# Patient Record
Sex: Female | Born: 1945
Health system: Southern US, Community
[De-identification: ages and names within clinical notes are randomized; demographics above are authoritative.]

## PROBLEM LIST (undated history)

## (undated) DIAGNOSIS — F0280 Dementia in other diseases classified elsewhere without behavioral disturbance: Secondary | ICD-10-CM

## (undated) DIAGNOSIS — H3410 Central retinal artery occlusion, unspecified eye: Secondary | ICD-10-CM

## (undated) DIAGNOSIS — S43429A Sprain of unspecified rotator cuff capsule, initial encounter: Secondary | ICD-10-CM

## (undated) DIAGNOSIS — D649 Anemia, unspecified: Secondary | ICD-10-CM

## (undated) DIAGNOSIS — Z789 Other specified health status: Secondary | ICD-10-CM

## (undated) DIAGNOSIS — H269 Unspecified cataract: Secondary | ICD-10-CM

## (undated) HISTORY — PX: OTHER SURGICAL HISTORY: SHX169

## (undated) HISTORY — PX: ANKLE SURGERY: SHX546

## (undated) HISTORY — DX: Central retinal artery occlusion, unspecified eye: H34.10

## (undated) HISTORY — DX: Unspecified cataract: H26.9

---

## 2004-02-14 ENCOUNTER — Ambulatory Visit (HOSPITAL_COMMUNITY): Admission: RE | Admit: 2004-02-14 | Discharge: 2004-02-14 | Payer: Self-pay | Admitting: General Surgery

## 2011-01-18 ENCOUNTER — Other Ambulatory Visit (HOSPITAL_COMMUNITY): Payer: Self-pay | Admitting: Family Medicine

## 2011-01-18 DIAGNOSIS — Z139 Encounter for screening, unspecified: Secondary | ICD-10-CM

## 2011-01-22 ENCOUNTER — Ambulatory Visit (HOSPITAL_COMMUNITY): Payer: Self-pay

## 2011-01-29 ENCOUNTER — Other Ambulatory Visit (HOSPITAL_COMMUNITY): Payer: Self-pay | Admitting: Family Medicine

## 2011-01-29 DIAGNOSIS — L02619 Cutaneous abscess of unspecified foot: Secondary | ICD-10-CM

## 2011-01-29 DIAGNOSIS — L03119 Cellulitis of unspecified part of limb: Secondary | ICD-10-CM

## 2011-01-31 ENCOUNTER — Inpatient Hospital Stay (HOSPITAL_COMMUNITY): Admission: RE | Admit: 2011-01-31 | Payer: Self-pay | Source: Ambulatory Visit

## 2013-12-17 ENCOUNTER — Emergency Department (HOSPITAL_COMMUNITY)
Admission: EM | Admit: 2013-12-17 | Discharge: 2013-12-17 | Disposition: A | Discharge status: Home / Self Care | Payer: 59 | Attending: Emergency Medicine | Admitting: Emergency Medicine

## 2013-12-17 ENCOUNTER — Encounter (HOSPITAL_COMMUNITY): Payer: Self-pay | Admitting: Emergency Medicine

## 2013-12-17 DIAGNOSIS — Z87828 Personal history of other (healed) physical injury and trauma: Secondary | ICD-10-CM | POA: Insufficient documentation

## 2013-12-17 DIAGNOSIS — M25571 Pain in right ankle and joints of right foot: Secondary | ICD-10-CM

## 2013-12-17 DIAGNOSIS — M25579 Pain in unspecified ankle and joints of unspecified foot: Secondary | ICD-10-CM | POA: Insufficient documentation

## 2013-12-17 MED ORDER — OXYCODONE-ACETAMINOPHEN 5-325 MG PO TABS: 1.0000 | ORAL_TABLET | ORAL | Status: DC | PRN

## 2013-12-17 MED ORDER — IBUPROFEN 800 MG PO TABS
800.0000 mg | ORAL_TABLET | Freq: Once | ORAL | Status: AC
  Administered 2013-12-17: 800 mg via ORAL
  Filled 2013-12-17: qty 1

## 2013-12-17 MED ORDER — OXYCODONE-ACETAMINOPHEN 5-325 MG PO TABS
2.0000 | ORAL_TABLET | Freq: Once | ORAL | Status: AC
  Administered 2013-12-17: 2 via ORAL
  Filled 2013-12-17: qty 2

## 2013-12-17 NOTE — ED Notes (Signed)
Pt reports she broke her ankle a couple of years ago, has trouble off and on with it when she is on her feet a lot, states she was on her feet all day Monday and thinks that is why she is having pain and increased swelling

## 2013-12-27 NOTE — ED Provider Notes (Signed)
CSN: [id number]     Arrival date & time 12/17/13  0425 History   First MD Initiated Contact with Patient 12/17/13 0429     No chief complaint on file.    (Consider location/radiation/quality/duration/timing/severity/associated sxs/prior Treatment) HPI  C69-year-old female with right ankle pain. Previously broken a few years ago. She occasionally has increased pain in the same ankle which is on her feet for extended periods of time. She was on her feet for extended period of time on Monday and she thinks this may have exacerbated things. No rash. No fevers or chills. No acute numbness or tingling.  History reviewed. No pertinent past medical history. History reviewed. No pertinent past surgical history. No family history on file. History  Substance Use Topics  . Smoking status: Never Smoker   . Smokeless tobacco: Not on file  . Alcohol Use: No   OB History   Grav Para Term Preterm Abortions TAB SAB Ect Mult Living                 Review of Systems  All systems reviewed and negative, other than as noted in HPI.   Allergies  Review of patient's allergies indicates no known allergies.  Home Medications   Prior to Admission medications   Medication Sig Start Date End Date Taking? Authorizing Provider  oxyCODONE-acetaminophen (PERCOCET/ROXICET) 5-325 MG per tablet Take 1-2 tablets by mouth every 4 (four) hours as needed for severe pain. 12/17/13   Raeford Razor, MD   BP 121/55  Pulse 69  Resp 18  SpO2 100% Physical Exam  Nursing note and vitals reviewed. Constitutional: She appears well-developed and well-nourished. No distress.  HENT:  Head: Normocephalic and atraumatic.  Eyes: Conjunctivae are normal. Right eye exhibits no discharge. Left eye exhibits no discharge.  Neck: Neck supple.  Cardiovascular: Normal rate, regular rhythm and normal heart sounds.  Exam reveals no gallop and no friction rub.   No murmur heard. Pulmonary/Chest: Effort normal and breath sounds normal.  No respiratory distress.  Abdominal: Soft. She exhibits no distension. There is no tenderness.  Musculoskeletal: She exhibits no edema and no tenderness.  R foot/ankle with no discrete areas of tenderness. Able to actively and passively ranged ankle without apparent significant pain. Sensation is intact to light touch. Easily palpable DP pulse. Skin is warm and appears well perfused. Mild diffuse swelling of R foot/ankle but otherwse no skin changes to suggest infection.  Neurological: She is alert.  Skin: Skin is warm and dry.  Psychiatric: She has a normal mood and affect. Her behavior is normal. Thought content normal.    ED Course  Procedures (including critical care time) Labs Review Labs Reviewed - No data to display  Imaging Review No results found.   EKG Interpretation None      MDM   Final diagnoses:  Right ankle pain    68 year old female with right ankle pain. Suspect from overuse. Previous ankle injury, but no acute trauma. NVI. Plan symptomatic treatment.  Raeford Razor, MD 12/27/13 [phone]

## 2014-02-01 ENCOUNTER — Emergency Department (HOSPITAL_COMMUNITY)
Admission: EM | Admit: 2014-02-01 | Discharge: 2014-02-01 | Disposition: A | Discharge status: Home / Self Care | Payer: 59 | Attending: Emergency Medicine | Admitting: Emergency Medicine

## 2014-02-01 ENCOUNTER — Encounter (HOSPITAL_COMMUNITY): Payer: Self-pay | Admitting: Emergency Medicine

## 2014-02-01 DIAGNOSIS — Z8781 Personal history of (healed) traumatic fracture: Secondary | ICD-10-CM | POA: Insufficient documentation

## 2014-02-01 DIAGNOSIS — Z9889 Other specified postprocedural states: Secondary | ICD-10-CM | POA: Insufficient documentation

## 2014-02-01 DIAGNOSIS — M25471 Effusion, right ankle: Secondary | ICD-10-CM

## 2014-02-01 DIAGNOSIS — M25476 Effusion, unspecified foot: Principal | ICD-10-CM | POA: Insufficient documentation

## 2014-02-01 DIAGNOSIS — M25473 Effusion, unspecified ankle: Secondary | ICD-10-CM | POA: Insufficient documentation

## 2014-02-01 MED ORDER — TRAMADOL HCL 50 MG PO TABS: 50.0000 mg | ORAL_TABLET | Freq: Four times a day (QID) | ORAL | Status: DC | PRN

## 2014-02-01 NOTE — ED Provider Notes (Addendum)
CSN: [id number]     Arrival date & time 02/01/14  4388 History   First MD Initiated Contact with Patient 02/01/14 [phone]     Chief Complaint  Patient presents with  . Right Ankle Swelling      (Consider location/radiation/quality/duration/timing/severity/associated sxs/prior Treatment) HPI Comments: The pt is a 68 y/o female who has had swelling intermittently in her R ankle since fracturing the ankle and resultant surgery in the past - she was seen a month ago for atraumatic swelling - she is back today because 3 days ago the sweling came back - she has a job that requires that she be on her feet all day - she has no fevers, no redness and denies injury - she states this always occurs - she has been using a compression sock without relief and ibuprofen without relief.  The history is provided by the patient.    History reviewed. No pertinent past medical history. History reviewed. No pertinent past surgical history. History reviewed. No pertinent family history. History  Substance Use Topics  . Smoking status: Never Smoker   . Smokeless tobacco: Not on file  . Alcohol Use: No   OB History   Grav Para Term Preterm Abortions TAB SAB Ect Mult Living                 Review of Systems  Gastrointestinal: Negative for nausea and vomiting.  Musculoskeletal: Positive for joint swelling ( ankle). Negative for back pain and neck pain.  Neurological: Negative for weakness and numbness.      Allergies  Review of patient's allergies indicates no known allergies.  Home Medications   Prior to Admission medications   Medication Sig Start Date End Date Taking? Authorizing Provider  oxyCODONE-acetaminophen (PERCOCET/ROXICET) 5-325 MG per tablet Take 1-2 tablets by mouth every 4 (four) hours as needed for severe pain. 12/17/13   Raeford Razor, MD  traMADol (ULTRAM) 50 MG tablet Take 1 tablet (50 mg total) by mouth every 6 (six) hours as needed. 02/01/14   Vida Roller, MD   BP 114/55  Pulse  71  Temp(Src) 97.1 F (36.2 C) (Oral)  Resp 16  Ht 5\' 1"  (1.549 m)  Wt 147 lb (66.679 kg)  BMI 27.79 kg/m2  SpO2 96% Physical Exam  Nursing note and vitals reviewed. Constitutional: She appears well-developed and well-nourished. No distress.  HENT:  Head: Normocephalic and atraumatic.  Eyes: Conjunctivae are normal. No scleral icterus.  Cardiovascular: Normal rate, regular rhythm and intact distal pulses.   Pulmonary/Chest: Effort normal and breath sounds normal.  Musculoskeletal: She exhibits tenderness ( ttp around the R ankle and across the top of the foot. with assocaited mild swelling compared to L foot and ankle). She exhibits no edema.  Neurological: She is alert.  Skin: Skin is warm and dry. No rash noted. She is not diaphoretic.    ED Course  Procedures (including critical care time) Labs Review Labs Reviewed - No data to display  Imaging Review No results found.    MDM   Final diagnoses:  Ankle swelling, right    Ankle unlikely to be fractured - ongoing swelling - ASO applied - RICE therapy recommended for home and to stay off of foot X 24 hours - ultram to augment the nsaids.  Pt in agreement.  Meds given in ED:  Medications - No data to display  New Prescriptions   TRAMADOL (ULTRAM) 50 MG TABLET    Take 1 tablet (50 mg total) by mouth  every 6 (six) hours as needed.      Vida RollerBrian D Jahden Schara, MD 02/01/14 [phone]  Vida RollerBrian D Jacy Howat, MD 02/01/14 [phone]

## 2014-02-01 NOTE — ED Notes (Signed)
Pt has c/o of right ankle swelling since Friday.

## 2014-02-01 NOTE — Discharge Instructions (Signed)
Please stay off your feet for 24 hours as much as possible - see RICE instructions.  Tramadol for pain not relieved with ibuprofen.  Please call your doctor for a followup appointment within 24-48 hours. When you talk to your doctor please let them know that you were seen in the emergency department and have them acquire all of your records so that they can discuss the findings with you and formulate a treatment plan to fully care for your new and ongoing problems.

## 2016-08-10 ENCOUNTER — Encounter (HOSPITAL_COMMUNITY): Payer: Self-pay | Admitting: *Deleted

## 2016-08-10 ENCOUNTER — Emergency Department (HOSPITAL_COMMUNITY)
Admission: EM | Admit: 2016-08-10 | Discharge: 2016-08-10 | Disposition: A | Discharge status: Home / Self Care | Payer: 59 | Attending: Emergency Medicine | Admitting: Emergency Medicine

## 2016-08-10 ENCOUNTER — Emergency Department (HOSPITAL_COMMUNITY): Payer: 59

## 2016-08-10 DIAGNOSIS — J208 Acute bronchitis due to other specified organisms: Secondary | ICD-10-CM | POA: Diagnosis not present

## 2016-08-10 DIAGNOSIS — R079 Chest pain, unspecified: Secondary | ICD-10-CM | POA: Diagnosis not present

## 2016-08-10 DIAGNOSIS — R05 Cough: Secondary | ICD-10-CM | POA: Diagnosis not present

## 2016-08-10 MED ORDER — ALBUTEROL SULFATE HFA 108 (90 BASE) MCG/ACT IN AERS
2.0000 | INHALATION_SPRAY | Freq: Once | RESPIRATORY_TRACT | Status: AC
  Administered 2016-08-10: 2 via RESPIRATORY_TRACT
  Filled 2016-08-10: qty 6.7

## 2016-08-10 MED ORDER — DOXYCYCLINE HYCLATE 100 MG PO TABS
100.0000 mg | ORAL_TABLET | Freq: Once | ORAL | Status: AC
  Administered 2016-08-10: 100 mg via ORAL
  Filled 2016-08-10: qty 1

## 2016-08-10 MED ORDER — HYDROCOD POLST-CPM POLST ER 10-8 MG/5ML PO SUER
5.0000 mL | Freq: Once | ORAL | Status: AC
  Administered 2016-08-10: 5 mL via ORAL
  Filled 2016-08-10: qty 5

## 2016-08-10 MED ORDER — DOXYCYCLINE HYCLATE 100 MG PO CAPS: 100.0000 mg | ORAL_CAPSULE | Freq: Two times a day (BID) | ORAL | 0 refills | Status: DC

## 2016-08-10 MED ORDER — HYDROCODONE-HOMATROPINE 5-1.5 MG/5ML PO SYRP: 5.0000 mL | ORAL_SOLUTION | Freq: Four times a day (QID) | ORAL | 0 refills | Status: DC | PRN

## 2016-08-10 NOTE — Discharge Instructions (Signed)
Your chest xray suggest bronchitis, but no pneumonia. Your oxygen level is 100%. Please increase fluids. Rest as much as possible. Use 2 puffs of albuterol every 4 hours for cough, wheezing, and congestion. Use doxycycline 2 times daily with a meal. Use hycodan for cough. This medication may cause drowsiness. Use with caution. Use tylenol, and/or ibuprofen for shoulder pain. Heating pad to the shoulder may also be helpful.

## 2016-08-10 NOTE — ED Triage Notes (Signed)
Pt comes in with cough and congestion starting 3 days ago. Pt states she has a productive cough with green mucus. Pt is having right shoulder pain that started the day after the cough. Pain worsens with movement. Denies any chest pain.

## 2016-08-10 NOTE — ED Provider Notes (Signed)
[facility]EMER[facility]ENCY DEPT Provider Note   CSN: [id number] Arrival date & time: 08/10/16  6629     History   Chief Complaint Chief Complaint  Patient presents with  . Cough    HPI Wendy Mccann is a 71 y.o. female.  Patient is a 71 year old female who presents to the emergency department with a complaint of cough.  The patient reports a three-day history of cough and congestion. Her cough is productive, usually with greenish mucus. Patient states she has coughed so much that she now has pain involving her right shoulder. She presents today because the pain is getting worse, and the cough does not seem to be getting better she has tried some over-the-counter medications, but these have not been effective. She's not measured a temperature elevation, she has had a few body aches on. She has not been coughing up any blood. There's been no recent operations or procedures involving her lungs. She is not a smoker, and does not have a lung related illness that she is aware of.      History reviewed. No pertinent past medical history.  There are no active problems to display for this patient.   History reviewed. No pertinent surgical history.  OB History    No data available       Home Medications    Prior to Admission medications   Medication Sig Start Date End Date Taking? Authorizing Provider  oxyCODONE-acetaminophen (PERCOCET/ROXICET) 5-325 MG per tablet Take 1-2 tablets by mouth every 4 (four) hours as needed for severe pain. 12/17/13   Raeford Razor, MD  traMADol (ULTRAM) 50 MG tablet Take 1 tablet (50 mg total) by mouth every 6 (six) hours as needed. 02/01/14   Eber Hong, MD    Family History No family history on file.  Social History Social History  Substance Use Topics  . Smoking status: Never Smoker  . Smokeless tobacco: Never Used  . Alcohol use No     Allergies   Patient has no known allergies.   Review of Systems Review of Systems  Constitutional:  Negative for activity change.       All ROS Neg except as noted in HPI  HENT: Positive for congestion. Negative for nosebleeds.   Eyes: Negative for photophobia and discharge.  Respiratory: Positive for cough. Negative for shortness of breath and wheezing.   Cardiovascular: Negative for chest pain and palpitations.  Gastrointestinal: Negative for abdominal pain and blood in stool.  Genitourinary: Negative for dysuria, frequency and hematuria.  Musculoskeletal: Positive for myalgias. Negative for arthralgias, back pain and neck pain.  Skin: Negative.   Neurological: Negative for dizziness, seizures and speech difficulty.  Psychiatric/Behavioral: Negative for confusion and hallucinations.     Physical Exam Updated Vital Signs BP 122/55 (BP Location: Left Arm)   Pulse 69   Temp 97.9 F (36.6 C) (Oral)   Resp 20   Ht 5\' 1"  (1.549 m)   Wt 55.3 kg   SpO2 98%   BMI 23.05 kg/m   Physical Exam  Constitutional: She is oriented to person, place, and time. She appears well-developed and well-nourished.  Non-toxic appearance.  HENT:  Head: Normocephalic.  Right Ear: Tympanic membrane and external ear normal.  Left Ear: Tympanic membrane and external ear normal.  Eyes: EOM and lids are normal. Pupils are equal, round, and reactive to light.  Neck: Normal range of motion. Neck supple. Carotid bruit is not present.  Cardiovascular: Normal rate, regular rhythm, normal heart sounds, intact distal pulses and  normal pulses.   Pulmonary/Chest: Breath sounds normal. No respiratory distress.  Course breath sounds with occasional rhonchi present. Symmetrical rise and fall of the chest. Patient speaks in complete sentences without problem.  Abdominal: Soft. Bowel sounds are normal. There is no tenderness. There is no guarding.  Musculoskeletal: Normal range of motion.  Lymphadenopathy:       Head (right side): No submandibular adenopathy present.       Head (left side): No submandibular adenopathy  present.    She has no cervical adenopathy.  Neurological: She is alert and oriented to person, place, and time. She has normal strength. No cranial nerve deficit or sensory deficit.  Skin: Skin is warm and dry.  Psychiatric: She has a normal mood and affect. Her speech is normal.  Nursing note and vitals reviewed.    ED Treatments / Results  Labs (all labs ordered are listed, but only abnormal results are displayed) Labs Reviewed - No data to display  EKG  EKG Interpretation None       Radiology Dg Chest 2 View  Result Date: 08/10/2016 CLINICAL DATA:  Cough, right shoulder and posterior chest pain for 4 days EXAM: CHEST  2 VIEW COMPARISON:  None. FINDINGS: Heart and mediastinal contours are within normal limits. No focal opacities or effusions. No acute bony abnormality. Mild peribronchial thickening IMPRESSION: Mild bronchitic changes. Electronically Signed   By: Charlett Nose M.D.   On: 08/10/2016 09:35    Procedures Procedures (including critical care time)  Medications Ordered in ED Medications - No data to display   Initial Impression / Assessment and Plan / ED Course  I have reviewed the triage vital signs and the nursing notes.  Pertinent labs & imaging results that were available during my care of the patient were reviewed by me and considered in my medical decision making (see chart for details).     **I have reviewed nursing notes, vital signs, and all appropriate lab and imaging results for this patient.*  Final Clinical Impressions(s) / ED Diagnoses MDM Vital signs within normal limits. Pulse oximetry is 100% on room air. No high fevers reported. No history of pulmonary illness. The chest x-ray shows some bronchitis changes, but no opacity or pneumonia. Suspect the patient has acute bronchitis and possible upper respiratory infection. The patient will be treated with doxycycline, Hycodan, and albuterol inhaler. I've advised patient to wash hands frequently, as  well as to increase by mouth fluids. The patient is in agreement with this plan. She will return if not improving.    Final diagnoses:  Acute bronchitis due to other specified organisms    New Prescriptions New Prescriptions   No medications on file     Ivery Quale, PA-C 08/10/16 1119    Donnetta Hutching, MD 08/13/16 [phone]

## 2016-08-12 ENCOUNTER — Encounter (HOSPITAL_COMMUNITY): Payer: Self-pay | Admitting: Emergency Medicine

## 2016-08-12 ENCOUNTER — Observation Stay (HOSPITAL_COMMUNITY)
Admission: EM | Admit: 2016-08-12 | Discharge: 2016-08-13 | Disposition: A | Discharge status: Home / Self Care | Payer: 59 | Attending: Internal Medicine | Admitting: Internal Medicine

## 2016-08-12 ENCOUNTER — Emergency Department (HOSPITAL_COMMUNITY): Payer: 59

## 2016-08-12 DIAGNOSIS — Y999 Unspecified external cause status: Secondary | ICD-10-CM | POA: Insufficient documentation

## 2016-08-12 DIAGNOSIS — R109 Unspecified abdominal pain: Secondary | ICD-10-CM | POA: Diagnosis not present

## 2016-08-12 DIAGNOSIS — R059 Cough, unspecified: Secondary | ICD-10-CM | POA: Diagnosis present

## 2016-08-12 DIAGNOSIS — S46911A Strain of unspecified muscle, fascia and tendon at shoulder and upper arm level, right arm, initial encounter: Secondary | ICD-10-CM | POA: Diagnosis not present

## 2016-08-12 DIAGNOSIS — R531 Weakness: Secondary | ICD-10-CM

## 2016-08-12 DIAGNOSIS — R1030 Lower abdominal pain, unspecified: Secondary | ICD-10-CM | POA: Diagnosis not present

## 2016-08-12 DIAGNOSIS — E860 Dehydration: Secondary | ICD-10-CM | POA: Diagnosis present

## 2016-08-12 DIAGNOSIS — Z79899 Other long term (current) drug therapy: Secondary | ICD-10-CM | POA: Diagnosis not present

## 2016-08-12 DIAGNOSIS — Y929 Unspecified place or not applicable: Secondary | ICD-10-CM | POA: Insufficient documentation

## 2016-08-12 DIAGNOSIS — S4991XA Unspecified injury of right shoulder and upper arm, initial encounter: Secondary | ICD-10-CM | POA: Diagnosis present

## 2016-08-12 DIAGNOSIS — X58XXXA Exposure to other specified factors, initial encounter: Secondary | ICD-10-CM | POA: Insufficient documentation

## 2016-08-12 DIAGNOSIS — I951 Orthostatic hypotension: Secondary | ICD-10-CM | POA: Diagnosis present

## 2016-08-12 DIAGNOSIS — Y9389 Activity, other specified: Secondary | ICD-10-CM | POA: Insufficient documentation

## 2016-08-12 DIAGNOSIS — B349 Viral infection, unspecified: Secondary | ICD-10-CM | POA: Diagnosis not present

## 2016-08-12 DIAGNOSIS — R05 Cough: Secondary | ICD-10-CM | POA: Diagnosis not present

## 2016-08-12 DIAGNOSIS — R52 Pain, unspecified: Secondary | ICD-10-CM

## 2016-08-12 DIAGNOSIS — M25511 Pain in right shoulder: Secondary | ICD-10-CM | POA: Diagnosis not present

## 2016-08-12 HISTORY — DX: Other specified health status: Z78.9

## 2016-08-12 LAB — URINALYSIS, ROUTINE W REFLEX MICROSCOPIC
Bilirubin Urine: NEGATIVE
Glucose, UA: NEGATIVE mg/dL
Ketones, ur: 20 mg/dL — AB
Leukocytes, UA: NEGATIVE
Nitrite: NEGATIVE
Protein, ur: NEGATIVE mg/dL
Specific Gravity, Urine: 1.012 (ref 1.005–1.030)
pH: 6 (ref 5.0–8.0)

## 2016-08-12 LAB — CBC WITH DIFFERENTIAL/PLATELET
Basophils Absolute: 0 10*3/uL (ref 0.0–0.1)
Basophils Relative: 0 %
Eosinophils Absolute: 0 10*3/uL (ref 0.0–0.7)
Eosinophils Relative: 0 %
HCT: 37.2 % (ref 36.0–46.0)
Hemoglobin: 12.5 g/dL (ref 12.0–15.0)
Lymphocytes Relative: 17 %
Lymphs Abs: 1.8 10*3/uL (ref 0.7–4.0)
MCH: 32.4 pg (ref 26.0–34.0)
MCHC: 33.6 g/dL (ref 30.0–36.0)
MCV: 96.4 fL (ref 78.0–100.0)
Monocytes Absolute: 1.6 10*3/uL — ABNORMAL HIGH (ref 0.1–1.0)
Monocytes Relative: 15 %
Neutro Abs: 7.3 10*3/uL (ref 1.7–7.7)
Neutrophils Relative %: 68 %
Platelets: 296 10*3/uL (ref 150–400)
RBC: 3.86 MIL/uL — ABNORMAL LOW (ref 3.87–5.11)
RDW: 12.5 % (ref 11.5–15.5)
WBC: 10.8 10*3/uL — ABNORMAL HIGH (ref 4.0–10.5)

## 2016-08-12 LAB — COMPREHENSIVE METABOLIC PANEL
ALT: 13 U/L — ABNORMAL LOW (ref 14–54)
AST: 19 U/L (ref 15–41)
Albumin: 3.8 g/dL (ref 3.5–5.0)
Alkaline Phosphatase: 68 U/L (ref 38–126)
Anion gap: 10 (ref 5–15)
BUN: 10 mg/dL (ref 6–20)
CO2: 26 mmol/L (ref 22–32)
Calcium: 9.8 mg/dL (ref 8.9–10.3)
Chloride: 97 mmol/L — ABNORMAL LOW (ref 101–111)
Creatinine, Ser: 0.74 mg/dL (ref 0.44–1.00)
GFR calc Af Amer: >60 mL/min (ref >60)
GFR calc non Af Amer: >60 mL/min (ref >60)
Glucose, Bld: 103 mg/dL — ABNORMAL HIGH (ref 65–99)
Potassium: 3.6 mmol/L (ref 3.5–5.1)
Sodium: 133 mmol/L — ABNORMAL LOW (ref 135–145)
Total Bilirubin: 1.8 mg/dL — ABNORMAL HIGH (ref 0.3–1.2)
Total Protein: 8 g/dL (ref 6.5–8.1)

## 2016-08-12 LAB — LIPASE, BLOOD: Lipase: 20 U/L (ref 11–51)

## 2016-08-12 LAB — LACTIC ACID, PLASMA
Lactic Acid, Venous: 0.8 mmol/L (ref 0.5–1.9)
Lactic Acid, Venous: 1 mmol/L (ref 0.5–1.9)

## 2016-08-12 LAB — INFLUENZA PANEL BY PCR (TYPE A & B)
Influenza A By PCR: NEGATIVE
Influenza B By PCR: NEGATIVE

## 2016-08-12 LAB — TROPONIN I: Troponin I: <0.03 ng/mL (ref <0.03)

## 2016-08-12 MED ORDER — IOPAMIDOL (ISOVUE-300) INJECTION 61%
100.0000 mL | Freq: Once | INTRAVENOUS | Status: AC | PRN
  Administered 2016-08-12: 100 mL via INTRAVENOUS

## 2016-08-12 MED ORDER — SODIUM CHLORIDE 0.9 % IV BOLUS (SEPSIS)
500.0000 mL | Freq: Once | INTRAVENOUS | Status: AC
  Administered 2016-08-12: 500 mL via INTRAVENOUS

## 2016-08-12 MED ORDER — ONDANSETRON HCL 4 MG/2ML IJ SOLN
4.0000 mg | INTRAMUSCULAR | Status: DC | PRN
  Administered 2016-08-12: 4 mg via INTRAVENOUS
  Filled 2016-08-12 (×2): qty 2

## 2016-08-12 MED ORDER — SODIUM CHLORIDE 0.9 % IV BOLUS (SEPSIS)
1000.0000 mL | Freq: Once | INTRAVENOUS | Status: AC
  Administered 2016-08-12: 1000 mL via INTRAVENOUS

## 2016-08-12 MED ORDER — ALBUTEROL SULFATE HFA 108 (90 BASE) MCG/ACT IN AERS
4.0000 | INHALATION_SPRAY | RESPIRATORY_TRACT | Status: AC
  Administered 2016-08-12: 4 via RESPIRATORY_TRACT
  Filled 2016-08-12: qty 6.7

## 2016-08-12 MED ORDER — MORPHINE SULFATE (PF) 4 MG/ML IV SOLN
4.0000 mg | INTRAVENOUS | Status: DC | PRN
  Administered 2016-08-12: 4 mg via INTRAVENOUS
  Filled 2016-08-12 (×2): qty 1

## 2016-08-12 NOTE — ED Provider Notes (Signed)
[facility]PT Provider Note   CSN: [id number] Arrival date & time: 08/12/16  1538     History   Chief Complaint Chief Complaint  Patient presents with  . Shoulder Pain  . Abdominal Pain    HPI Wendy Mccann is a 71 y.o. female.   Shoulder Pain    Abdominal Pain      Pt was seen at 1550.  Per pt, c/o gradual onset and persistence of constant lower abd "pain" for the past 2 to 3 days. Has been associated with generalized weakness. Pt also c/o ongoing cough, congestion and right shoulder "pain" for the past 1 week. Pt states her shoulder pain began after "coughing so much." Pain worsens with palpation of the area and movement of her shoulder. Pt is right handed. Pt was evaluated in the ED 2 days ago for these symptoms, dx URI, rx MDI with improvement of cough. Pt states her shoulder pain continues despite taking the pain meds prescribed. Denies N/V, no diarrhea, no fevers, no rash, no neck pain, no back pain, no dysuria, no rash, no CP/SOB, no black or blood in stools or emesis.      History reviewed. No pertinent past medical history.  There are no active problems to display for this patient.   Past Surgical History:  Procedure Laterality Date  . ankles      OB History    Gravida Para Term Preterm AB Living   0 0 0 0 0 0   SAB TAB Ectopic Multiple Live Births   0 0 0 0 0       Home Medications    Prior to Admission medications   Medication Sig Start Date End Date Taking? Authorizing Provider  doxycycline (VIBRAMYCIN) 100 MG capsule Take 1 capsule (100 mg total) by mouth 2 (two) times daily. 08/10/16   Ivery QualeHobson Bryant, PA-C  HYDROcodone-homatropine (HYCODAN) 5-1.5 MG/5ML syrup Take 5 mLs by mouth every 6 (six) hours as needed. 08/10/16   Ivery QualeHobson Bryant, PA-C  oxyCODONE-acetaminophen (PERCOCET/ROXICET) 5-325 MG per tablet Take 1-2 tablets by mouth every 4 (four) hours as needed for severe pain. 12/17/13   Raeford RazorStephen Kohut, MD  traMADol (ULTRAM) 50 MG tablet Take 1  tablet (50 mg total) by mouth every 6 (six) hours as needed. 02/01/14   Eber HongBrian Miller, MD    Family History History reviewed. No pertinent family history.  Social History Social History  Substance Use Topics  . Smoking status: Never Smoker  . Smokeless tobacco: Never Used  . Alcohol use No     Allergies   Patient has no known allergies.   Review of Systems Review of Systems  Gastrointestinal: Positive for abdominal pain.  ROS: Statement: All systems negative except as marked or noted in the HPI; Constitutional: Negative for fever and chills. +generalized weakness. ; ; Eyes: Negative for eye pain, redness and discharge. ; ; ENMT: Negative for ear pain, hoarseness, nasal congestion, sinus pressure and sore throat. ; ; Cardiovascular: Negative for chest pain, palpitations, diaphoresis, dyspnea and peripheral edema. ; ; Respiratory: +cough. Negative for wheezing and stridor. ; ; Gastrointestinal: +abd pain. Negative for nausea, vomiting, diarrhea, blood in stool, hematemesis, jaundice and rectal bleeding. . ; ; Genitourinary: Negative for dysuria, flank pain and hematuria. ; ; Musculoskeletal: +right shoulder pain. Negative for back pain and neck pain. Negative for swelling and direct trauma.; ; Skin: Negative for pruritus, rash, abrasions, blisters, bruising and skin lesion.; ; Neuro: Negative for headache, lightheadedness and neck stiffness. Negative for  weakness, altered level of consciousness, altered mental status, extremity weakness, paresthesias, involuntary movement, seizure and syncope.      Physical Exam Updated Vital Signs BP (!) 117/52 (BP Location: Left Arm)   Pulse 84   Temp 98.1 F (36.7 C) (Oral)   Resp 16   Ht 5\' 1"  (1.549 m)   Wt 122 lb (55.3 kg)   SpO2 100%   BMI 23.05 kg/m    Patient Vitals for the past 24 hrs:  BP Temp Temp src Pulse Resp SpO2 Height Weight  08/12/16 2122 - - - - - 97 % - -  08/12/16 2045 - - - 81 16 93 % - -  08/12/16 2030 113/69 - - 83 15  93 % - -  08/12/16 2015 - - - 78 13 98 % - -  08/12/16 2000 118/66 - - - 20 - - -  08/12/16 1930 (!) 95/53 - - 87 20 94 % - -  08/12/16 1913 118/71 - - 85 18 93 % - -  08/12/16 1845 - - - 85 19 92 % - -  08/12/16 1830 - - - 87 24 94 % - -  08/12/16 1815 - - - 82 - 100 % - -  08/12/16 1745 - - - - 20 - - -  08/12/16 1730 - - - 82 19 100 % - -  08/12/16 1715 - - - 84 18 97 % - -  08/12/16 1645 - - - 81 23 97 % - -  08/12/16 1630 - - - 80 22 96 % - -  08/12/16 1541 (!) 117/52 98.1 F (36.7 C) Oral 84 16 100 % 5\' 1"  (1.549 m) 122 lb (55.3 kg)   17:14 Orthostatic Vital Signs Intiial DF  Orthostatic Lying   BP- Lying: 111/61  Pulse- Lying: 75      Orthostatic Sitting  BP- Sitting: 117/68  Pulse- Sitting: 82      Orthostatic Standing at 0 minutes  BP- Standing at 0 minutes: 95/66  Pulse- Standing at 0 minutes: 88   19:50 Orthostatic Vital Signs after IVF bolus AH  Orthostatic Lying   BP- Lying: 102/48  Pulse- Lying: 81      Orthostatic Sitting  BP- Sitting:  85/74  Pulse- Sitting: 86      Orthostatic Standing at 0 minutes  BP- Standing at 0 minutes: 99/77  Pulse- Standing at 0 minutes: 85      Physical Exam 1555: Physical examination:  Nursing notes reviewed; Vital signs and O2 SAT reviewed;  Constitutional: Well developed, Well nourished, Uncomfortable appearing.; Head:  Normocephalic, atraumatic; Eyes: EOMI, PERRL, No scleral icterus; ENMT: Mouth and pharynx normal, Mucous membranes dry; Neck: Supple, Full range of motion, No lymphadenopathy; Cardiovascular: Regular rate and rhythm, No gallop; Respiratory: Breath sounds clear & equal bilaterally, No wheezes.  Speaking full sentences with ease, Normal respiratory effort/excursion; Chest: Nontender, Movement normal; Abdomen: Soft, +RLQ, LLQ tender to palp. Nondistended, Normal bowel sounds; Genitourinary: No CVA tenderness; Spine:  No midline CS, TS, LS tenderness. +TTP right hypertonic trapezius muscle. No rash.;;  Extremities: Pulses normal, Right shoulder w/decreased ROM due to pain.  +TTP entire shoulder, esp AC joint. Right clavicle NT, scapula NT, proximal humerus NT, biceps tendon NT over bicipital groove. No deformity, no rash, no edema.  Motor strength at shoulder normal.  Sensation intact over deltoid region, distal NMS intact with right hand having intact and equal sensation and strength in the distribution of the median, radial, and ulnar  nerve function compared to opposite side.  Strong radial pulse.  +FROM right elbow with intact motor strength biceps and triceps muscles to resistance. NT right elbow/wrist/hand. No edema, No calf edema or asymmetry.; Neuro: AA&Ox3, Major CN grossly intact.  Speech clear. No gross focal motor or sensory deficits in extremities.; Skin: Color normal, Warm, Dry.   ED Treatments / Results  Labs (all labs ordered are listed, but only abnormal results are displayed)   EKG  EKG Interpretation None       Radiology   Procedures Procedures (including critical care time)  Medications Ordered in ED Medications - No data to display   Initial Impression / Assessment and Plan / ED Course  I have reviewed the triage vital signs and the nursing notes.  Pertinent labs & imaging results that were available during my care of the patient were reviewed by me and considered in my medical decision making (see chart for details).  MDM Reviewed: previous chart, nursing note and vitals Reviewed previous: labs, ECG and x-ray Interpretation: labs, ECG, x-ray and CT scan   Results for orders placed or performed during the hospital encounter of 08/12/16  Comprehensive metabolic panel  Result Value Ref Range   Sodium 133 (L) 135 - 145 mmol/L   Potassium 3.6 3.5 - 5.1 mmol/L   Chloride 97 (L) 101 - 111 mmol/L   CO2 26 22 - 32 mmol/L   Glucose, Bld 103 (H) 65 - 99 mg/dL   BUN 10 6 - 20 mg/dL   Creatinine, Ser 1.61 0.44 - 1.00 mg/dL   Calcium 9.8 8.9 - 09.6 mg/dL    Total Protein 8.0 6.5 - 8.1 g/dL   Albumin 3.8 3.5 - 5.0 g/dL   AST 19 15 - 41 U/L   ALT 13 (L) 14 - 54 U/L   Alkaline Phosphatase 68 38 - 126 U/L   Total Bilirubin 1.8 (H) 0.3 - 1.2 mg/dL   GFR calc non Af Amer >60 >60 mL/min   GFR calc Af Amer >60 >60 mL/min   Anion gap 10 5 - 15  Lipase, blood  Result Value Ref Range   Lipase 20 11 - 51 U/L  Troponin I  Result Value Ref Range   Troponin I <0.03 <0.03 ng/mL  Lactic acid, plasma  Result Value Ref Range   Lactic Acid, Venous 0.8 0.5 - 1.9 mmol/L  Lactic acid, plasma  Result Value Ref Range   Lactic Acid, Venous 1.0 0.5 - 1.9 mmol/L  CBC with Differential  Result Value Ref Range   WBC 10.8 (H) 4.0 - 10.5 K/uL   RBC 3.86 (L) 3.87 - 5.11 MIL/uL   Hemoglobin 12.5 12.0 - 15.0 g/dL   HCT 04.5 40.9 - 81.1 %   MCV 96.4 78.0 - 100.0 fL   MCH 32.4 26.0 - 34.0 pg   MCHC 33.6 30.0 - 36.0 g/dL   RDW 91.4 78.2 - 95.6 %   Platelets 296 150 - 400 K/uL   Neutrophils Relative % 68 %   Neutro Abs 7.3 1.7 - 7.7 K/uL   Lymphocytes Relative 17 %   Lymphs Abs 1.8 0.7 - 4.0 K/uL   Monocytes Relative 15 %   Monocytes Absolute 1.6 (H) 0.1 - 1.0 K/uL   Eosinophils Relative 0 %   Eosinophils Absolute 0.0 0.0 - 0.7 K/uL   Basophils Relative 0 %   Basophils Absolute 0.0 0.0 - 0.1 K/uL  Urinalysis, Routine w reflex microscopic  Result Value Ref Range   Color,  Urine YELLOW YELLOW   APPearance CLEAR CLEAR   Specific Gravity, Urine 1.012 1.005 - 1.030   pH 6.0 5.0 - 8.0   Glucose, UA NEGATIVE NEGATIVE mg/dL   Hgb urine dipstick SMALL (A) NEGATIVE   Bilirubin Urine NEGATIVE NEGATIVE   Ketones, ur 20 (A) NEGATIVE mg/dL   Protein, ur NEGATIVE NEGATIVE mg/dL   Nitrite NEGATIVE NEGATIVE   Leukocytes, UA NEGATIVE NEGATIVE   RBC / HPF 0-5 0 - 5 RBC/hpf   WBC, UA 0-5 0 - 5 WBC/hpf   Bacteria, UA RARE (A) NONE SEEN   Mucous PRESENT    Dg Chest 2 View Result Date: 08/12/2016 CLINICAL DATA:  Abdominal pain and cough.  Shoulder pain. EXAM: CHEST  2  VIEW COMPARISON:  None. FINDINGS: The heart size and mediastinal contours are within normal limits. Both lungs are clear. Mild to moderate thoracic spondylosis. No pleural effusion. IMPRESSION: 1.  No active cardiopulmonary disease is radiographically apparent. 2. Thoracic spondylosis. Electronically Signed   By: Gaylyn Rong M.D.   On: 08/12/2016 17:19   Dg Shoulder Right Result Date: 08/12/2016 CLINICAL DATA:  Patient with right shoulder pain, worse with movement. EXAM: RIGHT SHOULDER - 2+ VIEW COMPARISON:  Chest radiograph 08/10/2016. FINDINGS: There is no evidence of fracture or dislocation. There is no evidence of arthropathy or other focal bone abnormality. Soft tissues are unremarkable. IMPRESSION: No acute osseous abnormality. Electronically Signed   By: Annia Belt M.D.   On: 08/12/2016 17:18   Ct Abdomen Pelvis W Contrast Result Date: 08/12/2016 CLINICAL DATA:  Lower abdominal pain, cough and generalized body aches. EXAM: CT ABDOMEN AND PELVIS WITH CONTRAST TECHNIQUE: Multidetector CT imaging of the abdomen and pelvis was performed using the standard protocol following bolus administration of intravenous contrast. CONTRAST:  ISOVUE-300 IOPAMIDOL (ISOVUE-300) INJECTION 61% COMPARISON:  None. FINDINGS: Lower chest: Lung bases are within normal. Mild calcification over the left main and left anterior descending coronary artery is. Hepatobiliary: The liver, gallbladder and biliary tree are within normal. Pancreas: Within normal. Spleen: Within normal. Adrenals/Urinary Tract: Adrenal glands are normal. Kidneys are normal in size without hydronephrosis or nephrolithiasis. Ureters and bladder are within normal. Stomach/Bowel: Stomach and small bowel are within normal. Appendix is normal. Colon is unremarkable. Vascular/Lymphatic: Mild calcified plaque over the abdominal aorta. Remaining vascular structures are unremarkable. No significant adenopathy. Reproductive: Uterus and adnexa are within  normal. Other: No significant free fluid or focal inflammatory change. Musculoskeletal: Mild degenerate change of the spine and hips. Grade 1 anterolisthesis of L5 on S1. Disc disease at the L4-5 and L5-S1 levels. Bilateral L5 spondylolysis. IMPRESSION: No acute findings in the abdomen/ pelvis. Minimal atherosclerotic coronary artery disease. Aortic atherosclerosis. Grade 1 anterolisthesis of L5 on S1 due to bilateral L5 spondylolysis. Disc disease at the L4-5 and L5-S1 levels. Electronically Signed   By: Elberta Fortis M.D.   On: 08/12/2016 18:02    2130:  Pt orthostatic on VS; IVF bolus given. Orthostatics repeated: still c/o lightheadedness. Unable to ambulate without significant 2 person assist. Dx and testing d/w pt.  Questions answered.  Verb understanding, agreeable to admit. T/C to [facility]. Onalee Hua, case discussed, including:  HPI, pertinent PM/SHx, VS/PE, dx testing, ED course and treatment:  Agreeable to admit, requests to write temporary orders, obtain medical bed to team [facility].   Final Clinical Impressions(s) / ED Diagnoses   Final diagnoses:  None    New Prescriptions New Prescriptions   No medications on file  Samuel Jester, DO 08/15/16 1330

## 2016-08-12 NOTE — ED Notes (Signed)
Attempted to ambulate patient. Patient states that she was having a lot of pain in lower abdominal area. Took two person assist to get patient out of bed. Patient's gait unsteady, patient not able to fully  stand erect. Made EDP aware, patient assisted back to bed.

## 2016-08-12 NOTE — ED Notes (Signed)
Pt given a Sprite for fluid challenge.

## 2016-08-12 NOTE — ED Triage Notes (Signed)
Pt here Friday with r shoulder pain and congestion. Was given meds. Pt staes still having shoiulder pain, worse with movement and lower abd pain. lnbm today. Denies n/v/d. Stats phlegm better not as thick. Pt also states too weak to walk. A/o.pt slightly pale

## 2016-08-13 ENCOUNTER — Observation Stay (HOSPITAL_COMMUNITY): Payer: 59

## 2016-08-13 ENCOUNTER — Encounter (HOSPITAL_COMMUNITY): Payer: Self-pay

## 2016-08-13 DIAGNOSIS — S46911A Strain of unspecified muscle, fascia and tendon at shoulder and upper arm level, right arm, initial encounter: Secondary | ICD-10-CM | POA: Diagnosis not present

## 2016-08-13 DIAGNOSIS — I951 Orthostatic hypotension: Secondary | ICD-10-CM | POA: Diagnosis not present

## 2016-08-13 DIAGNOSIS — R109 Unspecified abdominal pain: Secondary | ICD-10-CM

## 2016-08-13 DIAGNOSIS — R531 Weakness: Secondary | ICD-10-CM

## 2016-08-13 DIAGNOSIS — R1030 Lower abdominal pain, unspecified: Secondary | ICD-10-CM

## 2016-08-13 DIAGNOSIS — E860 Dehydration: Secondary | ICD-10-CM | POA: Diagnosis not present

## 2016-08-13 DIAGNOSIS — R05 Cough: Secondary | ICD-10-CM

## 2016-08-13 DIAGNOSIS — B349 Viral infection, unspecified: Secondary | ICD-10-CM | POA: Diagnosis not present

## 2016-08-13 DIAGNOSIS — Z79899 Other long term (current) drug therapy: Secondary | ICD-10-CM | POA: Diagnosis not present

## 2016-08-13 LAB — BASIC METABOLIC PANEL
Anion gap: 7 (ref 5–15)
BUN: 8 mg/dL (ref 6–20)
CO2: 27 mmol/L (ref 22–32)
Calcium: 8.6 mg/dL — ABNORMAL LOW (ref 8.9–10.3)
Chloride: 103 mmol/L (ref 101–111)
Creatinine, Ser: 0.7 mg/dL (ref 0.44–1.00)
GFR calc Af Amer: >60 mL/min (ref >60)
GFR calc non Af Amer: >60 mL/min (ref >60)
Glucose, Bld: 104 mg/dL — ABNORMAL HIGH (ref 65–99)
Potassium: 4 mmol/L (ref 3.5–5.1)
Sodium: 137 mmol/L (ref 135–145)

## 2016-08-13 LAB — HEPATIC FUNCTION PANEL
ALT: 11 U/L — ABNORMAL LOW (ref 14–54)
AST: 15 U/L (ref 15–41)
Albumin: 2.8 g/dL — ABNORMAL LOW (ref 3.5–5.0)
Alkaline Phosphatase: 52 U/L (ref 38–126)
Bilirubin, Direct: 0.3 mg/dL (ref 0.1–0.5)
Indirect Bilirubin: 0.7 mg/dL (ref 0.3–0.9)
Total Bilirubin: 1 mg/dL (ref 0.3–1.2)
Total Protein: 6.1 g/dL — ABNORMAL LOW (ref 6.5–8.1)

## 2016-08-13 MED ORDER — CYCLOBENZAPRINE HCL 5 MG PO TABS
7.5000 mg | ORAL_TABLET | Freq: Three times a day (TID) | ORAL | Status: DC | PRN
  Filled 2016-08-13: qty 1.5

## 2016-08-13 MED ORDER — IBUPROFEN 600 MG PO TABS: 600.0000 mg | ORAL_TABLET | Freq: Four times a day (QID) | ORAL | 0 refills | Status: DC | PRN

## 2016-08-13 MED ORDER — ENSURE ENLIVE PO LIQD
237.0000 mL | Freq: Two times a day (BID) | ORAL | Status: DC
  Administered 2016-08-13: 237 mL via ORAL

## 2016-08-13 MED ORDER — POTASSIUM CHLORIDE IN NACL 20-0.9 MEQ/L-% IV SOLN
INTRAVENOUS | Status: DC
  Administered 2016-08-13 (×2): via INTRAVENOUS

## 2016-08-13 MED ORDER — ONDANSETRON HCL 4 MG PO TABS: 4.0000 mg | ORAL_TABLET | Freq: Four times a day (QID) | ORAL | Status: DC | PRN

## 2016-08-13 MED ORDER — ONDANSETRON HCL 4 MG/2ML IJ SOLN: 4.0000 mg | Freq: Four times a day (QID) | INTRAMUSCULAR | Status: DC | PRN

## 2016-08-13 MED ORDER — DOXYCYCLINE HYCLATE 100 MG PO TABS
100.0000 mg | ORAL_TABLET | Freq: Two times a day (BID) | ORAL | Status: DC
  Administered 2016-08-13 (×2): 100 mg via ORAL
  Filled 2016-08-13 (×2): qty 1

## 2016-08-13 MED ORDER — KETOROLAC TROMETHAMINE 30 MG/ML IJ SOLN
30.0000 mg | Freq: Once | INTRAMUSCULAR | Status: AC
  Administered 2016-08-13: 30 mg via INTRAVENOUS
  Filled 2016-08-13: qty 1

## 2016-08-13 MED ORDER — OXYCODONE-ACETAMINOPHEN 5-325 MG PO TABS
1.0000 | ORAL_TABLET | ORAL | Status: DC | PRN
  Administered 2016-08-13 (×2): 2 via ORAL
  Filled 2016-08-13 (×2): qty 2

## 2016-08-13 MED ORDER — BOOST / RESOURCE BREEZE PO LIQD
1.0000 | Freq: Three times a day (TID) | ORAL | Status: DC
Start: 2016-08-13 — End: 2016-08-13

## 2016-08-13 MED ORDER — SODIUM CHLORIDE 0.9 % IV SOLN: INTRAVENOUS | Status: DC

## 2016-08-13 NOTE — Discharge Summary (Signed)
Physician Discharge Summary  Wendy Mccann BMW:[id number] DOB: 1946-04-05 DOA: 08/12/2016  PCP: [facility]  Admit date: 08/12/2016 Discharge date: 08/13/2016  Admitted From: home Disposition:  home  Recommendations for Outpatient Follow-up:  1. Follow up with PCP in 1-2 weeks 2. Please obtain BMP/CBC in one week 3. Follow up with Dr. Romeo Apple tomorrow on 1/30 to evaluate right shoulder pain  Home Health Equipment/Devices:  Discharge Condition:stable CODE STATUS: full code Diet recommendation: Heart Healthy   Brief/Interim Summary: This is a 71 year old female who was recently seen in the emergency room 2 days ago for upper respiratory tract infection, given doxycycline and albuterol. She continued to have coughing, developed lower abdominal pain as well as right shoulder pain. She was not eating and drinking well. She felt very weak upon standing. She was noted to be orthostatic in the emergency room. She was admitted for further treatments.  Patient was monitored in the hospital and received aggressive IV hydration. Orthostasis has since resolved. The patient is feeling better today. Chest x-ray did not show evidence of pneumonia. Lungs are clear on exam. She is advised to complete her course of doxycycline and use albuterol as needed. Regarding her abdominal pain, CT scan of the abdomen and pelvis was unremarkable. Bilirubin was noted to be mildly elevated, but this corrected with hydration. Ultrasound of the abdomen showed gallbladder sludge, but no evidence of cholecystitis. She does not have any right upper quadrant tenderness. I suspect that her pain is related to soreness from prolonged coughing. She does have right shoulder pain, and feels that this may be related to her job. She reports lifting heavy materials several days ago and has had pain in her right shoulder. X-rays do not show any signs of fracture or dislocation. She does have fair range of motion at  this time. I was in place in a sling for comfort. She's been advised to take nonsteroidal anti-inflammatories. She is concerned C Dr. Romeo Apple tomorrow for further evaluation. Patient is otherwise stable for discharge home.  Discharge Diagnoses:  Principal Problem:   Orthostatic hypotension Active Problems:   Generalized weakness   Abdominal pain   Cough   Dehydration, mild    Discharge Instructions  Discharge Instructions    Diet - low sodium heart healthy    Complete by:  As directed    Increase activity slowly    Complete by:  As directed      Allergies as of 08/13/2016   No Known Allergies     Medication List    TAKE these medications   doxycycline 100 MG capsule Commonly known as:  VIBRAMYCIN Take 1 capsule (100 mg total) by mouth 2 (two) times daily.   HYDROcodone-homatropine 5-1.5 MG/5ML syrup Commonly known as:  HYCODAN Take 5 mLs by mouth every 6 (six) hours as needed.   ibuprofen 600 MG tablet Commonly known as:  ADVIL,MOTRIN Take 1 tablet (600 mg total) by mouth every 6 (six) hours as needed.   oxyCODONE-acetaminophen 5-325 MG tablet Commonly known as:  PERCOCET/ROXICET Take 1-2 tablets by mouth every 4 (four) hours as needed for severe pain.   traMADol 50 MG tablet Commonly known as:  ULTRAM Take 1 tablet (50 mg total) by mouth every 6 (six) hours as needed.      Follow-up Information    Fuller Canada, MD. Go to.   Specialties:  Orthopedic Surgery, Radiology Why:  Appointment on Friday February 2,2018 at 11:15am Contact information: [address] [place] [address]  [phone]        [facility] 1 week([facility]).   Specialty:  Family Medicine Contact information: [address] [place] [id number] [phone]          No Known Allergies  Consultations:     Procedures/Studies: Dg Chest 2 View  Result Date: 08/12/2016 CLINICAL DATA:  Abdominal pain and cough.  Shoulder  pain. EXAM: CHEST  2 VIEW COMPARISON:  None. FINDINGS: The heart size and mediastinal contours are within normal limits. Both lungs are clear. Mild to moderate thoracic spondylosis. No pleural effusion. IMPRESSION: 1.  No active cardiopulmonary disease is radiographically apparent. 2. Thoracic spondylosis. Electronically Signed   By: Gaylyn RongWalter  Liebkemann M.D.   On: 08/12/2016 17:19   Dg Chest 2 View  Result Date: 08/10/2016 CLINICAL DATA:  Cough, right shoulder and posterior chest pain for 4 days EXAM: CHEST  2 VIEW COMPARISON:  None. FINDINGS: Heart and mediastinal contours are within normal limits. No focal opacities or effusions. No acute bony abnormality. Mild peribronchial thickening IMPRESSION: Mild bronchitic changes. Electronically Signed   By: Charlett NoseKevin  Dover M.D.   On: 08/10/2016 09:35   Dg Shoulder Right  Result Date: 08/12/2016 CLINICAL DATA:  Patient with right shoulder pain, worse with movement. EXAM: RIGHT SHOULDER - 2+ VIEW COMPARISON:  Chest radiograph 08/10/2016. FINDINGS: There is no evidence of fracture or dislocation. There is no evidence of arthropathy or other focal bone abnormality. Soft tissues are unremarkable. IMPRESSION: No acute osseous abnormality. Electronically Signed   By: Annia Beltrew  Davis M.D.   On: 08/12/2016 17:18   [place] Abdomen Complete  Result Date: 08/13/2016 CLINICAL DATA:  Three weeks of mid abdominal pain. Two weeks of right shoulder pain. EXAM: ABDOMEN ULTRASOUND COMPLETE COMPARISON:  Abdominal and pelvic CT scan of August 12, 2016 FINDINGS: Gallbladder: No gallstones or wall thickening visualized. There is a small amount of sludge present. No sonographic Murphy sign noted by sonographer. Common bile duct: Diameter: 4.5 mm Liver: No focal lesion identified. Within normal limits in parenchymal echogenicity. IVC: No abnormality visualized. Pancreas: Visualized portion unremarkable. Spleen: Size and appearance within normal limits. Right Kidney: Length: 9.1 cm. Echogenicity  within normal limits. No mass or hydronephrosis visualized. Left Kidney: Length: 9.1 cm. Echogenicity within normal limits. No mass or hydronephrosis visualized. Abdominal aorta: There is no aneurysm.  Mural plaque is present. Other findings: There is no ascites. IMPRESSION: Gallbladder sludge without evidence of stones or acute cholecystitis. If chronic cholecystitis is suspected clinically, a nuclear medicine hepatobiliary scan with gallbladder ejection fraction determination may be useful. Electronically Signed   By: David  SwazilandJordan M.D.   On: 08/13/2016 08:41   Ct Abdomen Pelvis W Contrast  Result Date: 08/12/2016 CLINICAL DATA:  Lower abdominal pain, cough and generalized body aches. EXAM: CT ABDOMEN AND PELVIS WITH CONTRAST TECHNIQUE: Multidetector CT imaging of the abdomen and pelvis was performed using the standard protocol following bolus administration of intravenous contrast. CONTRAST:  100mL ISOVUE-300 IOPAMIDOL (ISOVUE-300) INJECTION 61% COMPARISON:  None. FINDINGS: Lower chest: Lung bases are within normal. Mild calcification over the left main and left anterior descending coronary artery is. Hepatobiliary: The liver, gallbladder and biliary tree are within normal. Pancreas: Within normal. Spleen: Within normal. Adrenals/Urinary Tract: Adrenal glands are normal. Kidneys are normal in size without hydronephrosis or nephrolithiasis. Ureters and bladder are within normal. Stomach/Bowel: Stomach and small bowel are within normal. Appendix is normal. Colon is unremarkable. Vascular/Lymphatic: Mild calcified plaque over the abdominal aorta. Remaining vascular  structures are unremarkable. No significant adenopathy. Reproductive: Uterus and adnexa are within normal. Other: No significant free fluid or focal inflammatory change. Musculoskeletal: Mild degenerate change of the spine and hips. Grade 1 anterolisthesis of L5 on S1. Disc disease at the L4-5 and L5-S1 levels. Bilateral L5 spondylolysis. IMPRESSION:  No acute findings in the abdomen/ pelvis. Minimal atherosclerotic coronary artery disease. Aortic atherosclerosis. Grade 1 anterolisthesis of L5 on S1 due to bilateral L5 spondylolysis. Disc disease at the L4-5 and L5-S1 levels. Electronically Signed   By: Elberta Fortis M.D.   On: 08/12/2016 18:02       Subjective: Continues to have pain in right shoulder with movement. Has some pain in lower abdomen that she feels is muscle soreness related to coughing  Discharge Exam: Vitals:   08/13/16 0710 08/13/16 1347  BP:  (!) 86/40  Pulse: 72 64  Resp: 18 20  Temp: 98.1 F (36.7 C) 97.7 F (36.5 C)   Vitals:   08/12/16 2330 08/13/16 0012 08/13/16 0710 08/13/16 1347  BP: 115/63 (!) 115/49  (!) 86/40  Pulse: 93 89 72 64  Resp:  18 18 20   Temp:  98.9 F (37.2 C) 98.1 F (36.7 C) 97.7 F (36.5 C)  TempSrc:  Oral Oral Oral  SpO2: 99% 99% 97% 96%  Weight:  64.1 kg (141 lb 4.8 oz)    Height:  5\' 1"  (1.549 m)      General: Pt is alert, awake, not in acute distress Cardiovascular: RRR, S1/S2 +, no rubs, no gallops Respiratory: CTA bilaterally, no wheezing, no rhonchi Abdominal: Soft, NT, ND, bowel sounds + Extremities: no edema, no cyanosis    The results of significant diagnostics from this hospitalization (including imaging, microbiology, ancillary and laboratory) are listed below for reference.     Microbiology: No results found for this or any previous visit (from the past 240 hour(s)).   Labs: BNP (last 3 results) No results for input(s): BNP in the last 8760 hours. Basic Metabolic Panel:  Recent Labs Lab 08/12/16 1601 08/13/16 0616  NA 133* 137  K 3.6 4.0  CL 97* 103  CO2 26 27  GLUCOSE 103* 104*  BUN 10 8  CREATININE 0.74 0.70  CALCIUM 9.8 8.6*   Liver Function Tests:  Recent Labs Lab 08/12/16 1601 08/13/16 1100  AST 19 15  ALT 13* 11*  ALKPHOS 68 52  BILITOT 1.8* 1.0  PROT 8.0 6.1*  ALBUMIN 3.8 2.8*    Recent Labs Lab 08/12/16 1601  LIPASE 20    No results for input(s): AMMONIA in the last 168 hours. CBC:  Recent Labs Lab 08/12/16 1601  WBC 10.8*  NEUTROABS 7.3  HGB 12.5  HCT 37.2  MCV 96.4  PLT 296   Cardiac Enzymes:  Recent Labs Lab 08/12/16 1601  TROPONINI <0.03   BNP: Invalid input(s): POCBNP CBG: No results for input(s): GLUCAP in the last 168 hours. D-Dimer No results for input(s): DDIMER in the last 72 hours. Hgb A1c No results for input(s): HGBA1C in the last 72 hours. Lipid Profile No results for input(s): CHOL, HDL, LDLCALC, TRIG, CHOLHDL, LDLDIRECT in the last 72 hours. Thyroid function studies No results for input(s): TSH, T4TOTAL, T3FREE, THYROIDAB in the last 72 hours.  Invalid input(s): FREET3 Anemia work up No results for input(s): VITAMINB12, FOLATE, FERRITIN, TIBC, IRON, RETICCTPCT in the last 72 hours. Urinalysis    Component Value Date/Time   COLORURINE YELLOW 08/12/2016 1728   APPEARANCEUR CLEAR 08/12/2016 1728   LABSPEC 1.012  08/12/2016 1728   PHURINE 6.0 08/12/2016 1728   GLUCOSEU NEGATIVE 08/12/2016 1728   HGBUR SMALL (A) 08/12/2016 1728   BILIRUBINUR NEGATIVE 08/12/2016 1728   KETONESUR 20 (A) 08/12/2016 1728   PROTEINUR NEGATIVE 08/12/2016 1728   NITRITE NEGATIVE 08/12/2016 1728   LEUKOCYTESUR NEGATIVE 08/12/2016 1728   Sepsis Labs Invalid input(s): PROCALCITONIN,  WBC,  LACTICIDVEN Microbiology No results found for this or any previous visit (from the past 240 hour(s)).   Time coordinating discharge: Over 30 minutes  SIGNED:   Erick Blinks, MD  [facility] 08/13/2016, 3:36 PM Pager   If 7PM-7AM, please contact night-coverage [email or web address] Password [email or web address]

## 2016-08-13 NOTE — H&P (Signed)
History and Physical    Wendy Mccann SXJ:[id number] DOB: [birth year] DOA: 08/12/2016  PCP: [facility]  Patient coming from: home  Chief Complaint:   weakness  HPI: Wendy Mccann is a 71 y.o. female with medical history significant of recent ER visit 2 days ago for uri, given doxycycline for which she has been taking.  She says she has been coughing so much and having pain in her right shoulder and abdomen for a day.  She has not been eating or drinking well.  Denies any n/v/d.  No dysuria or hemturia.  Pt reports she is having suprapubic discomfort for a day.  She has been taking the doxycycline.  No rashes.  Weaker when she stands up.  Pt found to be orthostatic in ED, referred for admission for dehydration.  Ct abd is neg.  Review of Systems: As per HPI otherwise 10 point review of systems negative.   History reviewed. No pertinent past medical history.  Past Surgical History:  Procedure Laterality Date  . ankles       reports that she has never smoked. She has never used smokeless tobacco. She reports that she does not drink alcohol or use drugs.  No Known Allergies  History reviewed. No pertinent family history. no premature CAD  Prior to Admission medications   Medication Sig Start Date End Date Taking? Authorizing Provider  doxycycline (VIBRAMYCIN) 100 MG capsule Take 1 capsule (100 mg total) by mouth 2 (two) times daily. 08/10/16  Yes Ivery Quale, PA-C  HYDROcodone-homatropine (HYCODAN) 5-1.5 MG/5ML syrup Take 5 mLs by mouth every 6 (six) hours as needed. 08/10/16  Yes Ivery Quale, PA-C  oxyCODONE-acetaminophen (PERCOCET/ROXICET) 5-325 MG per tablet Take 1-2 tablets by mouth every 4 (four) hours as needed for severe pain. 12/17/13  Yes Raeford Razor, MD  traMADol (ULTRAM) 50 MG tablet Take 1 tablet (50 mg total) by mouth every 6 (six) hours as needed. 02/01/14  Yes Eber Hong, MD    Physical Exam: Vitals:   08/12/16 2122 08/12/16 2130 08/12/16  2305 08/12/16 2330  BP:  113/69 104/58 115/63  Pulse:  94 97 93  Resp:      Temp:      TempSrc:      SpO2: 97% 98% 96% 99%  Weight:      Height:        Constitutional: NAD, calm, comfortable Vitals:   08/12/16 2122 08/12/16 2130 08/12/16 2305 08/12/16 2330  BP:  113/69 104/58 115/63  Pulse:  94 97 93  Resp:      Temp:      TempSrc:      SpO2: 97% 98% 96% 99%  Weight:      Height:       Eyes: PERRL, lids and conjunctivae normal ENMT: Mucous membranes are moist. Posterior pharynx clear of any exudate or lesions.Normal dentition.  Neck: normal, supple, no masses, no thyromegaly Respiratory: clear to auscultation bilaterally, no wheezing, no crackles. Normal respiratory effort. No accessory muscle use.  Cardiovascular: Regular rate and rhythm, no murmurs / rubs / gallops. No extremity edema. 2+ pedal pulses. No carotid bruits.  Abdomen: suprapubic tenderness, no masses palpated. No hepatosplenomegaly. Bowel sounds positive.  Musculoskeletal: no clubbing / cyanosis. No joint deformity upper and lower extremities. Good ROM, no contractures. Normal muscle tone.  Skin: no rashes, lesions, ulcers. No induration Neurologic: CN 2-12 grossly intact. Sensation intact, DTR normal. Strength 5/5 in all 4.  Psychiatric: Normal judgment and insight. Alert and oriented  x 3. Normal mood.    Labs on Admission: I have personally reviewed following labs and imaging studies  CBC:  Recent Labs Lab 08/12/16 1601  WBC 10.8*  NEUTROABS 7.3  HGB 12.5  HCT 37.2  MCV 96.4  PLT 296   Basic Metabolic Panel:  Recent Labs Lab 08/12/16 1601  NA 133*  K 3.6  CL 97*  CO2 26  GLUCOSE 103*  BUN 10  CREATININE 0.74  CALCIUM 9.8   GFR: Estimated Creatinine Clearance: 50.1 mL/min (by C-G formula based on SCr of 0.74 mg/dL). Liver Function Tests:  Recent Labs Lab 08/12/16 1601  AST 19  ALT 13*  ALKPHOS 68  BILITOT 1.8*  PROT 8.0  ALBUMIN 3.8    Recent Labs Lab 08/12/16 1601    LIPASE 20    Cardiac Enzymes:  Recent Labs Lab 08/12/16 1601  TROPONINI <0.03    Urine analysis:    Component Value Date/Time   COLORURINE YELLOW 08/12/2016 1728   APPEARANCEUR CLEAR 08/12/2016 1728   LABSPEC 1.012 08/12/2016 1728   PHURINE 6.0 08/12/2016 1728   GLUCOSEU NEGATIVE 08/12/2016 1728   HGBUR SMALL (A) 08/12/2016 1728   BILIRUBINUR NEGATIVE 08/12/2016 1728   KETONESUR 20 (A) 08/12/2016 1728   PROTEINUR NEGATIVE 08/12/2016 1728   NITRITE NEGATIVE 08/12/2016 1728   LEUKOCYTESUR NEGATIVE 08/12/2016 1728   Radiological Exams on Admission: Dg Chest 2 View  Result Date: 08/12/2016 CLINICAL DATA:  Abdominal pain and cough.  Shoulder pain. EXAM: CHEST  2 VIEW COMPARISON:  None. FINDINGS: The heart size and mediastinal contours are within normal limits. Both lungs are clear. Mild to moderate thoracic spondylosis. No pleural effusion. IMPRESSION: 1.  No active cardiopulmonary disease is radiographically apparent. 2. Thoracic spondylosis. Electronically Signed   By: Gaylyn RongWalter  Liebkemann M.D.   On: 08/12/2016 17:19   Dg Shoulder Right  Result Date: 08/12/2016 CLINICAL DATA:  Patient with right shoulder pain, worse with movement. EXAM: RIGHT SHOULDER - 2+ VIEW COMPARISON:  Chest radiograph 08/10/2016. FINDINGS: There is no evidence of fracture or dislocation. There is no evidence of arthropathy or other focal bone abnormality. Soft tissues are unremarkable. IMPRESSION: No acute osseous abnormality. Electronically Signed   By: Annia Beltrew  Davis M.D.   On: 08/12/2016 17:18   Ct Abdomen Pelvis W Contrast  Result Date: 08/12/2016 CLINICAL DATA:  Lower abdominal pain, cough and generalized body aches. EXAM: CT ABDOMEN AND PELVIS WITH CONTRAST TECHNIQUE: Multidetector CT imaging of the abdomen and pelvis was performed using the standard protocol following bolus administration of intravenous contrast. CONTRAST:  100mL ISOVUE-300 IOPAMIDOL (ISOVUE-300) INJECTION 61% COMPARISON:  None. FINDINGS:  Lower chest: Lung bases are within normal. Mild calcification over the left main and left anterior descending coronary artery is. Hepatobiliary: The liver, gallbladder and biliary tree are within normal. Pancreas: Within normal. Spleen: Within normal. Adrenals/Urinary Tract: Adrenal glands are normal. Kidneys are normal in size without hydronephrosis or nephrolithiasis. Ureters and bladder are within normal. Stomach/Bowel: Stomach and small bowel are within normal. Appendix is normal. Colon is unremarkable. Vascular/Lymphatic: Mild calcified plaque over the abdominal aorta. Remaining vascular structures are unremarkable. No significant adenopathy. Reproductive: Uterus and adnexa are within normal. Other: No significant free fluid or focal inflammatory change. Musculoskeletal: Mild degenerate change of the spine and hips. Grade 1 anterolisthesis of L5 on S1. Disc disease at the L4-5 and L5-S1 levels. Bilateral L5 spondylolysis. IMPRESSION: No acute findings in the abdomen/ pelvis. Minimal atherosclerotic coronary artery disease. Aortic atherosclerosis. Grade 1 anterolisthesis of L5 on  S1 due to bilateral L5 spondylolysis. Disc disease at the L4-5 and L5-S1 levels. Electronically Signed   By: Elberta Fortis M.D.   On: 08/12/2016 18:02    Assessment/Plan 71 yo female with recent URI on doxycycline comes in with dehydration, orthostatic hypotension and suprapubic discomfort  Principal Problem:   Orthostatic hypotension- give ivf bolus.  ivf overnight.  Repeat orthostatic vitals in am.     Active Problems:   Generalized weakness- due to dehydration   Abdominal pain- lfts neg, lip wnl, ct neg.  Check ultrasound.  ua is also normal   Cough- cont doxycyline   Dehydration, mild-  Noted, as above    DVT prophylaxis:  scds  Code Status:  full Family Communication: none  Disposition Plan:  Per day team Consults called:  none Admission status:  observation   Carlyon Nolasco A MD [facility]  If  7PM-7AM, please contact night-coverage [email or web address] Password [email or web address]  08/13/2016, 12:03 AM

## 2016-08-13 NOTE — Progress Notes (Addendum)
Nutrition Brief Note  Patient identified on the Malnutrition Screening Tool (MST) Report.   Patient has recent acute illness (URI) and she has not been eating as well as usual the past several days. She presents with increased weakness and dehydration (mild).   Wt Readings from Last 15 Encounters:  08/13/16 141 lb 4.8 oz (64.1 kg)  08/10/16 122 lb (55.3 kg)  02/01/14 147 lb (66.7 kg)   Body mass index is 26.7 kg/m. Patient meets criteria for overweight based on current BMI. Usual body weight in 64-67 kg range.  Current diet order is Heart Healthy, patient is consuming approximately 25% of meals at this time according to chart. Her lunch meal observed-100% of [place] and 75% of roasted potatoes consumed. She ate her eggs and grits for breakfast. Ms Clemenson says she is feeling more like eating today.   Nutrition-Focused physical exam: WDL   Labs and medications reviewed.   Recent Labs Lab 08/12/16 1601 08/13/16 0616  NA 133* 137  K 3.6 4.0  CL 97* 103  CO2 26 27  BUN 10 8  CREATININE 0.74 0.70  CALCIUM 9.8 8.6*  GLUCOSE 103* 104*     Royann Shivers MS,RD,CSG,LDN Office: [phone] Pager: [phone]

## 2016-08-14 ENCOUNTER — Other Ambulatory Visit: Payer: Self-pay | Admitting: *Deleted

## 2016-08-14 ENCOUNTER — Encounter: Payer: Self-pay | Admitting: Orthopedic Surgery

## 2016-08-14 ENCOUNTER — Ambulatory Visit (INDEPENDENT_AMBULATORY_CARE_PROVIDER_SITE_OTHER): Payer: 59 | Admitting: Orthopedic Surgery

## 2016-08-14 VITALS — BP 106/59 | HR 79 | Wt 144.0 lb

## 2016-08-14 DIAGNOSIS — M7551 Bursitis of right shoulder: Secondary | ICD-10-CM

## 2016-08-14 LAB — URINE CULTURE: Culture: NO GROWTH

## 2016-08-14 MED ORDER — PREDNISONE 10 MG PO TABS: 10.0000 mg | ORAL_TABLET | Freq: Three times a day (TID) | ORAL | 0 refills | Status: DC

## 2016-08-14 MED ORDER — PREDNISONE 10 MG (21) PO TBPK: 10.0000 mg | ORAL_TABLET | Freq: Three times a day (TID) | ORAL | 0 refills | Status: DC

## 2016-08-14 NOTE — Patient Instructions (Addendum)
  No work for 3 weeks   Therapy for 3 weeks   Start this medicine: Meds ordered this encounter  Medications  .               . predniSONE (DELTASONE) 10 MG tablet    Sig: Take 1 tablet (10 mg total) by mouth 3 (three) times daily.    Dispense:  60 tablet    Refill:  0                   You have received an injection of steroids into the joint. 15% of patients will have increased pain within the 24 hours postinjection.   This is transient and will go away.   We recommend that you use ice packs on the injection site for 20 minutes every 2 hours and extra strength Tylenol 2 tablets every 8 as needed until the pain resolves.  If you continue to have pain after taking the Tylenol and using the ice please call the office for further instructions.

## 2016-08-14 NOTE — Addendum Note (Signed)
Addended by: Adella Hare B on: 08/14/2016 02:55 PM   Modules accepted: Orders

## 2016-08-14 NOTE — Progress Notes (Signed)
Patient ID: Wendy Mccann, female   DOB: 03-05-1946, 71 y.o.   MRN: [id number]  Chief Complaint  Patient presents with  . Shoulder Pain    right shoulder pain    HPI Wendy Mccann is a 71 y.o. female.   Three-week history of right shoulder pain which began after the patient had a coughing episode from upper respiratory illness  Complains of severe pain over the right shoulder painful range of motion night pain pain with any use of the arm pain does not radiate pain is described as dull throbbing and aching   Review of Systems Review of Systems  Respiratory: Positive for cough.   Cardiovascular: Negative for chest pain.  Neurological: Negative for weakness and numbness.     Past Medical History:  Diagnosis Date  . Medical history non-contributory     Past Surgical History:  Procedure Laterality Date  . ankles      Physical Exam 1 Blood pressure (!) 106/59, pulse 79, weight 144 lb (65.3 kg). Physical Exam 2 The patient is well developed well nourished and well groomed. 3 Orientation to person place and time is normal  4 Mood is pleasant.  5 Ambulatory status NORMAL  6 Inspection of the  tenderness over the right shoulder joint and deltoid. Painful range of motion external rotation and flexion with external rotation 50 with flexion is limited to 90 normal sensation right hand normal radial pulses no lymphadenopathy in the right axilla or supraclavicular region  She has a positive impingement sign. No weakness in internal/external rotation of the rotator cuff muscles  Opposite extremity left there is no alignment abnormality, no contracture, no subluxation, no atrophy and neurovascular exam is intact  Data Reviewed X-rays shoulder normal  Assessment    Encounter Diagnosis  Name Primary?  . Acute shoulder bursitis, right Yes    Plan    Right shoulder subacromial injection  Therapy 3 weeks  No work 3 weeks  Prednisone 10 mg 3 times a day  Follow-up  3 weeks   Procedure note the subacromial injection shoulder RIGHT  Verbal consent was obtained to inject the  RIGHT   Shoulder  Timeout was completed to confirm the injection site is a subacromial space of the  RIGHT  shoulder   Medication used Depo-Medrol 40 mg and lidocaine 1% 3 cc  Anesthesia was provided by ethyl chloride  The injection was performed in the RIGHT  posterior subacromial space. After pinning the skin with alcohol and anesthetized the skin with ethyl chloride the subacromial space was injected using a 20-gauge needle. There were no complications  Sterile dressing was applied.

## 2016-08-15 NOTE — Progress Notes (Signed)
Discharge instructions, prescriptions and work note given, verbalized understanding, out in stable condition via w/c with staff.

## 2016-08-22 ENCOUNTER — Ambulatory Visit (HOSPITAL_COMMUNITY): Payer: 59

## 2016-08-22 ENCOUNTER — Other Ambulatory Visit: Payer: Self-pay | Admitting: *Deleted

## 2016-08-22 DIAGNOSIS — M25511 Pain in right shoulder: Secondary | ICD-10-CM | POA: Diagnosis not present

## 2016-08-22 DIAGNOSIS — M62411 Contracture of muscle, right shoulder: Secondary | ICD-10-CM | POA: Diagnosis not present

## 2016-08-22 DIAGNOSIS — M25611 Stiffness of right shoulder, not elsewhere classified: Secondary | ICD-10-CM | POA: Diagnosis not present

## 2016-08-22 DIAGNOSIS — M7551 Bursitis of right shoulder: Secondary | ICD-10-CM

## 2016-08-22 DIAGNOSIS — M62511 Muscle wasting and atrophy, not elsewhere classified, right shoulder: Secondary | ICD-10-CM | POA: Diagnosis not present

## 2016-08-23 ENCOUNTER — Ambulatory Visit (HOSPITAL_COMMUNITY): Payer: 59 | Attending: Orthopedic Surgery

## 2016-08-23 DIAGNOSIS — M62511 Muscle wasting and atrophy, not elsewhere classified, right shoulder: Secondary | ICD-10-CM | POA: Diagnosis not present

## 2016-08-23 DIAGNOSIS — M62411 Contracture of muscle, right shoulder: Secondary | ICD-10-CM | POA: Diagnosis not present

## 2016-08-23 DIAGNOSIS — M25611 Stiffness of right shoulder, not elsewhere classified: Secondary | ICD-10-CM | POA: Diagnosis not present

## 2016-08-23 DIAGNOSIS — M25511 Pain in right shoulder: Secondary | ICD-10-CM | POA: Diagnosis not present

## 2016-08-28 DIAGNOSIS — M62411 Contracture of muscle, right shoulder: Secondary | ICD-10-CM | POA: Diagnosis not present

## 2016-08-28 DIAGNOSIS — M25611 Stiffness of right shoulder, not elsewhere classified: Secondary | ICD-10-CM | POA: Diagnosis not present

## 2016-08-28 DIAGNOSIS — M25511 Pain in right shoulder: Secondary | ICD-10-CM | POA: Diagnosis not present

## 2016-08-28 DIAGNOSIS — M62511 Muscle wasting and atrophy, not elsewhere classified, right shoulder: Secondary | ICD-10-CM | POA: Diagnosis not present

## 2016-08-30 DIAGNOSIS — M25511 Pain in right shoulder: Secondary | ICD-10-CM | POA: Diagnosis not present

## 2016-08-30 DIAGNOSIS — M25611 Stiffness of right shoulder, not elsewhere classified: Secondary | ICD-10-CM | POA: Diagnosis not present

## 2016-08-30 DIAGNOSIS — M62411 Contracture of muscle, right shoulder: Secondary | ICD-10-CM | POA: Diagnosis not present

## 2016-08-30 DIAGNOSIS — M62511 Muscle wasting and atrophy, not elsewhere classified, right shoulder: Secondary | ICD-10-CM | POA: Diagnosis not present

## 2016-09-04 ENCOUNTER — Ambulatory Visit: Payer: 59 | Admitting: Orthopedic Surgery

## 2016-09-04 ENCOUNTER — Ambulatory Visit (INDEPENDENT_AMBULATORY_CARE_PROVIDER_SITE_OTHER): Payer: 59 | Admitting: Orthopedic Surgery

## 2016-09-04 ENCOUNTER — Encounter: Payer: Self-pay | Admitting: Orthopedic Surgery

## 2016-09-04 DIAGNOSIS — M62411 Contracture of muscle, right shoulder: Secondary | ICD-10-CM | POA: Diagnosis not present

## 2016-09-04 DIAGNOSIS — M7551 Bursitis of right shoulder: Secondary | ICD-10-CM | POA: Diagnosis not present

## 2016-09-04 DIAGNOSIS — M25511 Pain in right shoulder: Secondary | ICD-10-CM | POA: Diagnosis not present

## 2016-09-04 DIAGNOSIS — M25611 Stiffness of right shoulder, not elsewhere classified: Secondary | ICD-10-CM | POA: Diagnosis not present

## 2016-09-04 DIAGNOSIS — M62511 Muscle wasting and atrophy, not elsewhere classified, right shoulder: Secondary | ICD-10-CM | POA: Diagnosis not present

## 2016-09-04 NOTE — Progress Notes (Signed)
FOLLOW UP VISIT   Patient ID: Wendy Mccann, female   DOB: [birth year], 71 y.o.   MRN: [id number]  Chief Complaint  Patient presents with  . Follow-up    Rt shoudler s/p therapy and injection    HPI Wendy Mccann is a 71 y.o. female.   HPI  The patient was treated for bursitis with injection and physical therapy she is much improved. She has 3 more visits left  Review of Systems Review of Systems   There is no numbness or tingling in the right upper extremity  Physical Exam  Constitutional: She is oriented to person, place, and time. She appears well-developed and well-nourished.  Neurological: She is alert and oriented to person, place, and time.    She has regained elevation of the right and left shoulder equal. No tenderness or pain to palpation shoulder remained stable strength has improved skin is normal pulses good sensation is normal   MEDICAL DECISION MAKING  DATA   Therapy notes are included.  DIAGNOSIS  Bursitis right shoulder improved  PLAN(RISK)    Continue therapy return to work March 5 full duty

## 2016-09-06 DIAGNOSIS — M25511 Pain in right shoulder: Secondary | ICD-10-CM | POA: Diagnosis not present

## 2016-09-06 DIAGNOSIS — M62411 Contracture of muscle, right shoulder: Secondary | ICD-10-CM | POA: Diagnosis not present

## 2016-09-06 DIAGNOSIS — M62511 Muscle wasting and atrophy, not elsewhere classified, right shoulder: Secondary | ICD-10-CM | POA: Diagnosis not present

## 2016-09-06 DIAGNOSIS — M25611 Stiffness of right shoulder, not elsewhere classified: Secondary | ICD-10-CM | POA: Diagnosis not present

## 2016-09-11 ENCOUNTER — Ambulatory Visit: Payer: 59 | Admitting: Orthopedic Surgery

## 2016-12-25 ENCOUNTER — Emergency Department (HOSPITAL_COMMUNITY)
Admission: EM | Admit: 2016-12-25 | Discharge: 2016-12-25 | Disposition: A | Discharge status: Home / Self Care | Payer: 59 | Attending: Emergency Medicine | Admitting: Emergency Medicine

## 2016-12-25 ENCOUNTER — Emergency Department (HOSPITAL_COMMUNITY): Payer: 59

## 2016-12-25 ENCOUNTER — Encounter (HOSPITAL_COMMUNITY): Payer: Self-pay | Admitting: Emergency Medicine

## 2016-12-25 DIAGNOSIS — R1030 Lower abdominal pain, unspecified: Secondary | ICD-10-CM | POA: Diagnosis present

## 2016-12-25 DIAGNOSIS — K5900 Constipation, unspecified: Secondary | ICD-10-CM | POA: Insufficient documentation

## 2016-12-25 DIAGNOSIS — Z79899 Other long term (current) drug therapy: Secondary | ICD-10-CM | POA: Insufficient documentation

## 2016-12-25 DIAGNOSIS — R350 Frequency of micturition: Secondary | ICD-10-CM | POA: Diagnosis not present

## 2016-12-25 DIAGNOSIS — R109 Unspecified abdominal pain: Secondary | ICD-10-CM | POA: Diagnosis not present

## 2016-12-25 HISTORY — DX: Sprain of unspecified rotator cuff capsule, initial encounter: S43.429A

## 2016-12-25 LAB — URINALYSIS, ROUTINE W REFLEX MICROSCOPIC
Bacteria, UA: NONE SEEN
Bilirubin Urine: NEGATIVE
Glucose, UA: NEGATIVE mg/dL
Ketones, ur: NEGATIVE mg/dL
Leukocytes, UA: NEGATIVE
Nitrite: NEGATIVE
Protein, ur: NEGATIVE mg/dL
Specific Gravity, Urine: 1.024 (ref 1.005–1.030)
pH: 5 (ref 5.0–8.0)

## 2016-12-25 LAB — BASIC METABOLIC PANEL
Anion gap: 9 (ref 5–15)
BUN: 12 mg/dL (ref 6–20)
CO2: 29 mmol/L (ref 22–32)
Calcium: 9.3 mg/dL (ref 8.9–10.3)
Chloride: 100 mmol/L — ABNORMAL LOW (ref 101–111)
Creatinine, Ser: 0.93 mg/dL (ref 0.44–1.00)
GFR calc Af Amer: >60 mL/min (ref >60)
GFR calc non Af Amer: >60 mL/min (ref >60)
Glucose, Bld: 80 mg/dL (ref 65–99)
Potassium: 3.6 mmol/L (ref 3.5–5.1)
Sodium: 138 mmol/L (ref 135–145)

## 2016-12-25 LAB — CBC WITH DIFFERENTIAL/PLATELET
Basophils Absolute: 0 10*3/uL (ref 0.0–0.1)
Basophils Relative: 0 %
Eosinophils Absolute: 0.3 10*3/uL (ref 0.0–0.7)
Eosinophils Relative: 4 %
HCT: 36.6 % (ref 36.0–46.0)
Hemoglobin: 12.1 g/dL (ref 12.0–15.0)
Lymphocytes Relative: 31 %
Lymphs Abs: 2.5 10*3/uL (ref 0.7–4.0)
MCH: 32.1 pg (ref 26.0–34.0)
MCHC: 33.1 g/dL (ref 30.0–36.0)
MCV: 97.1 fL (ref 78.0–100.0)
Monocytes Absolute: 1.2 10*3/uL — ABNORMAL HIGH (ref 0.1–1.0)
Monocytes Relative: 15 %
Neutro Abs: 4 10*3/uL (ref 1.7–7.7)
Neutrophils Relative %: 50 %
Platelets: 270 10*3/uL (ref 150–400)
RBC: 3.77 MIL/uL — ABNORMAL LOW (ref 3.87–5.11)
RDW: 13 % (ref 11.5–15.5)
WBC: 8 10*3/uL (ref 4.0–10.5)

## 2016-12-25 MED ORDER — MAGNESIUM CITRATE PO SOLN: ORAL | 0 refills | Status: DC

## 2016-12-25 MED ORDER — IOPAMIDOL (ISOVUE-300) INJECTION 61%
100.0000 mL | Freq: Once | INTRAVENOUS | Status: AC | PRN
  Administered 2016-12-25: 100 mL via INTRAVENOUS

## 2016-12-25 NOTE — Discharge Instructions (Signed)
Follow-up with your doctor in 2-3 days if not improving

## 2016-12-25 NOTE — ED Provider Notes (Signed)
[facility]-EMERGENCY DEPT Provider Note   CSN: [id number] Arrival date & time: 12/25/16  1438     History   Chief Complaint Chief Complaint  Patient presents with  . Abdominal Pain  . Urinary Frequency    HPI Wendy Mccann is a 71 y.o. female.  Patient complains of lower abdominal discomfort and some burning in her abdomen   The history is provided by the patient.  Abdominal Pain   This is a new problem. The current episode started 12 to 24 hours ago. The problem occurs hourly. The problem has not changed since onset.The pain is associated with an unknown factor. The pain is located in the suprapubic region. The quality of the pain is aching. The pain is at a severity of 4/10. Associated symptoms include frequency. Pertinent negatives include diarrhea, hematuria and headaches.  Urinary Frequency  Associated symptoms include abdominal pain. Pertinent negatives include no chest pain and no headaches.    Past Medical History:  Diagnosis Date  . Medical history non-contributory   . Rotator cuff (capsule) sprain     Patient Active Problem List   Diagnosis Date Noted  . Generalized weakness 08/12/2016  . Orthostatic hypotension 08/12/2016  . Abdominal pain 08/12/2016  . Cough 08/12/2016  . Dehydration, mild 08/12/2016    Past Surgical History:  Procedure Laterality Date  . ankles      OB History    Gravida Para Term Preterm AB Living   0 0 0 0 0 0   SAB TAB Ectopic Multiple Live Births   0 0 0 0 0       Home Medications    Prior to Admission medications   Medication Sig Start Date End Date Taking? Authorizing Provider  Phenyleph-CPM-DM-APAP (ALKA-SELTZER PLUS COLD & COUGH) 11-14-08-325 MG CAPS Take 1 capsule by mouth once as needed.   Yes [provider]  magnesium citrate SOLN Drink one half of a bottle.  If you do not have a bowel movement in 4-6 hours then drink the other half of the bottle. 12/25/16   Bethann BerkshireZammit, Sarit Sparano, MD    Family History No family  history on file.  Social History Social History  Substance Use Topics  . Smoking status: Never Smoker  . Smokeless tobacco: Never Used  . Alcohol use No     Allergies   Patient has no known allergies.   Review of Systems Review of Systems  Constitutional: Negative for appetite change and fatigue.  HENT: Negative for congestion, ear discharge and sinus pressure.   Eyes: Negative for discharge.  Respiratory: Negative for cough.   Cardiovascular: Negative for chest pain.  Gastrointestinal: Positive for abdominal pain. Negative for diarrhea.  Genitourinary: Positive for frequency. Negative for hematuria.  Musculoskeletal: Negative for back pain.  Skin: Negative for rash.  Neurological: Negative for seizures and headaches.  Psychiatric/Behavioral: Negative for hallucinations.     Physical Exam Updated Vital Signs BP 100/77   Pulse 69   Temp 97.4 F (36.3 C) (Oral)   Resp 16   Ht 5\' 1"  (1.549 m)   Wt 63.5 kg (140 lb)   SpO2 100%   BMI 26.45 kg/m   Physical Exam  Constitutional: She is oriented to person, place, and time. She appears well-developed.  HENT:  Head: Normocephalic.  Eyes: Conjunctivae and EOM are normal. No scleral icterus.  Neck: Neck supple. No thyromegaly present.  Cardiovascular: Normal rate and regular rhythm.  Exam reveals no gallop and no friction rub.   No murmur heard.  Pulmonary/Chest: No stridor. She has no wheezes. She has no rales. She exhibits no tenderness.  Abdominal: She exhibits no distension. There is tenderness. There is no rebound.  Mild suprapubic tender  Musculoskeletal: Normal range of motion. She exhibits no edema.  Lymphadenopathy:    She has no cervical adenopathy.  Neurological: She is oriented to person, place, and time. She exhibits normal muscle tone. Coordination normal.  Skin: No rash noted. No erythema.  Psychiatric: She has a normal mood and affect. Her behavior is normal.     ED Treatments / Results  Labs (all  labs ordered are listed, but only abnormal results are displayed) Labs Reviewed  URINALYSIS, ROUTINE W REFLEX MICROSCOPIC - Abnormal; Notable for the following:       Result Value   APPearance HAZY (*)    Hgb urine dipstick SMALL (*)    Squamous Epithelial / LPF 0-5 (*)    All other components within normal limits  CBC WITH DIFFERENTIAL/PLATELET - Abnormal; Notable for the following:    RBC 3.77 (*)    Monocytes Absolute 1.2 (*)    All other components within normal limits  BASIC METABOLIC PANEL - Abnormal; Notable for the following:    Chloride 100 (*)    All other components within normal limits  URINE CULTURE    EKG  EKG Interpretation None       Radiology Ct Abdomen Pelvis W Contrast  Result Date: 12/25/2016 CLINICAL DATA:  Lower abdominal pain for several days but worsening today. Increased urinary frequency. EXAM: CT ABDOMEN AND PELVIS WITH CONTRAST TECHNIQUE: Multidetector CT imaging of the abdomen and pelvis was performed using the standard protocol following bolus administration of intravenous contrast. CONTRAST:  ISOVUE-300 IOPAMIDOL (ISOVUE-300) INJECTION 61% COMPARISON:  Multiple exams, including 08/12/2016 and ultrasound of 08/13/2016. FINDINGS: Despite efforts by the technologist and patient, motion artifact is present on today's exam and could not be eliminated. This reduces exam sensitivity and specificity. Lower chest: Unremarkable Hepatobiliary: Unremarkable Pancreas: Unremarkable Spleen: Unremarkable Adrenals/Urinary Tract: Unremarkable Stomach/Bowel: Sigmoid colon diverticulosis. Prominent stool throughout the colon favors constipation. Appendix normal. Vascular/Lymphatic: Porta hepatis/peripancreatic lymph node 1 cm in short axis on image 17/2. Portacaval lymph node 0.9 cm in short axis on image 19/2. Small gastrohepatic ligament lymph nodes are present. Aortoiliac atherosclerotic vascular disease. Reproductive: Mildly retroverted uterus.  Adnexa unremarkable.  Other: No supplemental non-categorized findings. Musculoskeletal: 10 mm anterolisthesis of L5 on S1 associated with chronic bilateral pars defects at L5, the resulting an considerable bilateral foraminal stenosis at the L5-S1 level. Degenerative disc disease at L3-4 and L4-5. Bilateral osteitis pubis. Mild to moderate degenerative arthropathy of both hips. IMPRESSION: 1.  Prominent stool throughout the colon favors constipation. 2. Sigmoid diverticulosis without active diverticulitis. Normal appendix. 3. 10 mm anterolisthesis of L5-S1 associated with considerable bilateral foraminal stenosis and chronic bilateral pars defects at L5. 4. Osteitis pubis. 5.  Aortic Atherosclerosis (ICD10-I70.0). 6. Several borderline prominent lymph nodes along the porta hepatis. Significance uncertain but these may be reactive. Electronically Signed   By: Gaylyn Rong M.D.   On: 12/25/2016 18:08    Procedures Procedures (including critical care time)  Medications Ordered in ED Medications  iopamidol (ISOVUE-300) 61 % injection 100 mL (100 mLs Intravenous Contrast Given 12/25/16 1735)     Initial Impression / Assessment and Plan / ED Course  I have reviewed the triage vital signs and the nursing notes.  Pertinent labs & imaging results that were available during my care of the patient were  reviewed by me and considered in my medical decision making (see chart for details).     Labs unremarkable. CT scan shows constipation. Patient will be given magnesium citrate and she'll follow-up with her PCP  Final Clinical Impressions(s) / ED Diagnoses   Final diagnoses:  Constipation, unspecified constipation type    New Prescriptions New Prescriptions   MAGNESIUM CITRATE SOLN    Drink one half of a bottle.  If you do not have a bowel movement in 4-6 hours then drink the other half of the bottle.     Bethann Berkshire, MD 12/25/16 1950

## 2016-12-25 NOTE — ED Triage Notes (Signed)
Pt c/o lower abd pain with urianry freq today. Denies n/v/d. nad

## 2016-12-27 LAB — URINE CULTURE: Culture: NO GROWTH

## 2018-01-23 IMAGING — US US ABDOMEN COMPLETE
1 series · 14 of 25 positions shown · non-contrast
Comparison: Abdominal and pelvic CT scan August 12, 2016

CLINICAL DATA: Three weeks of mid abdominal pain. Two weeks of
right shoulder pain.

EXAM:
ABDOMEN ULTRASOUND COMPLETE

[Series 1: us abdomen complete · 0.16mm/px · 14 of 102 slices shown]
[im 1/102]
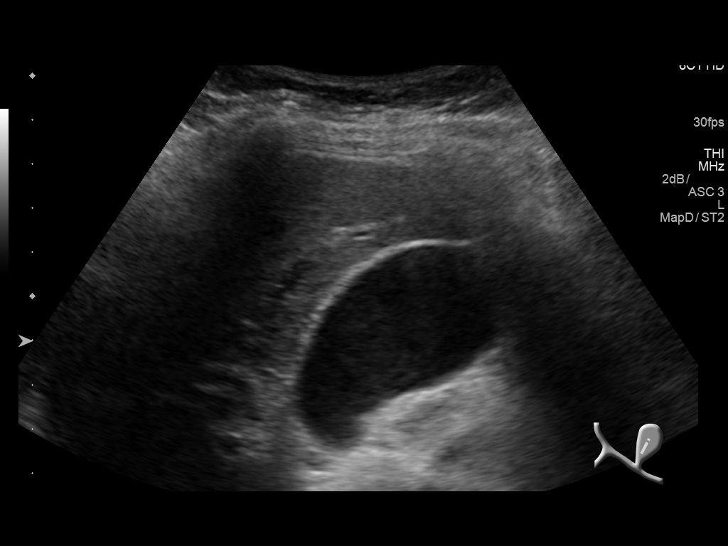
[im 9/102]
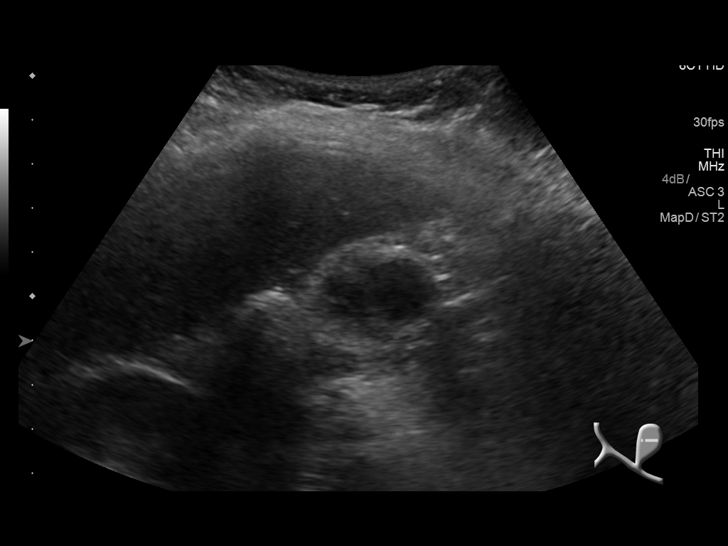
[im 17/102]
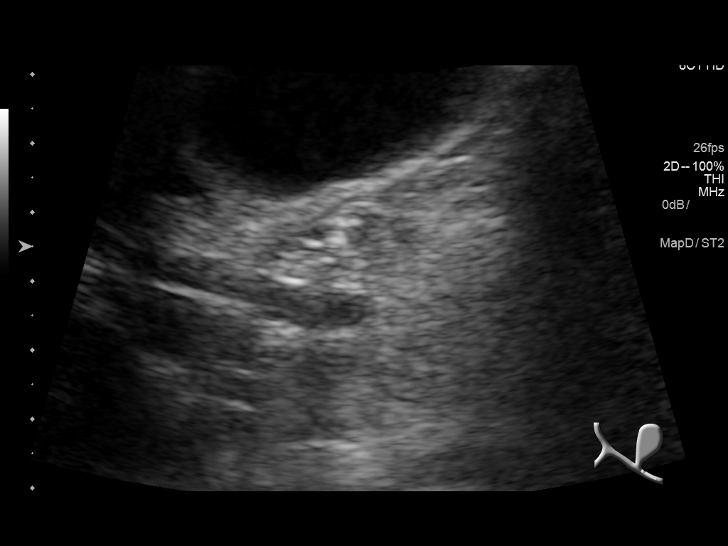
[im 26/102]
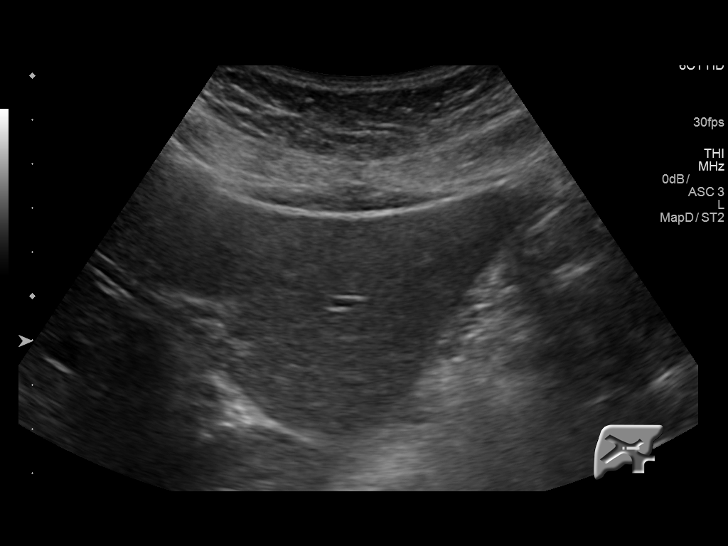
[im 34/102]
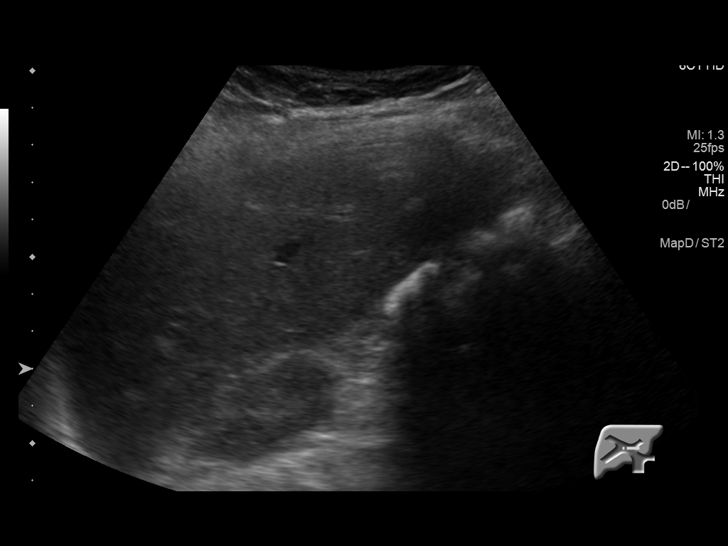
[im 38/102]
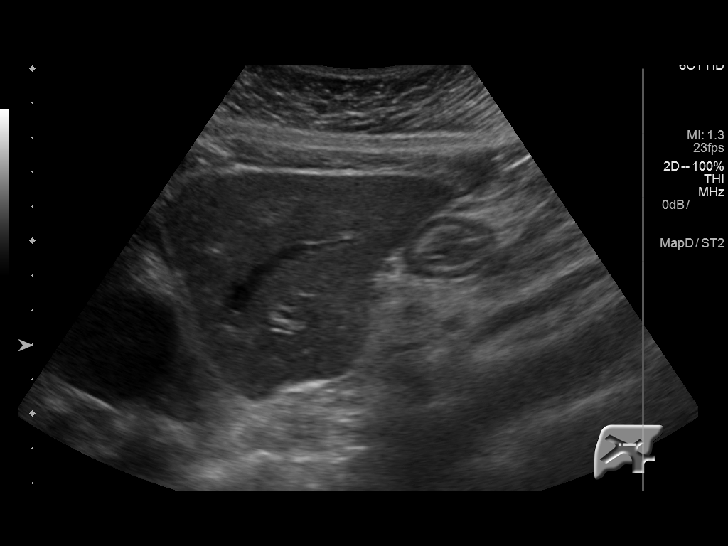
[im 47/102]
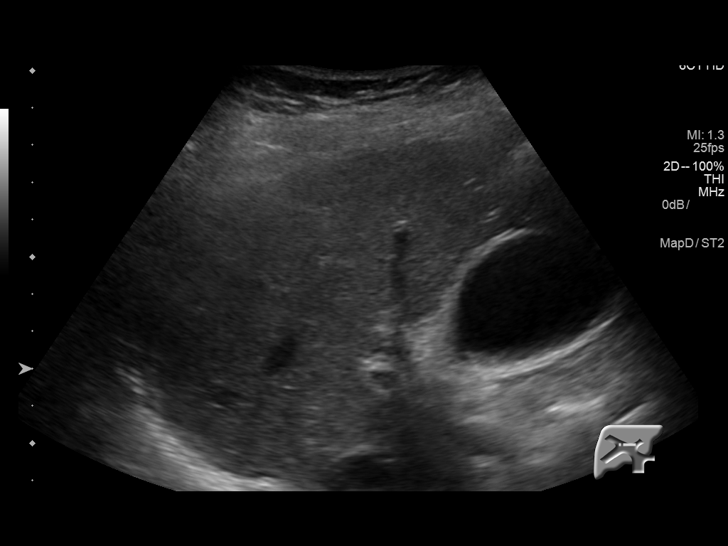
[im 55/102]
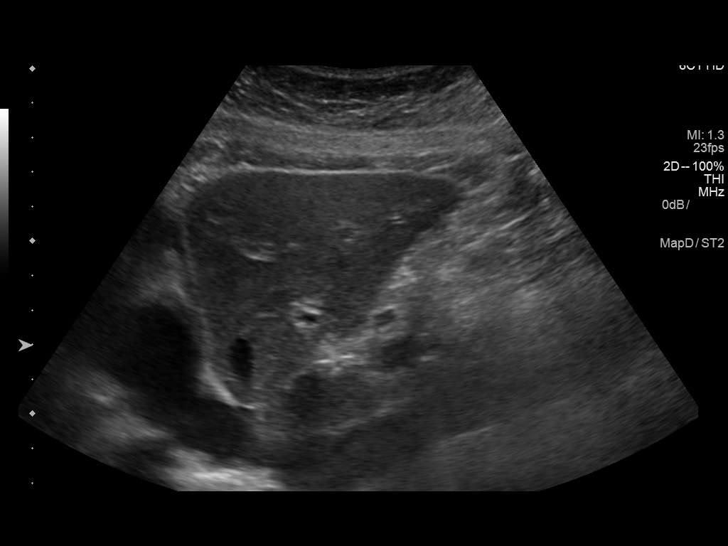
[im 64/102]
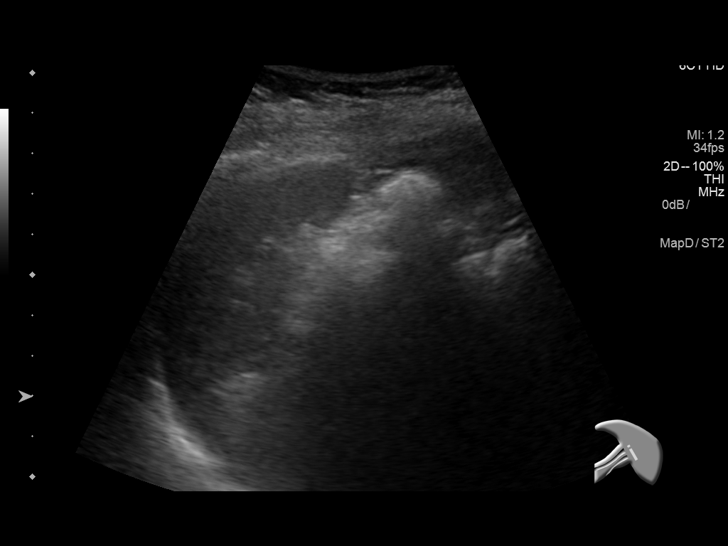
[im 68/102]
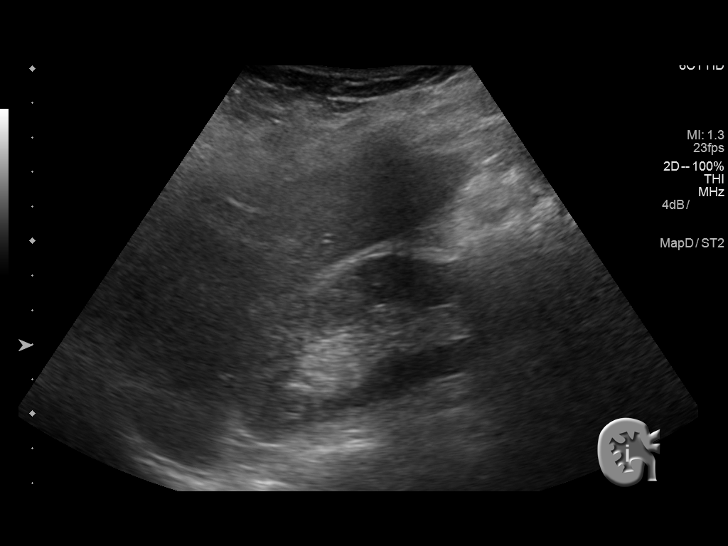
[im 76/102]
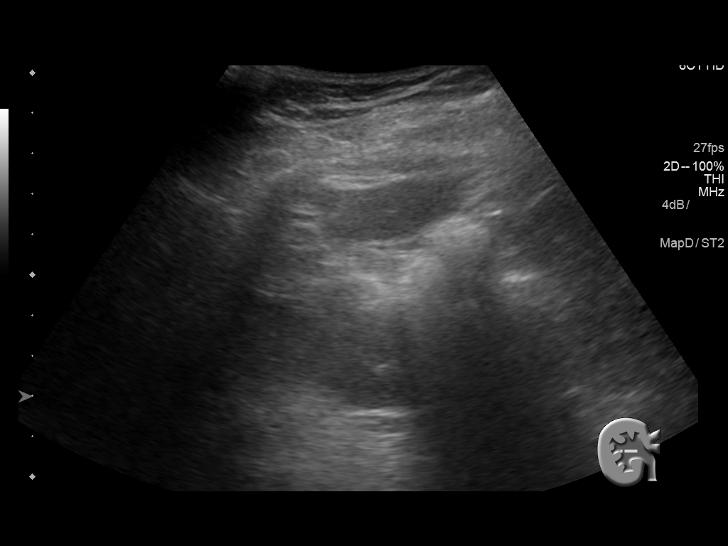
[im 85/102]
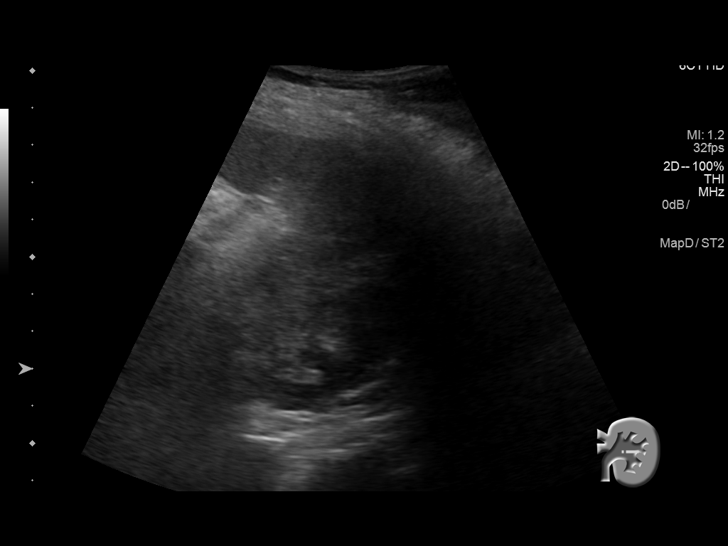
[im 93/102]
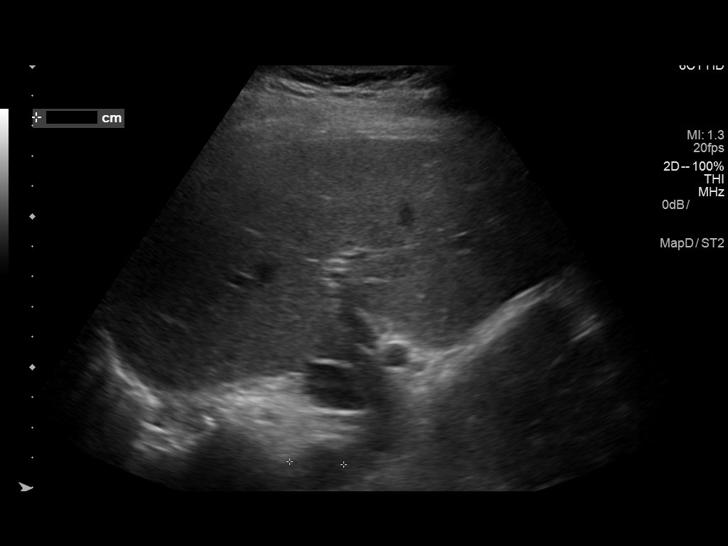
[im 102/102]
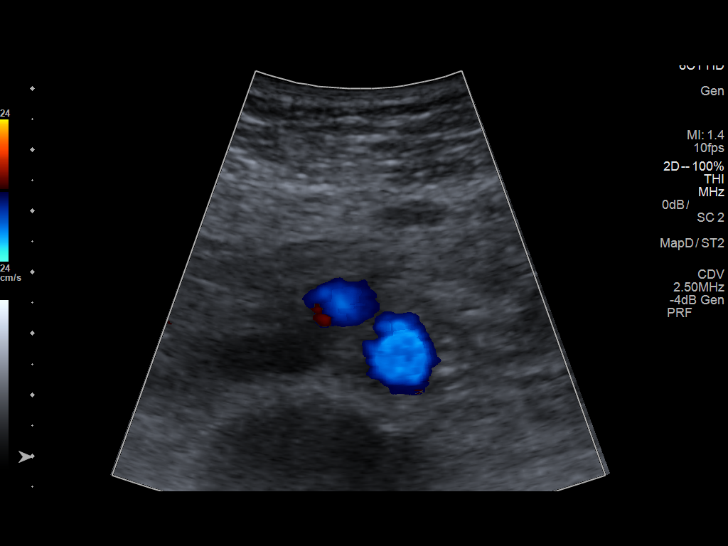

[14 of 25 positions shown; findings below may reference images not displayed]

FINDINGS: Gallbladder: No gallstones or wall thickening visualized. There is a
small amount of sludge present. No sonographic Murphy sign noted by
sonographer.

Common bile duct: Diameter: 4.5 mm

Liver: No focal lesion identified. Within normal limits in
parenchymal echogenicity.

IVC: No abnormality visualized.

Pancreas: Visualized portion unremarkable.

Spleen: Size and appearance within normal limits.

Right Kidney: Length: 9.1 cm. Echogenicity within normal limits. No
mass or hydronephrosis visualized.

Left Kidney: Length: 9.1 cm. Echogenicity within normal limits. No
mass or hydronephrosis visualized.

Abdominal aorta: There is no aneurysm.  Mural plaque is present.

Other findings: There is no ascites.
IMPRESSION: Gallbladder sludge without evidence of stones or acute
cholecystitis. If chronic cholecystitis is suspected clinically, a
nuclear medicine hepatobiliary scan with gallbladder ejection
fraction determination may be useful.

## 2018-05-19 ENCOUNTER — Telehealth: Payer: Self-pay | Admitting: Orthopedic Surgery

## 2018-05-19 ENCOUNTER — Encounter (HOSPITAL_COMMUNITY): Payer: Self-pay | Admitting: Emergency Medicine

## 2018-05-19 ENCOUNTER — Emergency Department (HOSPITAL_COMMUNITY)
Admission: EM | Admit: 2018-05-19 | Discharge: 2018-05-19 | Disposition: A | Discharge status: Home / Self Care | Payer: PRIVATE HEALTH INSURANCE | Attending: Emergency Medicine | Admitting: Emergency Medicine

## 2018-05-19 ENCOUNTER — Emergency Department (HOSPITAL_COMMUNITY): Payer: PRIVATE HEALTH INSURANCE

## 2018-05-19 ENCOUNTER — Other Ambulatory Visit: Payer: Self-pay

## 2018-05-19 DIAGNOSIS — Z79899 Other long term (current) drug therapy: Secondary | ICD-10-CM | POA: Diagnosis not present

## 2018-05-19 DIAGNOSIS — M25511 Pain in right shoulder: Secondary | ICD-10-CM | POA: Diagnosis not present

## 2018-05-19 MED ORDER — PREDNISONE 20 MG PO TABS: 40.0000 mg | ORAL_TABLET | Freq: Every day | ORAL | 0 refills | Status: AC

## 2018-05-19 MED ORDER — ACETAMINOPHEN 500 MG PO TABS: 500.0000 mg | ORAL_TABLET | Freq: Four times a day (QID) | ORAL | 0 refills | Status: DC | PRN

## 2018-05-19 NOTE — ED Provider Notes (Signed)
[facility] EMERGENCY DEPARTMENT Provider Note   CSN: [id number] Arrival date & time: 05/19/18  8099     History   Chief Complaint Chief Complaint  Patient presents with  . Shoulder Pain    HPI Wendy Mccann is a 72 y.o. female who presents with right shoulder pain that began 3 days ago.  She reports her pain is worse with movement.  It is worse with elevating her arm above her head.  She denies any numbness or tingling, chest pain, shortness of breath.  She has no pain in her left shoulder.  She denies any fevers.  She has not tried anything at home for her symptoms.  Per chart review, patient saw Dr. Romeo Apple in January 2018 and received a steroid injection in her right shoulder.  Patient does not remember this.  Per follow-up note, patient had good relief with this.  HPI  Past Medical History:  Diagnosis Date  . Medical history non-contributory   . Rotator cuff (capsule) sprain     Patient Active Problem List   Diagnosis Date Noted  . Generalized weakness 08/12/2016  . Orthostatic hypotension 08/12/2016  . Abdominal pain 08/12/2016  . Cough 08/12/2016  . Dehydration, mild 08/12/2016    Past Surgical History:  Procedure Laterality Date  . ankles       OB History    Gravida  0   Para  0   Term  0   Preterm  0   AB  0   Living  0     SAB  0   TAB  0   Ectopic  0   Multiple  0   Live Births  0            Home Medications    Prior to Admission medications   Medication Sig Start Date End Date Taking? Authorizing Provider  acetaminophen (TYLENOL) 500 MG tablet Take 1 tablet (500 mg total) by mouth every 6 (six) hours as needed. 05/19/18   Byanka Landrus, Waylan Boga, PA-C  magnesium citrate SOLN Drink one half of a bottle.  If you do not have a bowel movement in 4-6 hours then drink the other half of the bottle. 12/25/16   Bethann Berkshire, MD  Phenyleph-CPM-DM-APAP (ALKA-SELTZER PLUS COLD & COUGH) 11-14-08-325 MG CAPS Take 1 capsule by mouth once as needed.     [provider]  predniSONE (DELTASONE) 20 MG tablet Take 2 tablets (40 mg total) by mouth daily for 5 days. 05/19/18 05/24/18  Emi Holes, PA-C    Family History History reviewed. No pertinent family history.  Social History Social History   Tobacco Use  . Smoking status: Never Smoker  . Smokeless tobacco: Never Used  Substance Use Topics  . Alcohol use: No  . Drug use: No     Allergies   Patient has no known allergies.   Review of Systems Review of Systems  Constitutional: Negative for fever.  Respiratory: Negative for shortness of breath.   Cardiovascular: Negative for chest pain.  Musculoskeletal: Positive for arthralgias.  Neurological: Negative for numbness.     Physical Exam Updated Vital Signs BP 115/61 (BP Location: Right Arm)   Pulse 64   Temp 97.8 F (36.6 C) (Oral)   Resp 18   Ht 5\' 1"  (1.549 m)   Wt 54.4 kg   SpO2 99%   BMI 22.67 kg/m   Physical Exam  Constitutional: She appears well-developed and well-nourished. No distress.  HENT:  Head: Normocephalic and  atraumatic.  Mouth/Throat: Oropharynx is clear and moist. No oropharyngeal exudate.  Eyes: Pupils are equal, round, and reactive to light. Conjunctivae are normal. Right eye exhibits no discharge. Left eye exhibits no discharge. No scleral icterus.  Neck: Normal range of motion. Neck supple. No thyromegaly present.  Cardiovascular: Normal rate, regular rhythm, normal heart sounds and intact distal pulses. Exam reveals no gallop and no friction rub.  No murmur heard. Pulmonary/Chest: Effort normal and breath sounds normal. No stridor. No respiratory distress. She has no wheezes. She has no rales.  Abdominal: Soft. Bowel sounds are normal. She exhibits no distension. There is no tenderness. There is no rebound and no guarding.  Musculoskeletal: She exhibits no edema.       Right shoulder: She exhibits decreased range of motion, tenderness and bony tenderness. She exhibits normal  pulse.       Arms: Anterior tenderness with pain with resisted flexion and internal rotation, positive empty can test; 5/5 strength, equal bilateral grip strength, radial pulses intact, sensation intact  Lymphadenopathy:    She has no cervical adenopathy.  Neurological: She is alert. Coordination normal.  Skin: Skin is warm and dry. No rash noted. She is not diaphoretic. No pallor.  Psychiatric: She has a normal mood and affect.  Nursing note and vitals reviewed.    ED Treatments / Results  Labs (all labs ordered are listed, but only abnormal results are displayed) Labs Reviewed - No data to display  EKG None  Radiology Dg Shoulder Right  Result Date: 05/19/2018 CLINICAL DATA:  Right shoulder pain 3 days after moderate activity and lifting. Not over anterior right shoulder. EXAM: RIGHT SHOULDER - 2+ VIEW COMPARISON:  08/12/2016 FINDINGS: Mild degenerative changes of the [facility] joint and glenohumeral joints. No evidence of fracture or dislocation. IMPRESSION: No acute findings. Mild degenerative changes as described. Electronically Signed   By: Elberta Fortis M.D.   On: 05/19/2018 10:49    Procedures Procedures (including critical care time)  Medications Ordered in ED Medications - No data to display   Initial Impression / Assessment and Plan / ED Course  I have reviewed the triage vital signs and the nursing notes.  Pertinent labs & imaging results that were available during my care of the patient were reviewed by me and considered in my medical decision making (see chart for details).     Patient with recurrence of right shoulder pain.  Per chart review, patient has history of right shoulder bursitis.  She does not remember having pain in her right shoulder and believes it was her left shoulder, however there are several notes pertaining to the right shoulder.  Either way, x-ray today is negative for acute changes but does show mild degenerative changes.  There are no signs of  septic joint.  Suspect bursitis versus other soft tissue injury.  Patient had good relief with steroid injection and prednisone at orthopedics last visit, so will start prednisone at this time.  Patient advised to take Tylenol for pain control.  Ice and heat recommended.  Range of motion exercises recommended.  Return precautions discussed.  Follow-up to orthopedics for further management.  Patient understands and agrees with plan.  Patient vitals stable throughout ED course and discharged in satisfactory condition.  Final Clinical Impressions(s) / ED Diagnoses   Final diagnoses:  Acute pain of right shoulder    ED Discharge Orders         Ordered    predniSONE (DELTASONE) 20 MG tablet  Daily  05/19/18 1116    acetaminophen (TYLENOL) 500 MG tablet  Every 6 hours PRN     05/19/18 [id number]           Ghali Morissette, AsburyAlexandra M, [place] JerseyPA-C 05/19/18 1445    Linwood DibblesKnapp, Jon, MD 05/19/18 1535

## 2018-05-19 NOTE — Telephone Encounter (Signed)
Patient came by our office inquiring about scheduling appointment for right shoulder problem - relays injured at work today, 05/19/18; employer is [employer]; patient went to [facility] Emergency room for treatment. States reported to her supervisors, and was given contact information for [employer]. Appointment pending approval from Workers comp; patient voiced understanding.

## 2018-05-19 NOTE — Discharge Instructions (Signed)
Take prednisone until completed.  Take Tylenol every 6 hours as needed for pain.  Use ice and heat alternating 20 minutes on, 20 minutes off.  Please follow-up with Dr. Romeo Apple for further evaluation and treatment of your shoulder pain.  Please return the emergency department if you develop any new or worsening symptoms.

## 2018-05-19 NOTE — ED Triage Notes (Signed)
Denies injury. Pain since Friday.

## 2018-06-04 DIAGNOSIS — H5203 Hypermetropia, bilateral: Secondary | ICD-10-CM | POA: Diagnosis not present

## 2018-06-04 DIAGNOSIS — H40033 Anatomical narrow angle, bilateral: Secondary | ICD-10-CM | POA: Diagnosis not present

## 2018-06-04 DIAGNOSIS — H40013 Open angle with borderline findings, low risk, bilateral: Secondary | ICD-10-CM | POA: Diagnosis not present

## 2018-08-07 IMAGING — DX DG CHEST 2V
2 series · 2 of 2 positions shown · non-contrast
Comparison: None.

CLINICAL DATA: Abdominal pain and cough.  Shoulder pain.

EXAM:
CHEST  2 VIEW

[chest lat]
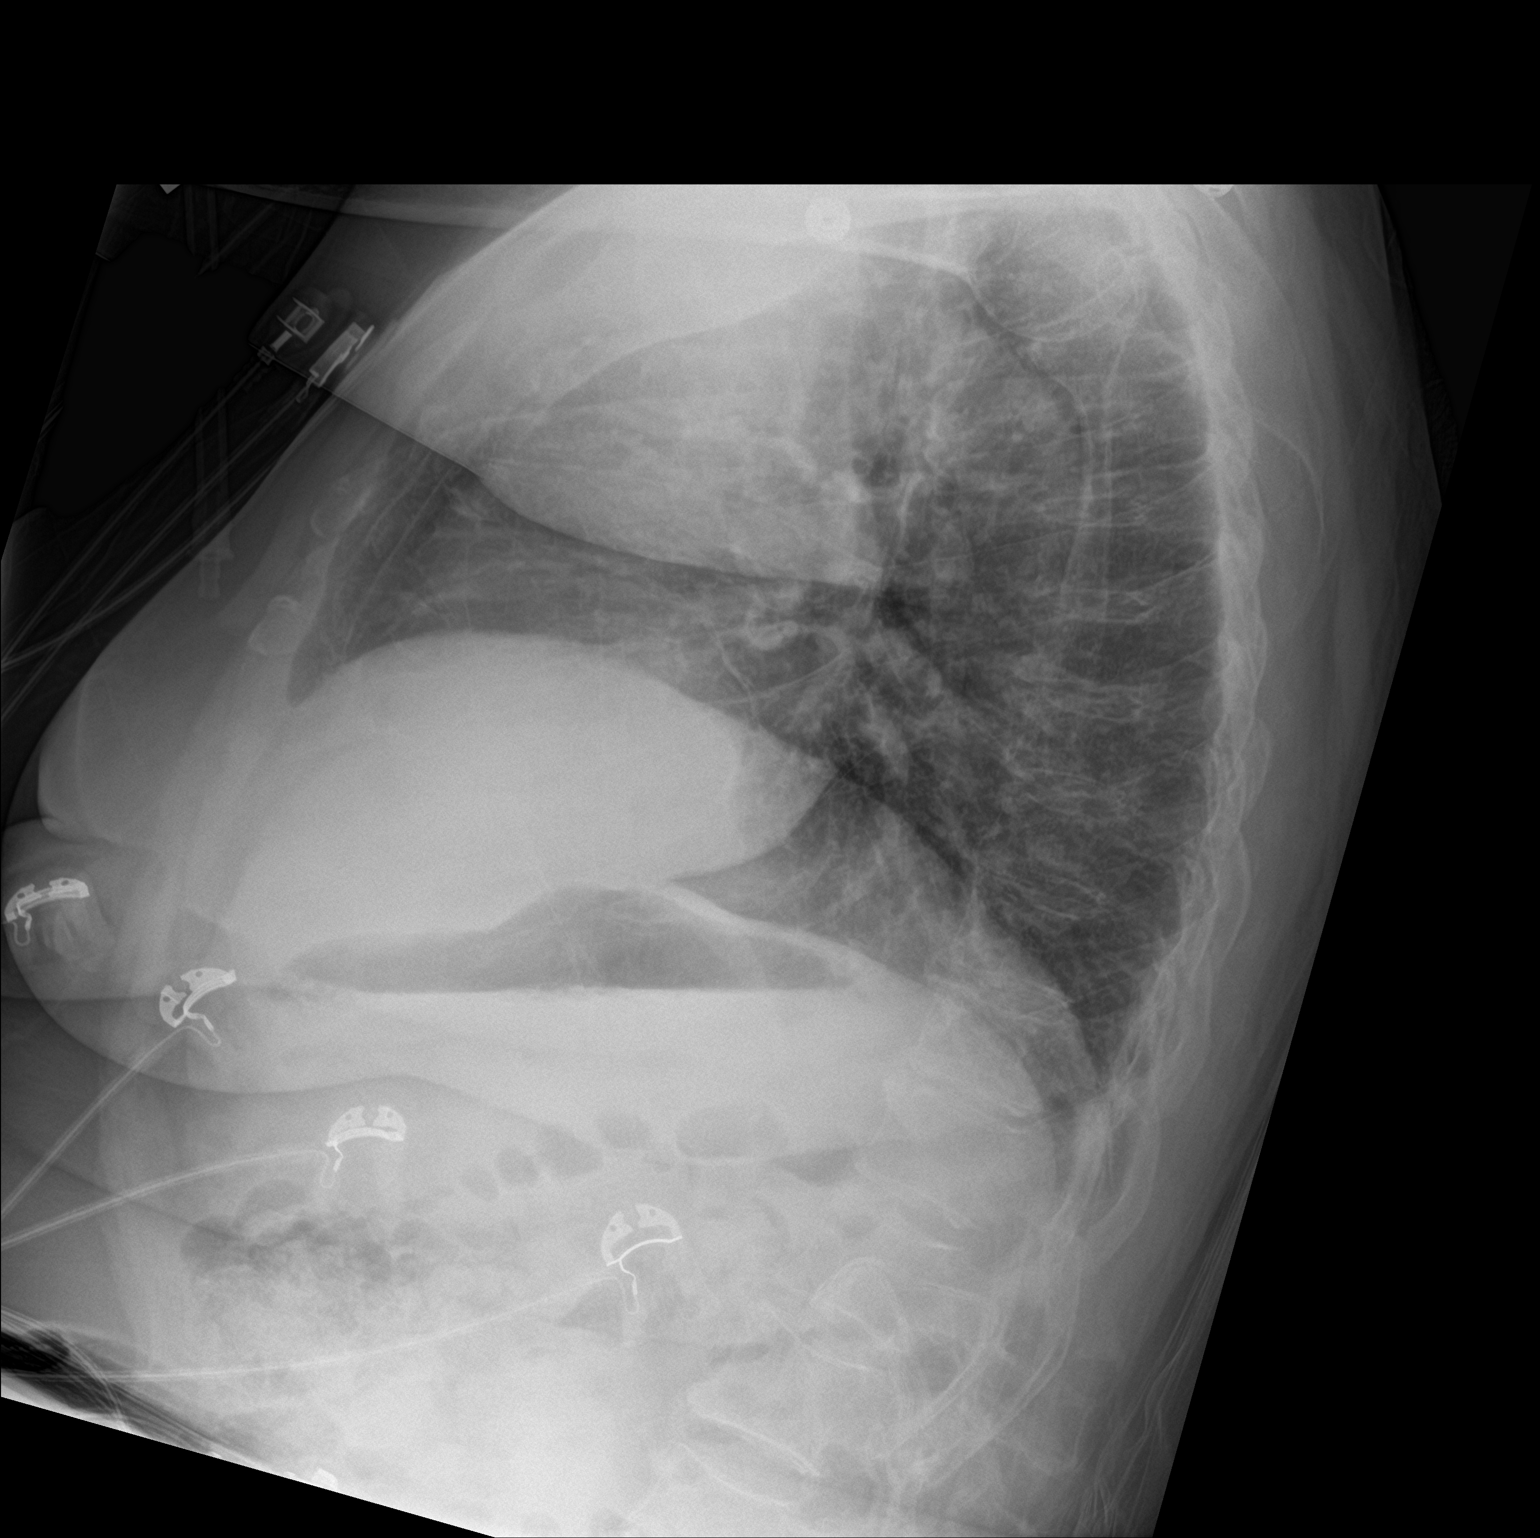

[chest ap]
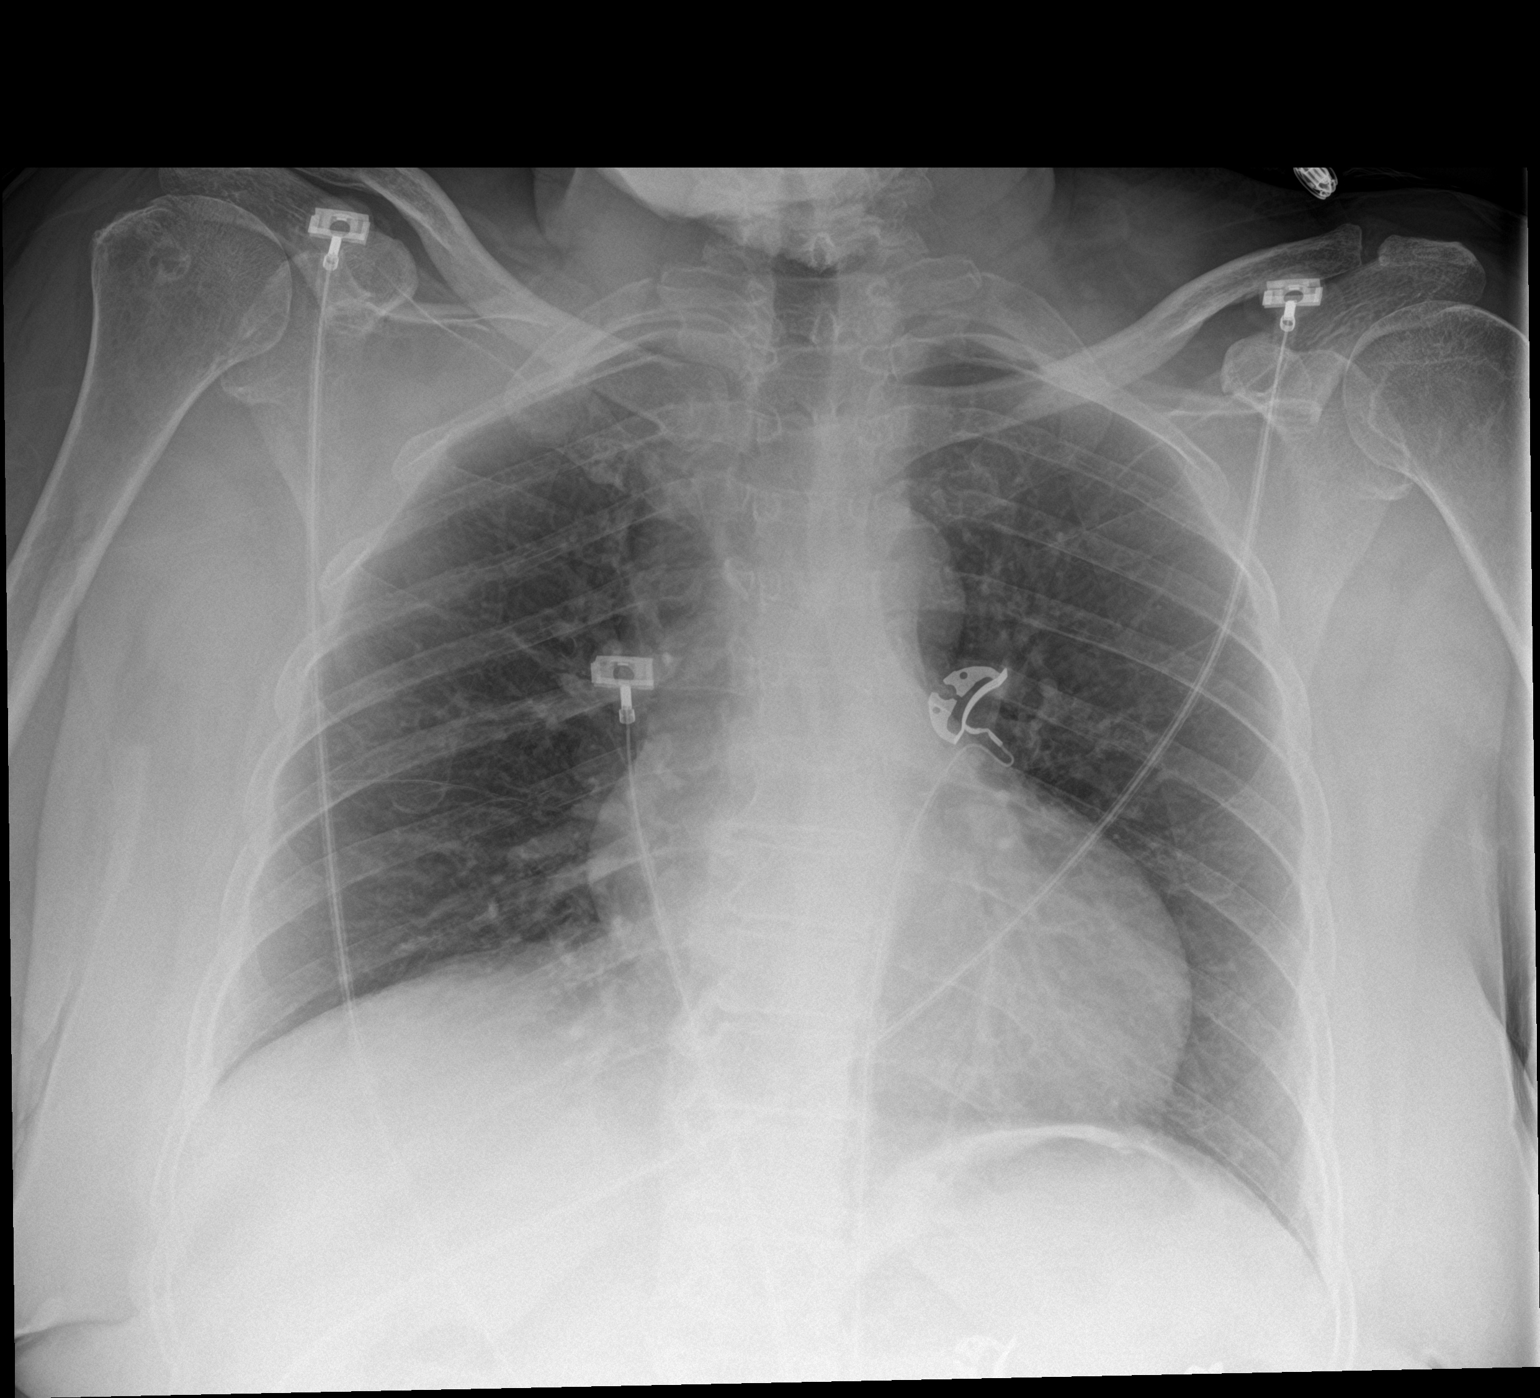

[2 of 2 positions shown; findings below may reference images not displayed]

FINDINGS: The heart size and mediastinal contours are within normal limits.
Both lungs are clear. Mild to moderate thoracic spondylosis. No
pleural effusion.
IMPRESSION: 1.  No active cardiopulmonary disease is radiographically apparent.
2. Thoracic spondylosis.

## 2018-12-23 ENCOUNTER — Other Ambulatory Visit: Payer: Self-pay

## 2018-12-23 ENCOUNTER — Encounter (HOSPITAL_COMMUNITY): Payer: Self-pay

## 2018-12-23 ENCOUNTER — Emergency Department (HOSPITAL_COMMUNITY): Payer: 59

## 2018-12-23 ENCOUNTER — Emergency Department (HOSPITAL_COMMUNITY)
Admission: EM | Admit: 2018-12-23 | Discharge: 2018-12-23 | Disposition: A | Discharge status: Home / Self Care | Payer: 59 | Attending: Emergency Medicine | Admitting: Emergency Medicine

## 2018-12-23 DIAGNOSIS — R1030 Lower abdominal pain, unspecified: Secondary | ICD-10-CM | POA: Insufficient documentation

## 2018-12-23 DIAGNOSIS — R1031 Right lower quadrant pain: Secondary | ICD-10-CM | POA: Diagnosis not present

## 2018-12-23 DIAGNOSIS — R1032 Left lower quadrant pain: Secondary | ICD-10-CM | POA: Diagnosis not present

## 2018-12-23 LAB — COMPREHENSIVE METABOLIC PANEL
ALT: 12 U/L (ref 0–44)
AST: 19 U/L (ref 15–41)
Albumin: 3.6 g/dL (ref 3.5–5.0)
Alkaline Phosphatase: 81 U/L (ref 38–126)
Anion gap: 9 (ref 5–15)
BUN: 8 mg/dL (ref 8–23)
CO2: 27 mmol/L (ref 22–32)
Calcium: 9.2 mg/dL (ref 8.9–10.3)
Chloride: 102 mmol/L (ref 98–111)
Creatinine, Ser: 0.64 mg/dL (ref 0.44–1.00)
GFR calc Af Amer: >60 mL/min (ref >60)
GFR calc non Af Amer: >60 mL/min (ref >60)
Glucose, Bld: 113 mg/dL — ABNORMAL HIGH (ref 70–99)
Potassium: 3.2 mmol/L — ABNORMAL LOW (ref 3.5–5.1)
Sodium: 138 mmol/L (ref 135–145)
Total Bilirubin: 1.5 mg/dL — ABNORMAL HIGH (ref 0.3–1.2)
Total Protein: 7.6 g/dL (ref 6.5–8.1)

## 2018-12-23 LAB — LIPASE, BLOOD: Lipase: 37 U/L (ref 11–51)

## 2018-12-23 LAB — URINALYSIS, ROUTINE W REFLEX MICROSCOPIC
Bacteria, UA: NONE SEEN
Bilirubin Urine: NEGATIVE
Glucose, UA: NEGATIVE mg/dL
Ketones, ur: NEGATIVE mg/dL
Leukocytes,Ua: NEGATIVE
Nitrite: NEGATIVE
Protein, ur: NEGATIVE mg/dL
Specific Gravity, Urine: 1.021 (ref 1.005–1.030)
pH: 5 (ref 5.0–8.0)

## 2018-12-23 LAB — CBC
HCT: 36.8 % (ref 36.0–46.0)
Hemoglobin: 11.7 g/dL — ABNORMAL LOW (ref 12.0–15.0)
MCH: 31.5 pg (ref 26.0–34.0)
MCHC: 31.8 g/dL (ref 30.0–36.0)
MCV: 98.9 fL (ref 80.0–100.0)
Platelets: 275 10*3/uL (ref 150–400)
RBC: 3.72 MIL/uL — ABNORMAL LOW (ref 3.87–5.11)
RDW: 12.3 % (ref 11.5–15.5)
WBC: 7.4 10*3/uL (ref 4.0–10.5)
nRBC: 0 % (ref 0.0–0.2)

## 2018-12-23 MED ORDER — SIMETHICONE 80 MG PO CHEW: 80.0000 mg | CHEWABLE_TABLET | Freq: Four times a day (QID) | ORAL | 0 refills | Status: DC | PRN

## 2018-12-23 MED ORDER — IOHEXOL 300 MG/ML  SOLN
100.0000 mL | Freq: Once | INTRAMUSCULAR | Status: AC | PRN
  Administered 2018-12-23: 100 mL via INTRAVENOUS

## 2018-12-23 MED ORDER — POTASSIUM CHLORIDE CRYS ER 20 MEQ PO TBCR
40.0000 meq | EXTENDED_RELEASE_TABLET | Freq: Once | ORAL | Status: AC
  Administered 2018-12-23: 40 meq via ORAL
  Filled 2018-12-23: qty 2

## 2018-12-23 MED ORDER — FENTANYL CITRATE (PF) 100 MCG/2ML IJ SOLN
50.0000 ug | Freq: Once | INTRAMUSCULAR | Status: AC
  Administered 2018-12-23: 50 ug via INTRAVENOUS
  Filled 2018-12-23: qty 2

## 2018-12-23 MED ORDER — SODIUM CHLORIDE 0.9% FLUSH: 3.0000 mL | Freq: Once | INTRAVENOUS | Status: DC

## 2018-12-23 NOTE — ED Triage Notes (Signed)
Pt presents to ED with lower abdominal pain which started yesterday. Pt denies N/V/D.

## 2018-12-23 NOTE — ED Notes (Signed)
Pt. Transported to CT 

## 2018-12-23 NOTE — ED Provider Notes (Signed)
[facility] EMERGENCY DEPARTMENT Provider Note   CSN: [id number] Arrival date & time: 12/23/18  1241    History   Chief Complaint Chief Complaint  Patient presents with  . Abdominal Pain    HPI Wendy Mccann is a 73 y.o. female presents for evaluation of acute onset, progressively worsening lower abdominal pain for 4 days.  She reports that symptoms began on Friday after drinking an energy drink at a grocery store.  She reports constant severe sharp abdominal pain along the lower abdomen which worsens with movements.  Denies nausea, vomiting, fever, chills, urinary symptoms, diarrhea, constipation, melena, hematochezia, chest pain, or shortness of breath.  She has been taking Tylenol without relief of her symptoms.     The history is provided by the patient.    Past Medical History:  Diagnosis Date  . Medical history non-contributory   . Rotator cuff (capsule) sprain     Patient Active Problem List   Diagnosis Date Noted  . Generalized weakness 08/12/2016  . Orthostatic hypotension 08/12/2016  . Abdominal pain 08/12/2016  . Cough 08/12/2016  . Dehydration, mild 08/12/2016    Past Surgical History:  Procedure Laterality Date  . ankles       OB History    Gravida  0   Para  0   Term  0   Preterm  0   AB  0   Living  0     SAB  0   TAB  0   Ectopic  0   Multiple  0   Live Births  0            Home Medications    Prior to Admission medications   Medication Sig Start Date End Date Taking? Authorizing Provider  acetaminophen (TYLENOL) 500 MG tablet Take 1 tablet (500 mg total) by mouth every 6 (six) hours as needed. 05/19/18   Law, Waylan Boga, PA-C  magnesium citrate SOLN Drink one half of a bottle.  If you do not have a bowel movement in 4-6 hours then drink the other half of the bottle. 12/25/16   Bethann Berkshire, MD  Phenyleph-CPM-DM-APAP (ALKA-SELTZER PLUS COLD & COUGH) 11-14-08-325 MG CAPS Take 1 capsule by mouth once as needed.    [provider]  simethicone (MYLICON) 80 MG chewable tablet Chew 1 tablet (80 mg total) by mouth every 6 (six) hours as needed for flatulence. 12/23/18   Jeanie Sewer, PA-C    Family History No family history on file.  Social History Social History   Tobacco Use  . Smoking status: Never Smoker  . Smokeless tobacco: Never Used  Substance Use Topics  . Alcohol use: No  . Drug use: No     Allergies   Patient has no known allergies.   Review of Systems Review of Systems  Constitutional: Negative for chills and fever.  Respiratory: Negative for cough and shortness of breath.   Cardiovascular: Negative for chest pain.  Gastrointestinal: Positive for abdominal pain. Negative for blood in stool, constipation, diarrhea, nausea and vomiting.  Genitourinary: Negative for dysuria, frequency, hematuria and urgency.  All other systems reviewed and are negative.    Physical Exam Updated Vital Signs BP (!) 119/54 (BP Location: Right Arm)   Pulse 76   Temp 97.8 F (36.6 C) (Oral)   Resp 17   Ht 5\' 1"  (1.549 m)   Wt 63 kg   SpO2 99%   BMI 26.26 kg/m   Physical Exam Vitals signs  and nursing note reviewed.  Constitutional:      General: She is not in acute distress.    Appearance: She is well-developed.     Comments: Appears uncomfortable  HENT:     Head: Normocephalic and atraumatic.  Eyes:     General:        Right eye: No discharge.        Left eye: No discharge.     Conjunctiva/sclera: Conjunctivae normal.  Neck:     Vascular: No JVD.     Trachea: No tracheal deviation.  Cardiovascular:     Rate and Rhythm: Normal rate and regular rhythm.  Pulmonary:     Effort: Pulmonary effort is normal.     Breath sounds: Normal breath sounds.  Abdominal:     General: Abdomen is protuberant. Bowel sounds are increased. There is no distension.     Palpations: Abdomen is soft.     Tenderness: There is abdominal tenderness in the right lower quadrant, suprapubic area and left  lower quadrant. There is no guarding or rebound.  Skin:    General: Skin is warm and dry.     Findings: No erythema.  Neurological:     Mental Status: She is alert.  Psychiatric:        Behavior: Behavior normal.      ED Treatments / Results  Labs (all labs ordered are listed, but only abnormal results are displayed) Labs Reviewed  COMPREHENSIVE METABOLIC PANEL - Abnormal; Notable for the following components:      Result Value   Potassium 3.2 (*)    Glucose, Bld 113 (*)    Total Bilirubin 1.5 (*)    All other components within normal limits  CBC - Abnormal; Notable for the following components:   RBC 3.72 (*)    Hemoglobin 11.7 (*)    All other components within normal limits  URINALYSIS, ROUTINE W REFLEX MICROSCOPIC - Abnormal; Notable for the following components:   Hgb urine dipstick MODERATE (*)    All other components within normal limits  LIPASE, BLOOD    EKG None  Radiology Ct Abdomen Pelvis W Contrast  Result Date: 12/23/2018 CLINICAL DATA:  Acute lower abdominal pain. EXAM: CT ABDOMEN AND PELVIS WITH CONTRAST TECHNIQUE: Multidetector CT imaging of the abdomen and pelvis was performed using the standard protocol following bolus administration of intravenous contrast. CONTRAST:  178mL OMNIPAQUE IOHEXOL 300 MG/ML  SOLN COMPARISON:  CT scan of December 25, 2016. FINDINGS: Lower chest: No acute abnormality. Hepatobiliary: No focal liver abnormality is seen. No gallstones, gallbladder wall thickening, or biliary dilatation. Pancreas: Unremarkable. No pancreatic ductal dilatation or surrounding inflammatory changes. Spleen: Normal in size without focal abnormality. Adrenals/Urinary Tract: Adrenal glands are unremarkable. Kidneys are normal, without renal calculi, focal lesion, or hydronephrosis. Bladder is unremarkable. Stomach/Bowel: Stomach is within normal limits. Appendix appears normal. No evidence of bowel wall thickening, distention, or inflammatory changes.  Vascular/Lymphatic: Aortic atherosclerosis. No enlarged abdominal or pelvic lymph nodes. Reproductive: Uterus and bilateral adnexa are unremarkable. Other: No abdominal wall hernia or abnormality. No abdominopelvic ascites. Musculoskeletal: Stable anterolisthesis of L5-S1 is noted secondary to bilateral L5 spondylolysis. IMPRESSION: No acute abnormality seen in the abdomen or pelvis. Stable anterolisthesis of L5-S1 secondary to bilateral L5 spondylolysis. Aortic Atherosclerosis (ICD10-I70.0). Electronically Signed   By: Marijo Conception M.D.   On: 12/23/2018 17:59    Procedures Procedures (including critical care time)  Medications Ordered in ED Medications  sodium chloride flush (NS) 0.9 % injection 3 mL (  Intravenous Canceled Entry 12/23/18 1723)  potassium chloride SA (K-DUR) CR tablet 40 mEq (has no administration in time range)  fentaNYL (SUBLIMAZE) injection 50 mcg (50 mcg Intravenous Given 12/23/18 1605)  iohexol (OMNIPAQUE) 300 MG/ML solution 100 mL (100 mLs Intravenous Contrast Given 12/23/18 1707)     Initial Impression / Assessment and Plan / ED Course  I have reviewed the triage vital signs and the nursing notes.  Pertinent labs & imaging results that were available during my care of the patient were reviewed by me and considered in my medical decision making (see chart for details).  Patient presenting for evaluation of lower abdominal pain after drinking an energy drink a few days ago.  She is afebrile, vital signs are stable.  She is uncomfortable but nontoxic in appearance.  No peritoneal signs on examination of the abdomen.  Lab work reviewed by me shows no leukocytosis, mild anemia, mild hypokalemia, no metabolic derangements, no renal insufficiency.  She was given p.o. potassium in the ED for her hypokalemia.  CT of the abdomen and pelvis shows no acute intra-abdominal pathology.  No evidence of acute surgical abdominal pathology including appendicitis, cholecystitis, dissection,  obstruction, or perforation.  On reevaluation she is resting comfortably no apparent distress, reports she is feeling better.  No further emergent work-up required at this time.  Stable for discharge home with follow-up with PCP for reevaluation of symptoms.  Discussed tricked ED return precautions. Pt verbalized understanding of and agreement with plan and is safe for discharge home at this time. Patient seen and evaluated by Dr. Jacqulyn BathLong who agrees with assessment and plan at this time.   Final Clinical Impressions(s) / ED Diagnoses   Final diagnoses:  Lower abdominal pain    ED Discharge Orders         Ordered    simethicone (MYLICON) 80 MG chewable tablet  Every 6 hours PRN     12/23/18 1824           Jeanie SewerFawze, Ferdie Bakken A, PA-C 12/23/18 1828    Maia PlanLong, Joshua G, MD 12/24/18 1004

## 2018-12-23 NOTE — ED Notes (Signed)
Pt returned from CT °

## 2018-12-23 NOTE — Discharge Instructions (Signed)
Take 1 to 2 tablets of extra strength Tylenol every 6 hours as needed for pain.  Do not exceed 4000 mg of Tylenol daily.  Drink plenty water get plenty of rest.  Eat a diet of bland foods that will not upset your stomach.  Avoid spicy foods, fried foods, fatty foods, or alcohol.  You may find it helpful to take Maalox or Carafate to help settle your stomach.  Follow-up with your primary care provider for reevaluation of your symptoms.  Return to the emergency department if any concerning signs or symptoms develop as fevers, persistent vomiting, or worsening pain.

## 2019-01-22 ENCOUNTER — Inpatient Hospital Stay (HOSPITAL_COMMUNITY)
Admission: EM | Admit: 2019-01-22 | Discharge: 2019-01-27 | DRG: 640 | Disposition: A | Discharge status: Skilled Nursing Facility | Payer: 59 | Attending: Family Medicine | Admitting: Family Medicine

## 2019-01-22 ENCOUNTER — Encounter (HOSPITAL_COMMUNITY): Payer: Self-pay | Admitting: Emergency Medicine

## 2019-01-22 ENCOUNTER — Emergency Department (HOSPITAL_COMMUNITY): Payer: 59

## 2019-01-22 ENCOUNTER — Other Ambulatory Visit: Payer: Self-pay

## 2019-01-22 DIAGNOSIS — G9341 Metabolic encephalopathy: Secondary | ICD-10-CM | POA: Diagnosis present

## 2019-01-22 DIAGNOSIS — E869 Volume depletion, unspecified: Secondary | ICD-10-CM | POA: Diagnosis not present

## 2019-01-22 DIAGNOSIS — N200 Calculus of kidney: Secondary | ICD-10-CM | POA: Diagnosis not present

## 2019-01-22 DIAGNOSIS — R110 Nausea: Secondary | ICD-10-CM | POA: Diagnosis not present

## 2019-01-22 DIAGNOSIS — Z1159 Encounter for screening for other viral diseases: Secondary | ICD-10-CM | POA: Diagnosis not present

## 2019-01-22 DIAGNOSIS — E876 Hypokalemia: Principal | ICD-10-CM

## 2019-01-22 DIAGNOSIS — D649 Anemia, unspecified: Secondary | ICD-10-CM | POA: Diagnosis present

## 2019-01-22 DIAGNOSIS — E878 Other disorders of electrolyte and fluid balance, not elsewhere classified: Secondary | ICD-10-CM | POA: Diagnosis present

## 2019-01-22 DIAGNOSIS — R109 Unspecified abdominal pain: Secondary | ICD-10-CM | POA: Diagnosis present

## 2019-01-22 DIAGNOSIS — R17 Unspecified jaundice: Secondary | ICD-10-CM | POA: Diagnosis present

## 2019-01-22 DIAGNOSIS — R1030 Lower abdominal pain, unspecified: Secondary | ICD-10-CM | POA: Diagnosis not present

## 2019-01-22 DIAGNOSIS — R2681 Unsteadiness on feet: Secondary | ICD-10-CM | POA: Diagnosis present

## 2019-01-22 DIAGNOSIS — R531 Weakness: Secondary | ICD-10-CM

## 2019-01-22 DIAGNOSIS — R1084 Generalized abdominal pain: Secondary | ICD-10-CM | POA: Diagnosis not present

## 2019-01-22 DIAGNOSIS — R81 Glycosuria: Secondary | ICD-10-CM | POA: Diagnosis present

## 2019-01-22 DIAGNOSIS — K5790 Diverticulosis of intestine, part unspecified, without perforation or abscess without bleeding: Secondary | ICD-10-CM | POA: Diagnosis present

## 2019-01-22 DIAGNOSIS — R9431 Abnormal electrocardiogram [ECG] [EKG]: Secondary | ICD-10-CM | POA: Diagnosis not present

## 2019-01-22 DIAGNOSIS — Z825 Family history of asthma and other chronic lower respiratory diseases: Secondary | ICD-10-CM

## 2019-01-22 DIAGNOSIS — S43429D Sprain of unspecified rotator cuff capsule, subsequent encounter: Secondary | ICD-10-CM | POA: Diagnosis not present

## 2019-01-22 DIAGNOSIS — Z9181 History of falling: Secondary | ICD-10-CM

## 2019-01-22 DIAGNOSIS — R823 Hemoglobinuria: Secondary | ICD-10-CM | POA: Diagnosis present

## 2019-01-22 DIAGNOSIS — F0390 Unspecified dementia without behavioral disturbance: Secondary | ICD-10-CM | POA: Diagnosis present

## 2019-01-22 DIAGNOSIS — Z03818 Encounter for observation for suspected exposure to other biological agents ruled out: Secondary | ICD-10-CM | POA: Diagnosis not present

## 2019-01-22 LAB — COMPREHENSIVE METABOLIC PANEL
ALT: 14 U/L (ref 0–44)
AST: 22 U/L (ref 15–41)
Albumin: 3.8 g/dL (ref 3.5–5.0)
Alkaline Phosphatase: 72 U/L (ref 38–126)
Anion gap: 9 (ref 5–15)
BUN: 13 mg/dL (ref 8–23)
CO2: 26 mmol/L (ref 22–32)
Calcium: 9.3 mg/dL (ref 8.9–10.3)
Chloride: 103 mmol/L (ref 98–111)
Creatinine, Ser: 0.94 mg/dL (ref 0.44–1.00)
GFR calc Af Amer: >60 mL/min (ref >60)
GFR calc non Af Amer: >60 mL/min (ref >60)
Glucose, Bld: 127 mg/dL — ABNORMAL HIGH (ref 70–99)
Potassium: 2.6 mmol/L — CL (ref 3.5–5.1)
Sodium: 138 mmol/L (ref 135–145)
Total Bilirubin: 1.8 mg/dL — ABNORMAL HIGH (ref 0.3–1.2)
Total Protein: 8.3 g/dL — ABNORMAL HIGH (ref 6.5–8.1)

## 2019-01-22 LAB — CBC WITH DIFFERENTIAL/PLATELET
Abs Immature Granulocytes: 0.04 10*3/uL (ref 0.00–0.07)
Basophils Absolute: 0 10*3/uL (ref 0.0–0.1)
Basophils Relative: 0 %
Eosinophils Absolute: 0 10*3/uL (ref 0.0–0.5)
Eosinophils Relative: 0 %
HCT: 36 % (ref 36.0–46.0)
Hemoglobin: 11.7 g/dL — ABNORMAL LOW (ref 12.0–15.0)
Immature Granulocytes: 0 %
Lymphocytes Relative: 11 %
Lymphs Abs: 1 10*3/uL (ref 0.7–4.0)
MCH: 31.5 pg (ref 26.0–34.0)
MCHC: 32.5 g/dL (ref 30.0–36.0)
MCV: 96.8 fL (ref 80.0–100.0)
Monocytes Absolute: 1.4 10*3/uL — ABNORMAL HIGH (ref 0.1–1.0)
Monocytes Relative: 16 %
Neutro Abs: 6.5 10*3/uL (ref 1.7–7.7)
Neutrophils Relative %: 73 %
Platelets: 261 10*3/uL (ref 150–400)
RBC: 3.72 MIL/uL — ABNORMAL LOW (ref 3.87–5.11)
RDW: 12.4 % (ref 11.5–15.5)
WBC: 8.9 10*3/uL (ref 4.0–10.5)
nRBC: 0 % (ref 0.0–0.2)

## 2019-01-22 LAB — URINALYSIS, MICROSCOPIC (REFLEX)

## 2019-01-22 LAB — PHOSPHORUS: Phosphorus: 2.9 mg/dL (ref 2.5–4.6)

## 2019-01-22 LAB — URINALYSIS, ROUTINE W REFLEX MICROSCOPIC
Glucose, UA: 100 mg/dL — AB
Leukocytes,Ua: NEGATIVE
Nitrite: NEGATIVE
Protein, ur: 30 mg/dL — AB
Specific Gravity, Urine: >1.03 — ABNORMAL HIGH (ref 1.005–1.030)
pH: 6 (ref 5.0–8.0)

## 2019-01-22 LAB — LIPASE, BLOOD: Lipase: 32 U/L (ref 11–51)

## 2019-01-22 LAB — SARS CORONAVIRUS 2 BY RT PCR (HOSPITAL ORDER, PERFORMED IN ~~LOC~~ HOSPITAL LAB): SARS Coronavirus 2: NEGATIVE

## 2019-01-22 LAB — MAGNESIUM: Magnesium: 2.3 mg/dL (ref 1.7–2.4)

## 2019-01-22 MED ORDER — ENOXAPARIN SODIUM 40 MG/0.4ML ~~LOC~~ SOLN
40.0000 mg | SUBCUTANEOUS | Status: DC
  Administered 2019-01-23 – 2019-01-26 (×5): 40 mg via SUBCUTANEOUS
  Filled 2019-01-22 (×5): qty 0.4

## 2019-01-22 MED ORDER — IOHEXOL 300 MG/ML  SOLN
100.0000 mL | Freq: Once | INTRAMUSCULAR | Status: AC | PRN
  Administered 2019-01-22: 100 mL via INTRAVENOUS

## 2019-01-22 MED ORDER — POTASSIUM CHLORIDE IN NACL 40-0.9 MEQ/L-% IV SOLN
INTRAVENOUS | Status: DC
  Administered 2019-01-23: 100 mL/h via INTRAVENOUS
  Filled 2019-01-22: qty 1000

## 2019-01-22 MED ORDER — ONDANSETRON HCL 4 MG PO TABS: 4.0000 mg | ORAL_TABLET | Freq: Four times a day (QID) | ORAL | Status: DC | PRN

## 2019-01-22 MED ORDER — SODIUM CHLORIDE 0.9 % IV BOLUS
500.0000 mL | Freq: Once | INTRAVENOUS | Status: AC
  Administered 2019-01-22: 500 mL via INTRAVENOUS

## 2019-01-22 MED ORDER — MAGNESIUM SULFATE 2 GM/50ML IV SOLN
2.0000 g | Freq: Once | INTRAVENOUS | Status: AC
  Administered 2019-01-23: 2 g via INTRAVENOUS
  Filled 2019-01-22: qty 50

## 2019-01-22 MED ORDER — ONDANSETRON HCL 4 MG/2ML IJ SOLN: 4.0000 mg | Freq: Four times a day (QID) | INTRAMUSCULAR | Status: DC | PRN

## 2019-01-22 MED ORDER — POTASSIUM CHLORIDE CRYS ER 20 MEQ PO TBCR
40.0000 meq | EXTENDED_RELEASE_TABLET | Freq: Once | ORAL | Status: AC
  Administered 2019-01-22: 40 meq via ORAL
  Filled 2019-01-22: qty 2

## 2019-01-22 MED ORDER — POTASSIUM CHLORIDE 10 MEQ/100ML IV SOLN
10.0000 meq | Freq: Once | INTRAVENOUS | Status: AC
  Administered 2019-01-22: 10 meq via INTRAVENOUS
  Filled 2019-01-22: qty 100

## 2019-01-22 MED ORDER — ACETAMINOPHEN 325 MG PO TABS
650.0000 mg | ORAL_TABLET | Freq: Four times a day (QID) | ORAL | Status: DC | PRN
  Administered 2019-01-22 – 2019-01-26 (×4): 650 mg via ORAL
  Filled 2019-01-22 (×4): qty 2

## 2019-01-22 MED ORDER — HYDROMORPHONE HCL 1 MG/ML IJ SOLN
0.5000 mg | Freq: Once | INTRAMUSCULAR | Status: AC
  Administered 2019-01-22: 0.5 mg via INTRAVENOUS
  Filled 2019-01-22: qty 1

## 2019-01-22 MED ORDER — ACETAMINOPHEN 650 MG RE SUPP: 650.0000 mg | Freq: Four times a day (QID) | RECTAL | Status: DC | PRN

## 2019-01-22 NOTE — ED Notes (Signed)
ED TO INPATIENT HANDOFF REPORT  ED Nurse Name and Phone #:  Marisa Hua Roslynn Holte, RN  S Name/Age/Gender Wendy SchlichterGloria J Mccann 73 y.o. female Room/Bed: APA10/APA10  Code Status   Code Status: Full Code  Home/SNF/Other Home Patient oriented to: self, place, time and situation Is this baseline? Yes   Triage Complete: Triage complete  Chief Complaint Abdomial Pain  Triage Note Pt states that she has been having lower abd pain for the past 2 weeks before today she would have a bm or urinate and would get a day of relief but today she did not get that releif.    Allergies No Known Allergies  Level of Care/Admitting Diagnosis ED Disposition    ED Disposition Condition Comment   Admit  [facility]ospital [facility] [[id number]]  Level of Care: Telemetry [5]  Covid Evaluation: Asymptomatic Screening Protocol (No Symptoms)  Diagnosis: Hypokalemia [161096][172180]  Admitting Physician: Bobette MoORTIZ, DAVID MANUEL [[id number]]  Attending Physician: Bobette MoTIZ, DAVID MANUEL [[id number]]  PT Class (Do Not Modify): Observation [104]  PT Acc Code (Do Not Modify): Observation [10022]       B Medical/Surgery History Past Medical History:  Diagnosis Date  . Medical history non-contributory   . Rotator cuff (capsule) sprain    Past Surgical History:  Procedure Laterality Date  . ankles       A IV Location/Drains/Wounds Patient Lines/Drains/Airways Status   Active Line/Drains/Airways    Name:   Placement date:   Placement time:   Site:   Days:   Peripheral IV 01/22/19 Left Antecubital   01/22/19    1922    Antecubital   less than 1          Intake/Output Last 24 hours  Intake/Output Summary (Last 24 hours) at 01/22/2019 2318 Last data filed at 01/22/2019 2227 Gross per 24 hour  Intake 100 ml  Output -  Net 100 ml    Labs/Imaging Results for orders placed or performed during the hospital encounter of 01/22/19 (from the past 48 hour(s))  CBC with Differential/Platelet     Status: Abnormal   Collection  Time: 01/22/19  7:22 PM  Result Value Ref Range   WBC 8.9 4.0 - 10.5 K/uL   RBC 3.72 (L) 3.87 - 5.11 MIL/uL   Hemoglobin 11.7 (L) 12.0 - 15.0 g/dL   HCT 29.536.0 62.136.0 - 30.846.0 %   MCV 96.8 80.0 - 100.0 fL   MCH 31.5 26.0 - 34.0 pg   MCHC 32.5 30.0 - 36.0 g/dL   RDW 65.712.4 84.611.5 - 96.215.5 %   Platelets 261 150 - 400 K/uL   nRBC 0.0 0.0 - 0.2 %   Neutrophils Relative % 73 %   Neutro Abs 6.5 1.7 - 7.7 K/uL   Lymphocytes Relative 11 %   Lymphs Abs 1.0 0.7 - 4.0 K/uL   Monocytes Relative 16 %   Monocytes Absolute 1.4 (H) 0.1 - 1.0 K/uL   Eosinophils Relative 0 %   Eosinophils Absolute 0.0 0.0 - 0.5 K/uL   Basophils Relative 0 %   Basophils Absolute 0.0 0.0 - 0.1 K/uL   Immature Granulocytes 0 %   Abs Immature Granulocytes 0.04 0.00 - 0.07 K/uL    Comment: Performed at [facility], [address] [place], [place] [address]  Comprehensive metabolic panel     Status: Abnormal   Collection Time: 01/22/19  7:22 PM  Result Value Ref Range   Sodium 138 135 - 145 mmol/L   Potassium 2.6 (LL) 3.5 - 5.1 mmol/L  Comment: CRITICAL RESULT CALLED TO, READ BACK BY AND VERIFIED WITH: DOSS,M ON 01/22/19 AT 2005 BY LOY,C    Chloride 103 98 - 111 mmol/L   CO2 26 22 - 32 mmol/L   Glucose, Bld 127 (H) 70 - 99 mg/dL   BUN 13 8 - 23 mg/dL   Creatinine, Ser 9.56 0.44 - 1.00 mg/dL   Calcium 9.3 8.9 - 21.3 mg/dL   Total Protein 8.3 (H) 6.5 - 8.1 g/dL   Albumin 3.8 3.5 - 5.0 g/dL   AST 22 15 - 41 U/L   ALT 14 0 - 44 U/L   Alkaline Phosphatase 72 38 - 126 U/L   Total Bilirubin 1.8 (H) 0.3 - 1.2 mg/dL   GFR calc non Af Amer >60 >60 mL/min   GFR calc Af Amer >60 >60 mL/min   Anion gap 9 5 - 15    Comment: Performed at [facility], [address], [place], [place] [address]  Lipase, blood     Status: None   Collection Time: 01/22/19  7:22 PM  Result Value Ref Range   Lipase 32 11 - 51 U/L    Comment: Performed at [facility], [address], [place], [place] [address]  Magnesium     Status: None   Collection  Time: 01/22/19  7:22 PM  Result Value Ref Range   Magnesium 2.3 1.7 - 2.4 mg/dL    Comment: Performed at [facility], [address] [place], [place] [address]  Phosphorus     Status: None   Collection Time: 01/22/19  7:22 PM  Result Value Ref Range   Phosphorus 2.9 2.5 - 4.6 mg/dL    Comment: Performed at [facility] [address] [place], [place] [address]  Urinalysis, Routine w reflex microscopic     Status: Abnormal   Collection Time: 01/22/19  8:58 PM  Result Value Ref Range   Color, Urine YELLOW YELLOW   APPearance CLEAR CLEAR   Specific Gravity, Urine >1.030 (H) 1.005 - 1.030   pH 6.0 5.0 - 8.0   Glucose, UA 100 (A) NEGATIVE mg/dL   Hgb urine dipstick MODERATE (A) NEGATIVE   Bilirubin Urine SMALL (A) NEGATIVE   Ketones, ur TRACE (A) NEGATIVE mg/dL   Protein, ur 30 (A) NEGATIVE mg/dL   Nitrite NEGATIVE NEGATIVE   Leukocytes,Ua NEGATIVE NEGATIVE    Comment: Performed at [facility], [address], [place], [place] [address]  Urinalysis, Microscopic (reflex)     Status: Abnormal   Collection Time: 01/22/19  8:58 PM  Result Value Ref Range   RBC / HPF 0-5 0 - 5 RBC/hpf   WBC, UA 0-5 0 - 5 WBC/hpf   Bacteria, UA RARE (A) NONE SEEN   Squamous Epithelial / LPF 11-20 0 - 5    Comment: Performed at [facility], [address], [place], [place] [address]   Ct Abdomen Pelvis W Contrast  Result Date: 01/22/2019 CLINICAL DATA:  Lower abdominal pain for 2 weeks EXAM: CT ABDOMEN AND PELVIS WITH CONTRAST TECHNIQUE: Multidetector CT imaging of the abdomen and pelvis was performed using the standard protocol following bolus administration of intravenous contrast. CONTRAST:  OMNIPAQUE IOHEXOL 300 MG/ML  SOLN COMPARISON:  12/23/2018 FINDINGS: Lower chest: Lung bases are well aerated with some minimal scarring bilaterally. Hepatobiliary: No focal liver abnormality is seen. No gallstones, gallbladder wall thickening, or biliary dilatation. Pancreas: Unremarkable. No pancreatic ductal  dilatation or surrounding inflammatory changes. Spleen: Normal in size without focal abnormality. Adrenals/Urinary Tract: Adrenal  glands are within normal limits. Kidneys demonstrate a normal enhancement pattern bilaterally. Tiny nonobstructing right lower pole renal stone is noted. Normal excretion of contrast is noted on delayed images. The bladder is well distended. Stomach/Bowel: Diverticular change of the colon is noted without evidence of diverticulitis. No obstructive or inflammatory changes are seen. The appendix is air-filled and within normal limits. No small bowel abnormality is noted. Stomach is unremarkable with the exception of a small sliding-type hiatal hernia. Vascular/Lymphatic: Aortic atherosclerosis. No enlarged abdominal or pelvic lymph nodes. Reproductive: Uterus and bilateral adnexa are unremarkable. Other: No abdominal wall hernia or abnormality. No abdominopelvic ascites. Musculoskeletal: Chronic anterolisthesis of L5 on S1 is seen. Disc space narrowing at L4-5 is noted. IMPRESSION: Tiny nonobstructing right lower pole renal stone. Diverticulosis without diverticulitis. No acute abnormality noted. Electronically Signed   By: Inez Catalina M.D.   On: 01/22/2019 21:35    Pending Labs Unresulted Labs (From admission, onward)    Start     Ordered   01/29/19 0500  Creatinine, serum  (enoxaparin (LOVENOX)    CrCl >/= 30 ml/min)  Weekly,   R    Comments: while on enoxaparin therapy    01/22/19 2259   01/23/19 0500  CBC  Tomorrow morning,   R     01/22/19 2259   01/23/19 5784  Basic metabolic panel  Tomorrow morning,   R     01/22/19 2259   01/22/19 2306  CK  Add-on,   AD     01/22/19 2305   01/22/19 2221  SARS Coronavirus 2 (CEPHEID- Performed in [place] hospital lab), Hosp Order  (Symptomatic Patients Labs with Precautions )  Once,   STAT     01/22/19 2220          Vitals/Pain Today's Vitals   01/22/19 2002 01/22/19 2030 01/22/19 2059 01/22/19 2232  BP:  138/64  130/66   Pulse:  96  87  Resp:  16  17  Temp:      TempSrc:      SpO2:  97%  99%  Weight:      Height:      PainSc: 0-No pain  0-No pain     Isolation Precautions Airborne and Contact precautions  Medications Medications  potassium chloride 10 mEq in 100 mL IVPB (10 mEq Intravenous New Bag/Given 01/22/19 2232)  potassium chloride SA (K-DUR) CR tablet 40 mEq (has no administration in time range)  magnesium sulfate IVPB 2 g 50 mL (has no administration in time range)  enoxaparin (LOVENOX) injection 40 mg (has no administration in time range)  ondansetron (ZOFRAN) tablet 4 mg (has no administration in time range)    Or  ondansetron (ZOFRAN) injection 4 mg (has no administration in time range)  acetaminophen (TYLENOL) tablet 650 mg (has no administration in time range)    Or  acetaminophen (TYLENOL) suppository 650 mg (has no administration in time range)  0.9 % NaCl with KCl 40 mEq / L  infusion (has no administration in time range)  HYDROmorphone (DILAUDID) injection 0.5 mg (0.5 mg Intravenous Given 01/22/19 1921)  potassium chloride 10 mEq in 100 mL IVPB (0 mEq Intravenous Stopped 01/22/19 2227)  sodium chloride 0.9 % bolus 500 mL (500 mLs Intravenous New Bag/Given 01/22/19 2055)  iohexol (OMNIPAQUE) 300 MG/ML solution 100 mL (100 mLs Intravenous Contrast Given 01/22/19 2112)    Mobility walks with device Low fall risk   Focused Assessments    R Recommendations: See Admitting Provider Note  Report  given to:   Additional Notes: Marisa Hua, RN

## 2019-01-22 NOTE — ED Notes (Addendum)
CRITICAL VALUE ALERT  Critical Value:  K+ 2.6  Date & Time Notied:  01/22/2019 2009  Provider Notified: Dr. Roderic Palau  Orders Received/Actions taken: see chart

## 2019-01-22 NOTE — H&P (Signed)
History and Physical    Wendy Mccann DOB: [birth year] DOA: 01/22/2019  PCP: [facility]   Patient coming from: Home.  I have personally briefly reviewed patient's old medical records in [facility]  Chief Complaint: Abdominal pain.  HPI: Wendy Mccann is a 73 y.o. female with medical history significant of rotator cuff capsule sprain who is coming to the emergency department due to a 2-week history of lower abdominal pain that usually gets relieved with a bowel movement or urination for a day or so, but today the pain persisted and worsened.  She feels fatigued.  Her stools are soft.  She denies nausea, emesis, constipation, melena or hematochezia.  No dysuria, frequency or hematuria.  She denies fever, chills, sore throat, rhinorrhea, dyspnea, wheezing or hemoptysis.  No chest pain, palpitations, diaphoresis, dizziness, PND, orthopnea or lower extremity edema.  No polyuria, polydipsia, polyphagia or blurred vision.  ED Course: Initial vital signs were Temperature 100.3 F, pulse 94, respirations 16, blood pressure 133/66 mmHg and O2 sat 97% on room air.  She received a 500 mL NS bolus, hydromorphone, oral and IV potassium replacement.  Her urinalysis showed increase in a specific gravity, glucosuria of 100 mg/dL, moderate hemoglobinuria, small bilirubin, trace ketones and proteinuria 30 mg/dL.  White count was 8.9, hemoglobin 11.7 g/dL and platelets 026.  CMP shows a potassium of 2.6 mmol/L.  Glucose 127 and bilirubin 1.8 mg/dL.  Total protein was 8.3 g/dL.  The rest of the CMP values are normal.  Lipase, magnesium and phosphorus within expected range.  Microscopic examination showed rare bacteria, but was otherwise normal.  Total CK was mildly elevated at 357 units/L.  Imaging: CT abdomen pelvis with contrast showed tiny nonobstructive right lower pole renal stone and diverticulosis without diverticulitis.  Please see images and full radiology report  for further detail.  Review of Systems: As per HPI otherwise 10 point review of systems negative.  Past Medical History:  Diagnosis Date   Medical history non-contributory    Rotator cuff (capsule) sprain     Past Surgical History:  Procedure Laterality Date   ankles       reports that she has never smoked. She has never used smokeless tobacco. She reports that she does not drink alcohol or use drugs.  No Known Allergies  Family medical history. Mother tobacco use. Father tobacco use and COPD.   Prior to Admission medications   Medication Sig Start Date End Date Taking? Authorizing Provider  acetaminophen (TYLENOL) 500 MG tablet Take 1 tablet (500 mg total) by mouth every 6 (six) hours as needed. 05/19/18  Yes Law, Alexandra M, PA-C  magnesium citrate SOLN Drink one half of a bottle.  If you do not have a bowel movement in 4-6 hours then drink the other half of the bottle. 12/25/16  Yes Bethann Berkshire, MD  Phenyleph-CPM-DM-APAP (ALKA-SELTZER PLUS COLD & COUGH) 11-14-08-325 MG CAPS Take 1 capsule by mouth once as needed.   Yes [provider]  simethicone (MYLICON) 80 MG chewable tablet Chew 1 tablet (80 mg total) by mouth every 6 (six) hours as needed for flatulence. 12/23/18  Yes Jeanie Sewer, PA-C    Physical Exam: Vitals:   01/22/19 1930 01/22/19 2000 01/22/19 2030 01/22/19 2232  BP: (!) 129/59 136/63 138/64 130/66  Pulse: 95 85 96 87  Resp: 17 17 16 17   Temp:      TempSrc:      SpO2: 95% 95% 97% 99%  Weight:      Height:        Constitutional: NAD, calm, comfortable Eyes: PERRL, lids and conjunctivae normal ENMT: Mucous membranes are mildly dry.  Posterior pharynx clear of any exudate or lesions. Neck: normal, supple, no masses, no thyromegaly Respiratory: Decreased breath sounds in bases, otherwise clear to auscultation bilaterally, no wheezing, no crackles. Normal respiratory effort. No accessory muscle use.  Cardiovascular: Regular rate and rhythm, no  murmurs / rubs / gallops. No extremity edema. 2+ pedal pulses. No carotid bruits.  Abdomen: Soft, positive LLQ tenderness, no guarding, rebound, masses palpated. No hepatosplenomegaly. Bowel sounds positive.  Musculoskeletal: no clubbing / cyanosis.  Good ROM, no contractures. Normal muscle tone.  Skin: no clinically significant rashes, lesions, ulcers on limited dermatological examination. Neurologic: CN 2-12 grossly intact. Sensation intact, DTR normal. Strength 5/5 in all 4.  Psychiatric: Normal judgment and insight. Alert and oriented x 3. Normal mood.   Labs on Admission: I have personally reviewed following labs and imaging studies  CBC: Recent Labs  Lab 01/22/19 1922  WBC 8.9  NEUTROABS 6.5  HGB 11.7*  HCT 36.0  MCV 96.8  PLT 629   Basic Metabolic Panel: Recent Labs  Lab 01/22/19 1922  NA 138  K 2.6*  CL 103  CO2 26  GLUCOSE 127*  BUN 13  CREATININE 0.94  CALCIUM 9.3   GFR: Estimated Creatinine Clearance: 46.6 mL/min (by C-G formula based on SCr of 0.94 mg/dL). Liver Function Tests: Recent Labs  Lab 01/22/19 1922  AST 22  ALT 14  ALKPHOS 72  BILITOT 1.8*  PROT 8.3*  ALBUMIN 3.8   Recent Labs  Lab 01/22/19 1922  LIPASE 32   No results for input(s): AMMONIA in the last 168 hours. Coagulation Profile: No results for input(s): INR, PROTIME in the last 168 hours. Cardiac Enzymes: No results for input(s): CKTOTAL, CKMB, CKMBINDEX, TROPONINI in the last 168 hours. BNP (last 3 results) No results for input(s): PROBNP in the last 8760 hours. HbA1C: No results for input(s): HGBA1C in the last 72 hours. CBG: No results for input(s): GLUCAP in the last 168 hours. Lipid Profile: No results for input(s): CHOL, HDL, LDLCALC, TRIG, CHOLHDL, LDLDIRECT in the last 72 hours. Thyroid Function Tests: No results for input(s): TSH, T4TOTAL, FREET4, T3FREE, THYROIDAB in the last 72 hours. Anemia Panel: No results for input(s): VITAMINB12, FOLATE, FERRITIN, TIBC,  IRON, RETICCTPCT in the last 72 hours. Urine analysis:    Component Value Date/Time   COLORURINE YELLOW 01/22/2019 2058   APPEARANCEUR CLEAR 01/22/2019 2058   LABSPEC >1.030 (H) 01/22/2019 2058   PHURINE 6.0 01/22/2019 2058   GLUCOSEU 100 (A) 01/22/2019 2058   HGBUR MODERATE (A) 01/22/2019 2058   BILIRUBINUR SMALL (A) 01/22/2019 2058   KETONESUR TRACE (A) 01/22/2019 2058   PROTEINUR 30 (A) 01/22/2019 2058   NITRITE NEGATIVE 01/22/2019 2058   LEUKOCYTESUR NEGATIVE 01/22/2019 2058    Radiological Exams on Admission: Ct Abdomen Pelvis W Contrast  Result Date: 01/22/2019 CLINICAL DATA:  Lower abdominal pain for 2 weeks EXAM: CT ABDOMEN AND PELVIS WITH CONTRAST TECHNIQUE: Multidetector CT imaging of the abdomen and pelvis was performed using the standard protocol following bolus administration of intravenous contrast. CONTRAST:  173mL OMNIPAQUE IOHEXOL 300 MG/ML  SOLN COMPARISON:  12/23/2018 FINDINGS: Lower chest: Lung bases are well aerated with some minimal scarring bilaterally. Hepatobiliary: No focal liver abnormality is seen. No gallstones, gallbladder wall thickening, or biliary dilatation. Pancreas: Unremarkable. No pancreatic ductal dilatation or surrounding inflammatory changes.  Spleen: Normal in size without focal abnormality. Adrenals/Urinary Tract: Adrenal glands are within normal limits. Kidneys demonstrate a normal enhancement pattern bilaterally. Tiny nonobstructing right lower pole renal stone is noted. Normal excretion of contrast is noted on delayed images. The bladder is well distended. Stomach/Bowel: Diverticular change of the colon is noted without evidence of diverticulitis. No obstructive or inflammatory changes are seen. The appendix is air-filled and within normal limits. No small bowel abnormality is noted. Stomach is unremarkable with the exception of a small sliding-type hiatal hernia. Vascular/Lymphatic: Aortic atherosclerosis. No enlarged abdominal or pelvic lymph nodes.  Reproductive: Uterus and bilateral adnexa are unremarkable. Other: No abdominal wall hernia or abnormality. No abdominopelvic ascites. Musculoskeletal: Chronic anterolisthesis of L5 on S1 is seen. Disc space narrowing at L4-5 is noted. IMPRESSION: Tiny nonobstructing right lower pole renal stone. Diverticulosis without diverticulitis. No acute abnormality noted. Electronically Signed   By: Alcide Clever M.D.   On: 01/22/2019 21:35    EKG: Independently reviewed. Sinus rhythm Probable left atrial enlargement Low voltage, precordial leads RSR' in V1 or V2, probably normal variant Borderline T abnormalities, anterior leads Vent. rate 83 BPM PR interval * ms QRS duration 80 ms QT/QTc 351/413 ms P-R-T axes 64 80 22  Assessment/Plan Principal Problem:   Hypokalemia Observation/telemetry. Continue IV replacement with KCl. Magnesium was supplemented. Follow-up potassium level.  Active Problems:   Abdominal pain Analgesics as needed. Antiemetic as needed. Given low-grade fever, diverticulitis is suspected.. Empiric Cipro and Flagyl IVPB.    Hyperbilirubinemia  Likely volume depletion. Repeat level in a.m. Consider further work-up depending on results.    Volume depletion Continue gentle IV hydration.    Normocytic anemia Check anemia panel. Monitor H&H.     Generalized weakness Continue treatment as above.   DVT prophylaxis: Lovenox SQ. Code Status: Full code Family Communication: Disposition Plan: Observation for electrolyte replacement, empiric antibiotic treatment. Consults called: Admission status: Observation/telemetry.   Bobette Mo MD [facility]  01/22/2019, 11:06 PM   This document was prepared using Dragon voice recognition software and may contain some unintended transcription errors.

## 2019-01-22 NOTE — ED Triage Notes (Signed)
Pt states that she has been having lower abd pain for the past 2 weeks before today she would have a bm or urinate and would get a day of relief but today she did not get that releif.

## 2019-01-23 ENCOUNTER — Observation Stay (HOSPITAL_COMMUNITY): Payer: 59

## 2019-01-23 ENCOUNTER — Encounter (HOSPITAL_COMMUNITY): Payer: Self-pay | Admitting: Internal Medicine

## 2019-01-23 DIAGNOSIS — D649 Anemia, unspecified: Secondary | ICD-10-CM | POA: Diagnosis present

## 2019-01-23 DIAGNOSIS — G9341 Metabolic encephalopathy: Secondary | ICD-10-CM | POA: Diagnosis not present

## 2019-01-23 LAB — CBC
HCT: 32.7 % — ABNORMAL LOW (ref 36.0–46.0)
Hemoglobin: 10.5 g/dL — ABNORMAL LOW (ref 12.0–15.0)
MCH: 31.8 pg (ref 26.0–34.0)
MCHC: 32.1 g/dL (ref 30.0–36.0)
MCV: 99.1 fL (ref 80.0–100.0)
Platelets: 241 10*3/uL (ref 150–400)
RBC: 3.3 MIL/uL — ABNORMAL LOW (ref 3.87–5.11)
RDW: 12.6 % (ref 11.5–15.5)
WBC: 10.2 10*3/uL (ref 4.0–10.5)
nRBC: 0 % (ref 0.0–0.2)

## 2019-01-23 LAB — COMPREHENSIVE METABOLIC PANEL
ALT: 12 U/L (ref 0–44)
AST: 18 U/L (ref 15–41)
Albumin: 3 g/dL — ABNORMAL LOW (ref 3.5–5.0)
Alkaline Phosphatase: 61 U/L (ref 38–126)
Anion gap: 8 (ref 5–15)
BUN: 9 mg/dL (ref 8–23)
CO2: 26 mmol/L (ref 22–32)
Calcium: 8.8 mg/dL — ABNORMAL LOW (ref 8.9–10.3)
Chloride: 105 mmol/L (ref 98–111)
Creatinine, Ser: 0.66 mg/dL (ref 0.44–1.00)
GFR calc Af Amer: >60 mL/min (ref >60)
GFR calc non Af Amer: >60 mL/min (ref >60)
Glucose, Bld: 112 mg/dL — ABNORMAL HIGH (ref 70–99)
Potassium: 3.3 mmol/L — ABNORMAL LOW (ref 3.5–5.1)
Sodium: 139 mmol/L (ref 135–145)
Total Bilirubin: 1.4 mg/dL — ABNORMAL HIGH (ref 0.3–1.2)
Total Protein: 6.8 g/dL (ref 6.5–8.1)

## 2019-01-23 LAB — RETICULOCYTES
Immature Retic Fract: 14.6 % (ref 2.3–15.9)
RBC.: 3.3 MIL/uL — ABNORMAL LOW (ref 3.87–5.11)
Retic Count, Absolute: 41.3 10*3/uL (ref 19.0–186.0)
Retic Ct Pct: 1.3 % (ref 0.4–3.1)

## 2019-01-23 LAB — GLUCOSE, CAPILLARY: Glucose-Capillary: 103 mg/dL — ABNORMAL HIGH (ref 70–99)

## 2019-01-23 LAB — IRON AND TIBC
Iron: 10 ug/dL — ABNORMAL LOW (ref 28–170)
Saturation Ratios: 4 % — ABNORMAL LOW (ref 10.4–31.8)
TIBC: 267 ug/dL (ref 250–450)
UIBC: 257 ug/dL

## 2019-01-23 LAB — FOLATE: Folate: 6.2 ng/mL (ref >5.9)

## 2019-01-23 LAB — VITAMIN B12: Vitamin B-12: 189 pg/mL (ref 180–914)

## 2019-01-23 LAB — FERRITIN: Ferritin: 214 ng/mL (ref 11–307)

## 2019-01-23 LAB — CK: Total CK: 357 U/L — ABNORMAL HIGH (ref 38–234)

## 2019-01-23 MED ORDER — POTASSIUM CHLORIDE ER 20 MEQ PO TBCR: 20.0000 meq | EXTENDED_RELEASE_TABLET | Freq: Every day | ORAL | 0 refills | Status: DC

## 2019-01-23 MED ORDER — METRONIDAZOLE IN NACL 5-0.79 MG/ML-% IV SOLN
500.0000 mg | Freq: Three times a day (TID) | INTRAVENOUS | Status: DC
  Administered 2019-01-23 – 2019-01-24 (×5): 500 mg via INTRAVENOUS
  Filled 2019-01-23 (×5): qty 100

## 2019-01-23 MED ORDER — PROCHLORPERAZINE EDISYLATE 10 MG/2ML IJ SOLN: 5.0000 mg | INTRAMUSCULAR | Status: DC | PRN

## 2019-01-23 MED ORDER — POTASSIUM CHLORIDE CRYS ER 20 MEQ PO TBCR
40.0000 meq | EXTENDED_RELEASE_TABLET | Freq: Once | ORAL | Status: AC
  Administered 2019-01-23: 40 meq via ORAL
  Filled 2019-01-23: qty 2

## 2019-01-23 MED ORDER — MORPHINE SULFATE (PF) 2 MG/ML IV SOLN
2.0000 mg | INTRAVENOUS | Status: DC | PRN
  Administered 2019-01-23 – 2019-01-25 (×6): 2 mg via INTRAVENOUS
  Filled 2019-01-23 (×6): qty 1

## 2019-01-23 MED ORDER — ACETAMINOPHEN 325 MG PO TABS: 650.0000 mg | ORAL_TABLET | Freq: Four times a day (QID) | ORAL | 0 refills | Status: DC | PRN

## 2019-01-23 MED ORDER — ONDANSETRON HCL 4 MG PO TABS: 4.0000 mg | ORAL_TABLET | Freq: Three times a day (TID) | ORAL | 1 refills | Status: DC | PRN

## 2019-01-23 MED ORDER — SENNOSIDES-DOCUSATE SODIUM 8.6-50 MG PO TABS: 2.0000 | ORAL_TABLET | Freq: Every day | ORAL | 1 refills | Status: DC

## 2019-01-23 MED ORDER — CIPROFLOXACIN IN D5W 400 MG/200ML IV SOLN
400.0000 mg | Freq: Two times a day (BID) | INTRAVENOUS | Status: DC
  Administered 2019-01-23 – 2019-01-24 (×3): 400 mg via INTRAVENOUS
  Filled 2019-01-23 (×3): qty 200

## 2019-01-23 NOTE — Progress Notes (Signed)
Patient Demographics:    Wendy Mccann, is a 73 y.o. female, DOB - [birth year], EHU:[id number]  Admit date - 01/22/2019   Admitting Physician Bobette Mo, MD  Outpatient Primary MD for the patient is [facility]  LOS - 0   Chief Complaint  Patient presents with  . Abdominal Pain        Subjective:    Alila Rushworth today has no fevers, no emesis,  No chest pain, patient with generalized weakness, unsteady gait and episodes of confusion  Assessment  & Plan :    Principal Problem:   Hypokalemia Active Problems:   Generalized weakness   Abdominal pain   Volume depletion   Hyperbilirubinemia   Normocytic anemia  Brief summary 73 year old without significant underlying chronic medical problems admitted on 01/22/2019 with generalized weakness and abdominal pain and found to have electrolyte abnormalities, postop colonization patient was noted to have significant weakness gait problems and high fall risk, CKs were mildly elevated on admission, patient also found to have intermittent episodes of confusion or disorientation    A/p 1) metabolic encephalopathy--- patient with intermittent episodes of confusion disorientation in the setting of electrolyte abnormalities however she also has significant weakness and gait problems get CT head to rule out intracranial pathology.  LFTs are not elevated except for bilirubin.   2)FEN/Hypokalemia--- replace and recheck, etiology unclear, magnesium and phosphorus were normal  3)Abdominal pain---- mostly resolved at this time, CT abdomen and pelvis on admission on 01/22/2019 without acute findings, patient previously had a CT abdomen and pelvis as well on 12/23/2018 that was also negative for acute findings , low clinical index of suspicion for diverticulitis may discontinue antibiotics after today... Patient apparently had low-grade fevers on  admission currently afebrile   4)Hyperbilirubinemia--- CT abdomen and pelvis on admission on 01/22/2019 without acute findings, patient previously had a CT abdomen and pelvis as well on 12/23/2018 that was also negative for acute findings.  Recheck bili  5)Mild Normocytic anemia--- folic acid and B12 are not low, low serum iron noted, low iron saturation noted, ferritin is not low, hemoglobin stable above 11, anticipate some drop in H&H with hydration... No indication for further inpatient work-up at this time  6)Generalized weakness--Physical therapy eval appreciated, CT head pending   7)Disposition--- patient lives alone, admitted with generalized weakness elevated CPK unsteady gait low-grade fevers, fall risk and electrolyte abnormalities----physical therapy recommends SNF rehab.... Unsafe to discharge home at this time... Given confusional episodes and gait issues very high risk for self injury  Disposition/Need for in-Hospital Stay- patient unable to be discharged at this time due to awaiting SNF rehab approval  Code Status : Full  Family Communication:   Left Message sister Ms Rayel Schirtzinger [phone]  Disposition Plan  : SNF Rehab  Consults  :  Physical Therapy  DVT Prophylaxis  :  Lovenox -  SCDs   Lab Results  Component Value Date   PLT 241 01/23/2019    Inpatient Medications  Scheduled Meds: . enoxaparin (LOVENOX) injection  40 mg Subcutaneous Q24H  . potassium chloride  40 mEq Oral Once   Continuous Infusions: . ciprofloxacin 400 mg (01/23/19 1642)  . metronidazole 500 mg (01/23/19 1133)   PRN Meds:.acetaminophen **OR** acetaminophen, morphine injection,  prochlorperazine    Anti-infectives (From admission, onward)   Start     Dose/Rate Route Frequency Ordered Stop   01/23/19 0300  ciprofloxacin (CIPRO) IVPB 400 mg     400 mg 200 mL/hr over 60 Minutes Intravenous Every 12 hours 01/23/19 0256     01/23/19 0300  metroNIDAZOLE (FLAGYL) IVPB 500 mg     500 mg  100 mL/hr over 60 Minutes Intravenous Every 8 hours 01/23/19 0256          Objective:   Vitals:   01/22/19 2326 01/23/19 0014 01/23/19 0515 01/23/19 1303  BP: 130/66 114/60 103/74 (!) 104/56  Pulse: 88 84 64 71  Resp: (!) 22 19 18 18   Temp: 100.3 F (37.9 C) 98.5 F (36.9 C) (!) 97.5 F (36.4 C)   TempSrc: Oral Oral Oral   SpO2: 99% 97% 96% 100%  Weight:      Height:        Wt Readings from Last 3 Encounters:  01/22/19 62.8 kg  12/23/18 63 kg  05/19/18 54.4 kg     Intake/Output Summary (Last 24 hours) at 01/23/2019 1715 Last data filed at 01/23/2019 1300 Gross per 24 hour  Intake 1364.72 ml  Output 500 ml  Net 864.72 ml     Physical Exam  Gen:- Awake Alert, in no apparent distress  HEENT:- [email or web address].AT, No sclera icterus Neck-Supple Neck,No JVD,.  Lungs-  CTAB , fair symmetrical air movement CV- S1, S2 normal, regular  Abd-  +ve B.Sounds, Abd Soft, No tenderness,    Extremity/Skin:- No  edema, pedal pulses present  Psych-affect is appropriate, oriented x3 Neuro-generalized weakness, unsteady gait , no tremors   Data Review:   Micro Results Recent Results (from the past 240 hour(s))  SARS Coronavirus 2 (CEPHEID- Performed in [facility] hospital lab), Hosp Order     Status: None   Collection Time: 01/22/19 10:21 PM   Specimen: Nasopharyngeal Swab  Result Value Ref Range Status   SARS Coronavirus 2 NEGATIVE NEGATIVE Final    Comment: (NOTE) If result is NEGATIVE SARS-CoV-2 target nucleic acids are NOT DETECTED. The SARS-CoV-2 RNA is generally detectable in upper and lower  respiratory specimens during the acute phase of infection. The lowest  concentration of SARS-CoV-2 viral copies this assay can detect is 250  copies / mL. A negative result does not preclude SARS-CoV-2 infection  and should not be used as the sole basis for treatment or other  patient management decisions.  A negative result may occur with  improper specimen collection / handling, submission  of specimen other  than nasopharyngeal swab, presence of viral mutation(s) within the  areas targeted by this assay, and inadequate number of viral copies  (<250 copies / mL). A negative result must be combined with clinical  observations, patient history, and epidemiological information. If result is POSITIVE SARS-CoV-2 target nucleic acids are DETECTED. The SARS-CoV-2 RNA is generally detectable in upper and lower  respiratory specimens dur ing the acute phase of infection.  Positive  results are indicative of active infection with SARS-CoV-2.  Clinical  correlation with patient history and other diagnostic information is  necessary to determine patient infection status.  Positive results do  not rule out bacterial infection or co-infection with other viruses. If result is PRESUMPTIVE POSTIVE SARS-CoV-2 nucleic acids MAY BE PRESENT.   A presumptive positive result was obtained on the submitted specimen  and confirmed on repeat testing.  While 2019 novel coronavirus  (SARS-CoV-2) nucleic acids may be present in  the submitted sample  additional confirmatory testing may be necessary for epidemiological  and / or clinical management purposes  to differentiate between  SARS-CoV-2 and other Sarbecovirus currently known to infect humans.  If clinically indicated additional testing with an alternate test  methodology [phone]) is advised. The SARS-CoV-2 RNA is generally  detectable in upper and lower respiratory sp ecimens during the acute  phase of infection. The expected result is Negative. Fact Sheet for Patients:  BoilerBrush.com.cy Fact Sheet for Healthcare Providers: https://pope.com/ This test is not yet approved or cleared by the [place] FDA and has been authorized for detection and/or diagnosis of SARS-CoV-2 by FDA under an Emergency Use Authorization (EUA).  This EUA will remain in effect (meaning this test can be used) for  the duration of the COVID-19 declaration under Section 564(b)(1) of the Act, 21 U.S.C. section 360bbb-3(b)(1), unless the authorization is terminated or revoked sooner. Performed at [facility], [address], [place], [place] [address]     Radiology Reports Ct Abdomen Pelvis W Contrast  Result Date: 01/22/2019 CLINICAL DATA:  Lower abdominal pain for 2 weeks EXAM: CT ABDOMEN AND PELVIS WITH CONTRAST TECHNIQUE: Multidetector CT imaging of the abdomen and pelvis was performed using the standard protocol following bolus administration of intravenous contrast. CONTRAST:  OMNIPAQUE IOHEXOL 300 MG/ML  SOLN COMPARISON:  12/23/2018 FINDINGS: Lower chest: Lung bases are well aerated with some minimal scarring bilaterally. Hepatobiliary: No focal liver abnormality is seen. No gallstones, gallbladder wall thickening, or biliary dilatation. Pancreas: Unremarkable. No pancreatic ductal dilatation or surrounding inflammatory changes. Spleen: Normal in size without focal abnormality. Adrenals/Urinary Tract: Adrenal glands are within normal limits. Kidneys demonstrate a normal enhancement pattern bilaterally. Tiny nonobstructing right lower pole renal stone is noted. Normal excretion of contrast is noted on delayed images. The bladder is well distended. Stomach/Bowel: Diverticular change of the colon is noted without evidence of diverticulitis. No obstructive or inflammatory changes are seen. The appendix is air-filled and within normal limits. No small bowel abnormality is noted. Stomach is unremarkable with the exception of a small sliding-type hiatal hernia. Vascular/Lymphatic: Aortic atherosclerosis. No enlarged abdominal or pelvic lymph nodes. Reproductive: Uterus and bilateral adnexa are unremarkable. Other: No abdominal wall hernia or abnormality. No abdominopelvic ascites. Musculoskeletal: Chronic anterolisthesis of L5 on S1 is seen. Disc space narrowing at L4-5 is noted. IMPRESSION: Tiny nonobstructing  right lower pole renal stone. Diverticulosis without diverticulitis. No acute abnormality noted. Electronically Signed   By: Alcide Clever M.D.   On: 01/22/2019 21:35     CBC Recent Labs  Lab 01/22/19 1922 01/23/19 0504  WBC 8.9 10.2  HGB 11.7* 10.5*  HCT 36.0 32.7*  PLT 261 241  MCV 96.8 99.1  MCH 31.5 31.8  MCHC 32.5 32.1  RDW 12.4 12.6  LYMPHSABS 1.0  --   MONOABS 1.4*  --   EOSABS 0.0  --   BASOSABS 0.0  --     Chemistries  Recent Labs  Lab 01/22/19 1922 01/23/19 0504  NA 138 139  K 2.6* 3.3*  CL 103 105  CO2 26 26  GLUCOSE 127* 112*  BUN 13 9  CREATININE 0.94 0.66  CALCIUM 9.3 8.8*  MG 2.3  --   AST 22 18  ALT 14 12  ALKPHOS 72 61  BILITOT 1.8* 1.4*   ------------------------------------------------------------------------------------------------------------------ No results for input(s): CHOL, HDL, LDLCALC, TRIG, CHOLHDL, LDLDIRECT in the last 72 hours.  No results found for: HGBA1C ------------------------------------------------------------------------------------------------------------------ No results for input(s): TSH, T4TOTAL, T3FREE,  THYROIDAB in the last 72 hours.  Invalid input(s): FREET3 ------------------------------------------------------------------------------------------------------------------ Recent Labs    01/23/19 0504  VITAMINB12 189  FOLATE 6.2  FERRITIN 214  TIBC 267  IRON 10*  RETICCTPCT 1.3    Coagulation profile No results for input(s): INR, PROTIME in the last 168 hours.  No results for input(s): DDIMER in the last 72 hours.  Cardiac Enzymes No results for input(s): CKMB, TROPONINI, MYOGLOBIN in the last 168 hours.  Invalid input(s): CK ------------------------------------------------------------------------------------------------------------------ No results found for: BNP   Roxan Hockey M.D on 01/23/2019 at 5:15 PM  Go to [email or web address] - for contact info  [facility] - Office  [phone]

## 2019-01-23 NOTE — Plan of Care (Signed)

## 2019-01-23 NOTE — TOC Initial Note (Signed)
Transition of Care [facility]) - Initial/Assessment Note    Patient Details  Name: Wendy Mccann MRN: [id number] Date of Birth: 1946-02-04  Transition of Care [facility]) CM/SW Contact:    Shade Flood, LCSW Phone Number: 01/23/2019, 3:03 PM  Clinical Narrative:                  Pt admitted from home. PT recommending SNF rehab. Met with pt to discuss. Pt appears disoriented which is not her baseline. Pt verbalizing that she wants to go home because she needs to check on her house. Ultimately she did agree to referral to [facility]. Updated MD who states he will not dc pt today. Pt will need insurance authorization for SNF.   TOC will follow.  Expected Discharge Plan: Skilled Nursing Facility Barriers to Discharge: Continued Medical Work up   Patient Goals and CMS Choice Patient states their goals for this hospitalization and ongoing recovery are:: Return home CMS Medicare.gov Compare Post Acute Care list provided to:: Patient Choice offered to / list presented to : Patient  Expected Discharge Plan and Services Expected Discharge Plan: [place] In-house Referral: Clinical Social Work     Living arrangements for the past 2 months: Single Family Home Expected Discharge Date: 01/23/19                                    Prior Living Arrangements/Services Living arrangements for the past 2 months: Single Family Home Lives with:: Self Patient language and need for interpreter reviewed:: Yes Do you feel safe going back to the place where you live?: Yes      Need for Family Participation in Patient Care: No (Comment) Care giver support system in place?: Yes (comment)   Criminal Activity/Legal Involvement Pertinent to Current Situation/Hospitalization: No - Comment as needed  Activities of Daily Living Home Assistive Devices/Equipment: None ADL Screening (condition at time of admission) Patient's cognitive ability adequate to safely complete daily activities?: Yes Is  the patient deaf or have difficulty hearing?: No Does the patient have difficulty seeing, even when wearing glasses/contacts?: No Does the patient have difficulty concentrating, remembering, or making decisions?: No Patient able to express need for assistance with ADLs?: Yes Does the patient have difficulty dressing or bathing?: No Independently performs ADLs?: Yes (appropriate for developmental age) Does the patient have difficulty walking or climbing stairs?: Yes Weakness of Legs: Both Weakness of Arms/Hands: None  Permission Sought/Granted Permission sought to share information with : Chartered certified accountant granted to share information with : Yes, Verbal Permission Granted     Permission granted to share info w AGENCY: [facility]        Emotional Assessment Appearance:: Appears stated age Attitude/Demeanor/Rapport: Engaged Affect (typically observed): Pleasant Orientation: : Oriented to Self Alcohol / Substance Use: Not Applicable Psych Involvement: No (comment)  Admission diagnosis:  Abdomial Pain Patient Active Problem List   Diagnosis Date Noted  . Normocytic anemia 01/23/2019  . Hypokalemia 01/22/2019  . Volume depletion 01/22/2019  . Hyperbilirubinemia 01/22/2019  . Generalized weakness 08/12/2016  . Orthostatic hypotension 08/12/2016  . Abdominal pain 08/12/2016  . Cough 08/12/2016  . Dehydration, mild 08/12/2016   PCP:  [place], [place]:   [place], [address] [place] [address] Phone: [phone] Fax: [place], [place] [address]  [address] [place] [address] Phone: [phone] Fax: [phone]     Social Determinants of Health (SDOH) Interventions    Readmission Risk Interventions No flowsheet data found.

## 2019-01-23 NOTE — Plan of Care (Signed)
  Problem: Acute Rehab PT Goals(only PT should resolve) Goal: Pt Will Go Supine/Side To Sit Outcome: Progressing Flowsheets (Taken 01/23/2019 1445) Pt will go Supine/Side to Sit: with modified independence Goal: Patient Will Transfer Sit To/From Stand Outcome: Progressing Flowsheets (Taken 01/23/2019 1445) Patient will transfer sit to/from stand: with min guard assist Goal: Pt Will Transfer Bed To Chair/Chair To Bed Outcome: Progressing Flowsheets (Taken 01/23/2019 1445) Pt will Transfer Bed to Chair/Chair to Bed: min guard assist Goal: Pt Will Ambulate Outcome: Progressing Flowsheets (Taken 01/23/2019 1445) Pt will Ambulate:  50 feet  with min guard assist  with rolling walker   2:46 PM, 01/23/19 Lonell Grandchild, MPT Physical Therapist with [facility] [phone] office [phone] mobile phone

## 2019-01-23 NOTE — Progress Notes (Signed)
Patient moved to room 341 to be closer to nurses station for patient safety. Patient's sister, Dynasty Holquin, updated.

## 2019-01-23 NOTE — NC FL2 (Signed)
  [place] LEVEL OF CARE SCREENING TOOL     IDENTIFICATION  Patient Name: Wendy Mccann Birthdate: 1945/12/25 Sex: female Admission Date (Current Location): 01/22/2019  [facility] and [place] Number:  [employer] and Address:  [place] [address] [place]      Provider Number: [id number]  Attending Physician Name and Address:  Roxan Hockey, MD  Relative Name and Phone Number:       Current Level of Care: Hospital Recommended Level of Care: [place] Prior Approval Number:    Date Approved/Denied:   PASRR Number: [id number]  Discharge Plan: SNF    Current Diagnoses: Patient Active Problem List   Diagnosis Date Noted  . Normocytic anemia 01/23/2019  . Hypokalemia 01/22/2019  . Volume depletion 01/22/2019  . Hyperbilirubinemia 01/22/2019  . Generalized weakness 08/12/2016  . Orthostatic hypotension 08/12/2016  . Abdominal pain 08/12/2016  . Cough 08/12/2016  . Dehydration, mild 08/12/2016    Orientation RESPIRATION BLADDER Height & Weight     Self, Place  Normal Continent Weight: 138 lb 6.4 oz (62.8 kg) Height:  5\' 1"  (154.9 cm)  BEHAVIORAL SYMPTOMS/MOOD NEUROLOGICAL BOWEL NUTRITION STATUS      Continent Diet(see dc summary)  AMBULATORY STATUS COMMUNICATION OF NEEDS Skin   Extensive Assist Verbally Normal                       Personal Care Assistance Level of Assistance  Bathing, Feeding, Dressing Bathing Assistance: Limited assistance Feeding assistance: Independent Dressing Assistance: Limited assistance     Functional Limitations Info  Sight, Hearing, Speech Sight Info: Adequate Hearing Info: Adequate Speech Info: Adequate    SPECIAL CARE FACTORS FREQUENCY  PT (By licensed PT), OT (By licensed OT)     PT Frequency: 5 times week OT Frequency: 3 times week            Contractures Contractures Info: Not present    Additional Factors Info  Code Status,  Allergies Code Status Info: Full Allergies Info: NKA           Current Medications (01/23/2019):  This is the current hospital active medication list Current Facility-Administered Medications  Medication Dose Route Frequency Provider Last Rate Last Dose  . acetaminophen (TYLENOL) tablet 650 mg  650 mg Oral Q6H PRN Reubin Milan, MD   650 mg at 01/23/19 1239   Or  . acetaminophen (TYLENOL) suppository 650 mg  650 mg Rectal Q6H PRN Reubin Milan, MD      . ciprofloxacin (CIPRO) IVPB 400 mg  400 mg Intravenous Q12H Reubin Milan, MD 200 mL/hr at 01/23/19 0310 400 mg at 01/23/19 0310  . enoxaparin (LOVENOX) injection 40 mg  40 mg Subcutaneous Q24H Reubin Milan, MD   40 mg at 01/23/19 0041  . metroNIDAZOLE (FLAGYL) IVPB 500 mg  500 mg Intravenous Q8H Reubin Milan, MD 100 mL/hr at 01/23/19 1133 500 mg at 01/23/19 1133  . morphine 2 MG/ML injection 2 mg  2 mg Intravenous Q2H PRN Reubin Milan, MD      . prochlorperazine (COMPAZINE) injection 5 mg  5 mg Intravenous Q4H PRN Reubin Milan, MD         Discharge Medications: Please see discharge summary for a list of discharge medications.  Relevant Imaging Results:  Relevant Lab Results:   Additional Information SSN: [id number]  Shade Flood, LCSW

## 2019-01-23 NOTE — Evaluation (Signed)
Physical Therapy Evaluation Patient Details Name: Wendy Mccann MRN: [id number] DOB: 1945-12-18 Today's Date: 01/23/2019   History of Present Illness  Wendy Mccann is a 73 y.o. female with medical history significant of rotator cuff capsule sprain who is coming to the emergency department due to a 2-week history of lower abdominal pain that usually gets relieved with a bowel movement or urination for a day or so, but today the pain persisted and worsened.  She feels fatigued.  Her stools are soft.  She denies nausea, emesis, constipation, melena or hematochezia.  No dysuria, frequency or hematuria.  She denies fever, chills, sore throat, rhinorrhea, dyspnea, wheezing or hemoptysis.  No chest pain, palpitations, diaphoresis, dizziness, PND, orthopnea or lower extremity edema.  No polyuria, polydipsia, polyphagia or blurred vision.    Clinical Impression  Patient functioning well below baseline, at high risk for falls and limited for functional mobility and gait as stated below secondary to BLE weakness, ankle pain,fatigue and poor standing balance.  Patient requires much time and labored movement to complete sit to stands, transfers and has to use RW for taking steps due to poor standing balance/fatgiue. Patient tolerated staying up in chair after therapy.  Patient will benefit from continued physical therapy in hospital and recommended venue below to increase strength, balance, endurance for safe ADLs and gait.    Follow Up Recommendations SNF;Supervision for mobility/OOB;Supervision/Assistance - 24 hour    Equipment Recommendations  Rolling walker with 5" wheels    Recommendations for Other Services       Precautions / Restrictions Precautions Precautions: Fall Restrictions Weight Bearing Restrictions: No      Mobility  Bed Mobility Overal bed mobility: Needs Assistance Bed Mobility: Supine to Sit;Sit to Supine     Supine to sit: Min guard Sit to supine: Min guard    General bed mobility comments: slow labored movement, has to use bed rail to pull herself to sitting  Transfers Overall transfer level: Needs assistance Equipment used: None;Rolling walker (2 wheeled);1 person hand held assist Transfers: Sit to/from [employer] Sit to Stand: Min assist;Mod assist Stand pivot transfers: Min assist;Mod assist       General transfer comment: very unsteady and unable to transfer without hands on assist or using RW with assistance  Ambulation/Gait Ambulation/Gait assistance: Min assist;Mod assist Gait Distance (Feet): 10 Feet Assistive device: Rolling walker (2 wheeled) Gait Pattern/deviations: Decreased step length - right;Decreased step length - left;Decreased stride length Gait velocity: slow   General Gait Details: very unsteady on feet and unable to take steps without hand held assistance or using RW due to BLE weakness and ankle pain  Stairs            Wheelchair Mobility    Modified Rankin (Stroke Patients Only)       Balance Overall balance assessment: Needs assistance Sitting-balance support: Feet supported;No upper extremity supported Sitting balance-Leahy Scale: Fair Sitting balance - Comments: fair/good seated at bedside   Standing balance support: During functional activity;No upper extremity supported Standing balance-Leahy Scale: Poor Standing balance comment: fair using RW                             Pertinent Vitals/Pain Pain Assessment: 0-10 Pain Score: 7  Pain Location: bilateral ankles Pain Descriptors / Indicators: Numbness;Sore Pain Intervention(s): Limited activity within patient's tolerance;Monitored during session    Home Living Family/patient expects to be discharged to:: Private residence Living Arrangements: Alone Available  Help at Discharge: Family;Available 24 hours/day Type of Home: House Home Access: Stairs to enter Entrance Stairs-Rails: Left Entrance Stairs-Number  of Steps: 2 Home Layout: One level Home Equipment: Shower seat      Prior Function Level of Independence: Independent         Comments: Hydrographic surveyor, does not drive, 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     Hand Dominance        Extremity/Trunk Assessment   Upper Extremity Assessment Upper Extremity Assessment: Generalized weakness    Lower Extremity Assessment Lower Extremity Assessment: Generalized weakness    Cervical / Trunk Assessment Cervical / Trunk Assessment: Normal  Communication   Communication: No difficulties  Cognition Arousal/Alertness: Awake/alert Behavior During Therapy: WFL for tasks assessed/performed Overall Cognitive Status: Within Functional Limits for tasks assessed                                        General Comments      Exercises     Assessment/Plan    PT Assessment Patient needs continued PT services  PT Problem List Decreased strength;Decreased activity tolerance;Decreased balance;Decreased mobility       PT Treatment Interventions Balance training;Gait training;Stair training;Functional mobility training;Therapeutic activities;Therapeutic exercise;Patient/family education    PT Goals (Current goals can be found in the Care Plan section)  Acute Rehab PT Goals Patient Stated Goal: return home with family to assist PT Goal Formulation: With patient Time For Goal Achievement: 02/06/19 Potential to Achieve Goals: Good    Frequency Min 3X/week   Barriers to discharge        Co-evaluation               AM-PAC PT "6 Clicks" Mobility  Outcome Measure Help needed turning from your back to your side while in a flat bed without using bedrails?: None Help needed moving from lying on your back to sitting on the side of a flat bed without using bedrails?: A Little Help needed moving to and from a bed to a chair (including a wheelchair)?: A Lot Help needed standing up from a chair using your arms (e.g.,  wheelchair or bedside chair)?: A Lot Help needed to walk in hospital room?: A Lot Help needed climbing 3-5 steps with a railing? : A Lot 6 Click Score: 15    End of Session   Activity Tolerance: Patient tolerated treatment well;Patient limited by fatigue Patient left: in chair;with call bell/phone within reach Nurse Communication: Mobility status PT Visit Diagnosis: Unsteadiness on feet (R26.81);Other abnormalities of gait and mobility (R26.89);Muscle weakness (generalized) (M62.81)    Time: 5732-2025 PT Time Calculation (min) (ACUTE ONLY): 29 min   Charges:   PT Evaluation $PT Eval Moderate Complexity: 1 Mod PT Treatments $Therapeutic Activity: 23-37 mins        2:44 PM, 01/23/19 Lonell Grandchild, MPT Physical Therapist with [facility] [phone] office [phone] mobile phone

## 2019-01-24 MED ORDER — DONEPEZIL HCL 5 MG PO TABS
5.0000 mg | ORAL_TABLET | Freq: Every day | ORAL | Status: DC
  Administered 2019-01-24 – 2019-01-26 (×3): 5 mg via ORAL
  Filled 2019-01-24 (×3): qty 1

## 2019-01-24 MED ORDER — LORAZEPAM 2 MG/ML IJ SOLN
0.5000 mg | Freq: Two times a day (BID) | INTRAMUSCULAR | Status: DC | PRN
  Administered 2019-01-25 – 2019-01-26 (×2): 0.5 mg via INTRAVENOUS
  Filled 2019-01-24 (×2): qty 1

## 2019-01-24 MED ORDER — POTASSIUM CHLORIDE CRYS ER 20 MEQ PO TBCR
40.0000 meq | EXTENDED_RELEASE_TABLET | Freq: Once | ORAL | Status: AC
  Administered 2019-01-24: 40 meq via ORAL
  Filled 2019-01-24: qty 2

## 2019-01-24 MED ORDER — QUETIAPINE FUMARATE 25 MG PO TABS
25.0000 mg | ORAL_TABLET | Freq: Every day | ORAL | Status: DC
  Administered 2019-01-24 – 2019-01-26 (×3): 25 mg via ORAL
  Filled 2019-01-24 (×3): qty 1

## 2019-01-24 NOTE — Progress Notes (Signed)
Patient Demographics:    Wendy Mccann, is a 73 y.o. female, DOB - [birth year], LOV:[id number]  Admit date - 01/22/2019   Admitting Physician Reubin Milan, MD  Outpatient Primary MD for the patient is [facility]  LOS - 0   Chief Complaint  Patient presents with   Abdominal Pain        Subjective:    Wendy Mccann today has no fevers, no emesis,  No chest pain, gait and cognitive deficits persist, no significant agitation  Assessment  & Plan :    Principal Problem:   Hypokalemia Active Problems:   Generalized weakness   Abdominal pain   Volume depletion   Hyperbilirubinemia   Normocytic anemia  Brief summary 73 year old without significant underlying chronic medical problems admitted on 01/22/2019 with generalized weakness and abdominal pain and found to have electrolyte abnormalities, postop colonization patient was noted to have significant weakness gait problems and high fall risk, CKs were mildly elevated on admission, patient also found to have intermittent episodes of confusion or disorientation --Patient with very poor insight, cognitive concerns,   A/p 1) metabolic encephalopathy--- patient with intermittent episodes of confusion disorientation in the setting of electrolyte abnormalities however she also has significant weakness and gait problems  CT head  without intracranial pathology.  LFTs are not elevated except for bilirubin.   2)FEN/Hypokalemia--- replace and recheck, etiology unclear, magnesium and phosphorus were normal  3)Abdominal pain---- mostly resolved at this time, CT abdomen and pelvis on admission on 01/22/2019 without acute findings, patient previously had a CT abdomen and pelvis as well on 12/23/2018 that was also negative for acute findings , low clinical index of suspicion for diverticulitis may discontinue antibiotics after today... Patient  apparently had low-grade fevers on admission currently afebrile   4)Hyperbilirubinemia--- CT abdomen and pelvis on admission on 01/22/2019 without acute findings, patient previously had a CT abdomen and pelvis as well on 12/23/2018 that was also negative for acute findings.  Recheck bili  5)Mild Normocytic anemia--- folic acid and O84 are not low, low serum iron noted, low iron saturation noted, ferritin is not low, hemoglobin stable above 11, anticipate some drop in H&H with hydration... No indication for further inpatient work-up at this time  6)Generalized weakness--Physical therapy eval appreciated, CT head pending   7)Disposition--- patient lives alone, admitted with generalized weakness elevated CPK unsteady gait low-grade fevers, fall risk and electrolyte abnormalities----physical therapy recommends SNF rehab.... Unsafe to discharge home at this time... Given confusional episodes and gait issues very high risk for self injury  8)Dementia--- as per patient's sister patient has been having cognitive decline over the last 4 years to the point where at work she was having difficulty making salads so she was told to quit cooking food and focus on cleaning the kitchen... She apparently has become increasingly more forgetful with memory deficits and cognitive deficits requiring more help with executive decision-making over the last 4 years particularly over the last 6 months --Check RPR and TSH, CT head without acute findings, start Aricept  Disposition/Need for in-Hospital Stay- patient unable to be discharged at this time due to awaiting SNF rehab approval  Code Status : Full  Family Communication:   Discussed with  sister [name] [phone]  Disposition Plan  : SNF Rehab  Consults  :  Physical Therapy  DVT Prophylaxis  :  Lovenox -  SCDs   Lab Results  Component Value Date   PLT 241 01/23/2019    Inpatient Medications  Scheduled Meds:  donepezil  5 mg Oral QHS     enoxaparin (LOVENOX) injection  40 mg Subcutaneous Q24H   QUEtiapine  25 mg Oral QHS   Continuous Infusions:  PRN Meds:.acetaminophen **OR** acetaminophen, LORazepam, morphine injection, prochlorperazine    Anti-infectives (From admission, onward)   Start     Dose/Rate Route Frequency Ordered Stop   01/23/19 0300  ciprofloxacin (CIPRO) IVPB 400 mg  Status:  Discontinued     400 mg 200 mL/hr over 60 Minutes Intravenous Every 12 hours 01/23/19 0256 01/24/19 1457   01/23/19 0300  metroNIDAZOLE (FLAGYL) IVPB 500 mg  Status:  Discontinued     500 mg 100 mL/hr over 60 Minutes Intravenous Every 8 hours 01/23/19 0256 01/24/19 1457        Objective:   Vitals:   01/23/19 1303 01/23/19 1927 01/23/19 2137 01/24/19 0517  BP: (!) 104/56  (!) 122/49 (!) 112/55  Pulse: 71  80 79  Resp: 18  15 18   Temp:   98.4 F (36.9 C) 98.4 F (36.9 C)  TempSrc:   Oral Oral  SpO2: 100% 97% 100% 100%  Weight:      Height:        Wt Readings from Last 3 Encounters:  01/22/19 62.8 kg  12/23/18 63 kg  05/19/18 54.4 kg     Intake/Output Summary (Last 24 hours) at 01/24/2019 1848 Last data filed at 01/24/2019 0620 Gross per 24 hour  Intake 240 ml  Output 700 ml  Net -460 ml     Physical Exam  Gen:- Awake Alert, in no apparent distress  HEENT:- [place].AT, No sclera icterus Neck-Supple Neck,No JVD,.  Lungs-  CTAB , fair symmetrical air movement CV- S1, S2 normal, regular  Abd-  +ve B.Sounds, Abd Soft, No tenderness,    Extremity/Skin:- No  edema, pedal pulses present  Psych-intermittent episodes of confusion and disorientation neuro-generalized weakness, unsteady gait , no tremors   Data Review:   Micro Results Recent Results (from the past 240 hour(s))  SARS Coronavirus 2 (CEPHEID- Performed in [facility] hospital lab), Hosp Order     Status: None   Collection Time: 01/22/19 10:21 PM   Specimen: Nasopharyngeal Swab  Result Value Ref Range Status   SARS Coronavirus 2 NEGATIVE NEGATIVE  Final    Comment: (NOTE) If result is NEGATIVE SARS-CoV-2 target nucleic acids are NOT DETECTED. The SARS-CoV-2 RNA is generally detectable in upper and lower  respiratory specimens during the acute phase of infection. The lowest  concentration of SARS-CoV-2 viral copies this assay can detect is 250  copies / mL. A negative result does not preclude SARS-CoV-2 infection  and should not be used as the sole basis for treatment or other  patient management decisions.  A negative result may occur with  improper specimen collection / handling, submission of specimen other  than nasopharyngeal swab, presence of viral mutation(s) within the  areas targeted by this assay, and inadequate number of viral copies  (<250 copies / mL). A negative result must be combined with clinical  observations, patient history, and epidemiological information. If result is POSITIVE SARS-CoV-2 target nucleic acids are DETECTED. The SARS-CoV-2 RNA is generally detectable in upper and lower  respiratory specimens dur ing the acute phase of  infection.  Positive  results are indicative of active infection with SARS-CoV-2.  Clinical  correlation with patient history and other diagnostic information is  necessary to determine patient infection status.  Positive results do  not rule out bacterial infection or co-infection with other viruses. If result is PRESUMPTIVE POSTIVE SARS-CoV-2 nucleic acids MAY BE PRESENT.   A presumptive positive result was obtained on the submitted specimen  and confirmed on repeat testing.  While 2019 novel coronavirus  (SARS-CoV-2) nucleic acids may be present in the submitted sample  additional confirmatory testing may be necessary for epidemiological  and / or clinical management purposes  to differentiate between  SARS-CoV-2 and other Sarbecovirus currently known to infect humans.  If clinically indicated additional testing with an alternate test  methodology [phone]([id number]) is advised. The  SARS-CoV-2 RNA is generally  detectable in upper and lower respiratory sp ecimens during the acute  phase of infection. The expected result is Negative. Fact Sheet for Patients:  BoilerBrush.com.cyhttps://www.fda.gov/media/136312/download Fact Sheet for Healthcare Providers: https://pope.com/https://www.fda.gov/media/136313/download This test is not yet approved or cleared by the [place] FDA and has been authorized for detection and/or diagnosis of SARS-CoV-2 by FDA under an Emergency Use Authorization (EUA).  This EUA will remain in effect (meaning this test can be used) for the duration of the COVID-19 declaration under Section 564(b)(1) of the Act, 21 U.S.C. section 360bbb-3(b)(1), unless the authorization is terminated or revoked sooner. Performed at [facility], [address] [place], [place] [address]     Radiology Reports Ct Head Wo Contrast  Result Date: 01/23/2019 CLINICAL DATA:  Metabolic encephalopathy. EXAM: CT HEAD WITHOUT CONTRAST TECHNIQUE: Contiguous axial images were obtained from the base of the skull through the vertex without intravenous contrast. COMPARISON:  None. FINDINGS: Brain: No evidence of acute infarction, hemorrhage, hydrocephalus, extra-axial collection or mass lesion/mass effect. Vascular: No hyperdense vessel or unexpected calcification. Skull: Normal. Negative for fracture or focal lesion. Sinuses/Orbits: No acute finding. Other: None. IMPRESSION: No acute intracranial abnormalities identified. Electronically Signed   By: Gerome Samavid  Williams III M.D   On: 01/23/2019 18:30   Ct Abdomen Pelvis W Contrast  Result Date: 01/22/2019 CLINICAL DATA:  Lower abdominal pain for 2 weeks EXAM: CT ABDOMEN AND PELVIS WITH CONTRAST TECHNIQUE: Multidetector CT imaging of the abdomen and pelvis was performed using the standard protocol following bolus administration of intravenous contrast. CONTRAST:  100mL OMNIPAQUE IOHEXOL 300 MG/ML  SOLN COMPARISON:  12/23/2018 FINDINGS: Lower chest: Lung bases are well  aerated with some minimal scarring bilaterally. Hepatobiliary: No focal liver abnormality is seen. No gallstones, gallbladder wall thickening, or biliary dilatation. Pancreas: Unremarkable. No pancreatic ductal dilatation or surrounding inflammatory changes. Spleen: Normal in size without focal abnormality. Adrenals/Urinary Tract: Adrenal glands are within normal limits. Kidneys demonstrate a normal enhancement pattern bilaterally. Tiny nonobstructing right lower pole renal stone is noted. Normal excretion of contrast is noted on delayed images. The bladder is well distended. Stomach/Bowel: Diverticular change of the colon is noted without evidence of diverticulitis. No obstructive or inflammatory changes are seen. The appendix is air-filled and within normal limits. No small bowel abnormality is noted. Stomach is unremarkable with the exception of a small sliding-type hiatal hernia. Vascular/Lymphatic: Aortic atherosclerosis. No enlarged abdominal or pelvic lymph nodes. Reproductive: Uterus and bilateral adnexa are unremarkable. Other: No abdominal wall hernia or abnormality. No abdominopelvic ascites. Musculoskeletal: Chronic anterolisthesis of L5 on S1 is seen. Disc space narrowing at L4-5 is noted. IMPRESSION: Tiny nonobstructing right lower pole renal stone. Diverticulosis without diverticulitis.  No acute abnormality noted. Electronically Signed   By: Alcide Clever M.D.   On: 01/22/2019 21:35     CBC Recent Labs  Lab 01/22/19 1922 01/23/19 0504  WBC 8.9 10.2  HGB 11.7* 10.5*  HCT 36.0 32.7*  PLT 261 241  MCV 96.8 99.1  MCH 31.5 31.8  MCHC 32.5 32.1  RDW 12.4 12.6  LYMPHSABS 1.0  --   MONOABS 1.4*  --   EOSABS 0.0  --   BASOSABS 0.0  --     Chemistries  Recent Labs  Lab 01/22/19 1922 01/23/19 0504  NA 138 139  K 2.6* 3.3*  CL 103 105  CO2 26 26  GLUCOSE 127* 112*  BUN 13 9  CREATININE 0.94 0.66  CALCIUM 9.3 8.8*  MG 2.3  --   AST 22 18  ALT 14 12  ALKPHOS 72 61  BILITOT 1.8*  1.4*   ------------------------------------------------------------------------------------------------------------------ No results for input(s): CHOL, HDL, LDLCALC, TRIG, CHOLHDL, LDLDIRECT in the last 72 hours.  No results found for: HGBA1C ------------------------------------------------------------------------------------------------------------------ No results for input(s): TSH, T4TOTAL, T3FREE, THYROIDAB in the last 72 hours.  Invalid input(s): FREET3 ------------------------------------------------------------------------------------------------------------------ Recent Labs    01/23/19 0504  VITAMINB12 189  FOLATE 6.2  FERRITIN 214  TIBC 267  IRON 10*  RETICCTPCT 1.3    Coagulation profile No results for input(s): INR, PROTIME in the last 168 hours.  No results for input(s): DDIMER in the last 72 hours.  Cardiac Enzymes No results for input(s): CKMB, TROPONINI, MYOGLOBIN in the last 168 hours.  Invalid input(s): CK ------------------------------------------------------------------------------------------------------------------ No results found for: BNP   Shon Hale M.D on 01/24/2019 at 6:48 PM  Go to [email or web address] - for contact info  [facility] - Office  [phone]

## 2019-01-24 NOTE — ED Provider Notes (Addendum)
[facility] MEDICAL SURGICAL UNIT Provider Note   CSN: [id number] Arrival date & time: 01/22/19  1832     History   Chief Complaint Chief Complaint  Patient presents with  . Abdominal Pain    HPI Wendy Mccann is a 73 y.o. female.     Patient complains of abdominal pain and weakness   The history is provided by the patient. No language interpreter was used.  Abdominal Pain Pain location:  Suprapubic Pain quality: aching   Pain radiates to:  Does not radiate Pain severity:  Mild Onset quality:  Sudden Timing:  Constant Progression:  Worsening Chronicity:  New Context: not alcohol use   Relieved by:  Nothing Worsened by:  Nothing Associated symptoms: no chest pain, no cough, no diarrhea, no fatigue and no hematuria     Past Medical History:  Diagnosis Date  . Medical history non-contributory   . Rotator cuff (capsule) sprain     Patient Active Problem List   Diagnosis Date Noted  . Normocytic anemia 01/23/2019  . Hypokalemia 01/22/2019  . Volume depletion 01/22/2019  . Hyperbilirubinemia 01/22/2019  . Generalized weakness 08/12/2016  . Orthostatic hypotension 08/12/2016  . Abdominal pain 08/12/2016  . Cough 08/12/2016  . Dehydration, mild 08/12/2016    Past Surgical History:  Procedure Laterality Date  . ankles       OB History    Gravida  0   Para  0   Term  0   Preterm  0   AB  0   Living  0     SAB  0   TAB  0   Ectopic  0   Multiple  0   Live Births  0            Home Medications    Prior to Admission medications   Medication Sig Start Date End Date Taking? Authorizing Provider  acetaminophen (TYLENOL) 500 MG tablet Take 1 tablet (500 mg total) by mouth every 6 (six) hours as needed. 05/19/18  Yes Law, Alexandra M, PA-C  magnesium citrate SOLN Drink one half of a bottle.  If you do not have a bowel movement in 4-6 hours then drink the other half of the bottle. 12/25/16  Yes Bethann Berkshire, MD  Phenyleph-CPM-DM-APAP  (ALKA-SELTZER PLUS COLD & COUGH) 11-14-08-325 MG CAPS Take 1 capsule by mouth once as needed.   Yes [provider]  simethicone (MYLICON) 80 MG chewable tablet Chew 1 tablet (80 mg total) by mouth every 6 (six) hours as needed for flatulence. 12/23/18  Yes Fawze, Mina A, PA-C  acetaminophen (TYLENOL) 325 MG tablet Take 2 tablets (650 mg total) by mouth every 6 (six) hours as needed for mild pain, fever or headache (or Fever >/= 101). 01/23/19   Shon Hale, MD  ondansetron (ZOFRAN) 4 MG tablet Take 1 tablet (4 mg total) by mouth every 8 (eight) hours as needed for nausea or vomiting. 01/23/19 01/23/20  Shon Hale, MD  Potassium Chloride ER 20 MEQ TBCR Take 20 mEq by mouth daily for 3 days. 1 tab daily by mouth 01/23/19 01/26/19  Emokpae, Courage, MD  senna-docusate (SENOKOT-S) 8.6-50 MG tablet Take 2 tablets by mouth at bedtime. 01/23/19 01/23/20  Shon Hale, MD    Family History Family History  Problem Relation Age of Onset  . COPD Father     Social History Social History   Tobacco Use  . Smoking status: Never Smoker  . Smokeless tobacco: Never Used  Substance Use  Topics  . Alcohol use: No  . Drug use: No     Allergies   Patient has no known allergies.   Review of Systems Review of Systems  Constitutional: Negative for appetite change and fatigue.  HENT: Negative for congestion, ear discharge and sinus pressure.   Eyes: Negative for discharge.  Respiratory: Negative for cough.   Cardiovascular: Negative for chest pain.  Gastrointestinal: Positive for abdominal pain. Negative for diarrhea.  Genitourinary: Negative for frequency and hematuria.  Musculoskeletal: Negative for back pain.  Skin: Negative for rash.  Neurological: Negative for seizures and headaches.  Psychiatric/Behavioral: Negative for hallucinations.  Patient complains of abdominal pain and weakness.   Physical Exam Updated Vital Signs BP (!) 112/55 (BP Location: Left Arm)   Pulse 79    Temp 98.4 F (36.9 C) (Oral)   Resp 18   Ht 5\' 1"  (1.549 m)   Wt 62.8 kg   SpO2 100%   BMI 26.15 kg/m   Physical Exam Vitals signs and nursing note reviewed.  Constitutional:      Appearance: She is well-developed.  HENT:     Head: Normocephalic.  Eyes:     General: No scleral icterus.    Conjunctiva/sclera: Conjunctivae normal.  Neck:     Musculoskeletal: Neck supple.     Thyroid: No thyromegaly.  Cardiovascular:     Rate and Rhythm: Normal rate and regular rhythm.     Heart sounds: No murmur. No friction rub. No gallop.   Pulmonary:     Breath sounds: No stridor. No wheezing or rales.  Chest:     Chest wall: No tenderness.  Abdominal:     General: There is no distension.     Tenderness: There is no abdominal tenderness. There is no rebound.  Musculoskeletal: Normal range of motion.  Lymphadenopathy:     Cervical: No cervical adenopathy.  Skin:    Findings: No erythema or rash.  Neurological:     Mental Status: She is oriented to person, place, and time.     Motor: No abnormal muscle tone.     Coordination: Coordination normal.  Psychiatric:        Behavior: Behavior normal.      ED Treatments / Results  Labs (all labs ordered are listed, but only abnormal results are displayed) Labs Reviewed  CBC WITH DIFFERENTIAL/PLATELET - Abnormal; Notable for the following components:      Result Value   RBC 3.72 (*)    Hemoglobin 11.7 (*)    Monocytes Absolute 1.4 (*)    All other components within normal limits  COMPREHENSIVE METABOLIC PANEL - Abnormal; Notable for the following components:   Potassium 2.6 (*)    Glucose, Bld 127 (*)    Total Protein 8.3 (*)    Total Bilirubin 1.8 (*)    All other components within normal limits  URINALYSIS, ROUTINE W REFLEX MICROSCOPIC - Abnormal; Notable for the following components:   Specific Gravity, Urine >1.030 (*)    Glucose, UA 100 (*)    Hgb urine dipstick MODERATE (*)    Bilirubin Urine SMALL (*)    Ketones, ur  TRACE (*)    Protein, ur 30 (*)    All other components within normal limits  URINALYSIS, MICROSCOPIC (REFLEX) - Abnormal; Notable for the following components:   Bacteria, UA RARE (*)    All other components within normal limits  CBC - Abnormal; Notable for the following components:   RBC 3.30 (*)    Hemoglobin 10.5 (*)  HCT 32.7 (*)    All other components within normal limits  CK - Abnormal; Notable for the following components:   Total CK 357 (*)    All other components within normal limits  COMPREHENSIVE METABOLIC PANEL - Abnormal; Notable for the following components:   Potassium 3.3 (*)    Glucose, Bld 112 (*)    Calcium 8.8 (*)    Albumin 3.0 (*)    Total Bilirubin 1.4 (*)    All other components within normal limits  GLUCOSE, CAPILLARY - Abnormal; Notable for the following components:   Glucose-Capillary 103 (*)    All other components within normal limits  IRON AND TIBC - Abnormal; Notable for the following components:   Iron 10 (*)    Saturation Ratios 4 (*)    All other components within normal limits  RETICULOCYTES - Abnormal; Notable for the following components:   RBC. 3.30 (*)    All other components within normal limits  SARS CORONAVIRUS 2 (HOSPITAL ORDER, PERFORMED IN [facility])  LIPASE, BLOOD  MAGNESIUM  PHOSPHORUS  VITAMIN B12  FOLATE  FERRITIN    EKG EKG Interpretation  Date/Time:  Thursday January 22 2019 22:33:38 EDT Ventricular Rate:  83 PR Interval:    QRS Duration: 80 QT Interval:  351 QTC Calculation: 413 R Axis:   80 Text Interpretation:  Sinus rhythm Probable left atrial enlargement Low voltage, precordial leads RSR' in V1 or V2, probably normal variant Borderline T abnormalities, anterior leads Abnormal ECG Confirmed by Gerhard MunchLockwood, Robert [phone]([id number]) on 01/23/2019 4:23:51 PM   Radiology Ct Head Wo Contrast  Result Date: 01/23/2019 CLINICAL DATA:  Metabolic encephalopathy. EXAM: CT HEAD WITHOUT CONTRAST TECHNIQUE: Contiguous  axial images were obtained from the base of the skull through the vertex without intravenous contrast. COMPARISON:  None. FINDINGS: Brain: No evidence of acute infarction, hemorrhage, hydrocephalus, extra-axial collection or mass lesion/mass effect. Vascular: No hyperdense vessel or unexpected calcification. Skull: Normal. Negative for fracture or focal lesion. Sinuses/Orbits: No acute finding. Other: None. IMPRESSION: No acute intracranial abnormalities identified. Electronically Signed   By: Gerome Samavid  Williams III M.D   On: 01/23/2019 18:30   Ct Abdomen Pelvis W Contrast  Result Date: 01/22/2019 CLINICAL DATA:  Lower abdominal pain for 2 weeks EXAM: CT ABDOMEN AND PELVIS WITH CONTRAST TECHNIQUE: Multidetector CT imaging of the abdomen and pelvis was performed using the standard protocol following bolus administration of intravenous contrast. CONTRAST:  100mL OMNIPAQUE IOHEXOL 300 MG/ML  SOLN COMPARISON:  12/23/2018 FINDINGS: Lower chest: Lung bases are well aerated with some minimal scarring bilaterally. Hepatobiliary: No focal liver abnormality is seen. No gallstones, gallbladder wall thickening, or biliary dilatation. Pancreas: Unremarkable. No pancreatic ductal dilatation or surrounding inflammatory changes. Spleen: Normal in size without focal abnormality. Adrenals/Urinary Tract: Adrenal glands are within normal limits. Kidneys demonstrate a normal enhancement pattern bilaterally. Tiny nonobstructing right lower pole renal stone is noted. Normal excretion of contrast is noted on delayed images. The bladder is well distended. Stomach/Bowel: Diverticular change of the colon is noted without evidence of diverticulitis. No obstructive or inflammatory changes are seen. The appendix is air-filled and within normal limits. No small bowel abnormality is noted. Stomach is unremarkable with the exception of a small sliding-type hiatal hernia. Vascular/Lymphatic: Aortic atherosclerosis. No enlarged abdominal or pelvic  lymph nodes. Reproductive: Uterus and bilateral adnexa are unremarkable. Other: No abdominal wall hernia or abnormality. No abdominopelvic ascites. Musculoskeletal: Chronic anterolisthesis of L5 on S1 is seen. Disc space narrowing at L4-5 is noted.  IMPRESSION: Tiny nonobstructing right lower pole renal stone. Diverticulosis without diverticulitis. No acute abnormality noted. Electronically Signed   By: Inez Catalina M.D.   On: 01/22/2019 21:35    Procedures Procedures (including critical care time)  Medications Ordered in ED Medications  enoxaparin (LOVENOX) injection 40 mg (40 mg Subcutaneous Given 01/23/19 2249)  acetaminophen (TYLENOL) tablet 650 mg (650 mg Oral Given 01/23/19 1239)    Or  acetaminophen (TYLENOL) suppository 650 mg ( Rectal See Alternative 01/23/19 1239)  prochlorperazine (COMPAZINE) injection 5 mg (has no administration in time range)  ciprofloxacin (CIPRO) IVPB 400 mg (400 mg Intravenous New Bag/Given 01/24/19 0357)  metroNIDAZOLE (FLAGYL) IVPB 500 mg (500 mg Intravenous New Bag/Given 01/24/19 0243)  morphine 2 MG/ML injection 2 mg (2 mg Intravenous Given 01/24/19 0931)  HYDROmorphone (DILAUDID) injection 0.5 mg (0.5 mg Intravenous Given 01/22/19 1921)  potassium chloride 10 mEq in 100 mL IVPB (0 mEq Intravenous Stopped 01/22/19 2335)  potassium chloride 10 mEq in 100 mL IVPB (0 mEq Intravenous Stopped 01/22/19 2227)  sodium chloride 0.9 % bolus 500 mL (0 mLs Intravenous Stopped 01/22/19 2335)  iohexol (OMNIPAQUE) 300 MG/ML solution 100 mL (100 mLs Intravenous Contrast Given 01/22/19 2112)  potassium chloride SA (K-DUR) CR tablet 40 mEq (40 mEq Oral Given 01/22/19 2333)  magnesium sulfate IVPB 2 g 50 mL (2 g Intravenous New Bag/Given 01/23/19 0041)  potassium chloride SA (K-DUR) CR tablet 40 mEq (40 mEq Oral Given 01/23/19 0920)  potassium chloride SA (K-DUR) CR tablet 40 mEq (40 mEq Oral Given 01/23/19 1754)  potassium chloride SA (K-DUR) CR tablet 40 mEq (40 mEq Oral Given 01/24/19 0958)      Initial Impression / Assessment and Plan / ED Course  I have reviewed the triage vital signs and the nursing notes.  Pertinent labs & imaging results that were available during my care of the patient were reviewed by me and considered in my medical decision making (see chart for details).    CRITICAL CARE Performed by: Milton Ferguson Total critical care time: 35 minutes Critical care time was exclusive of separately billable procedures and treating other patients. Critical care was necessary to treat or prevent imminent or life-threatening deterioration. Critical care was time spent personally by me on the following activities: development of treatment plan with patient and/or surrogate as well as nursing, discussions with consultants, evaluation of patient's response to treatment, examination of patient, obtaining history from patient or surrogate, ordering and performing treatments and interventions, ordering and review of laboratory studies, ordering and review of radiographic studies, pulse oximetry and re-evaluation of patient's condition.     Patient with hypokalemia weakness unsteady gait.  She will be admitted to medicine  Final Clinical Impressions(s) / ED Diagnoses   Final diagnoses:  None    ED Discharge Orders         Ordered    senna-docusate (SENOKOT-S) 8.6-50 MG tablet  Daily at bedtime     01/23/19 1124    ondansetron (ZOFRAN) 4 MG tablet  Every 8 hours PRN     01/23/19 1124    Increase activity slowly     01/23/19 1124    Diet - low sodium heart healthy     01/23/19 1124    Discharge instructions    Comments: 1)Call or return if symptoms persist or worsen 2)Follow- up with PCP for BMP and Serum Magnesium Recheck within 1 week   01/23/19 1124    Call MD for:  temperature >100.4  01/23/19 1124    Call MD for:  persistant nausea and vomiting     01/23/19 1124    Call MD for:  severe uncontrolled pain     01/23/19 1124    Call MD for:  difficulty  breathing, headache or visual disturbances     01/23/19 1124    Call MD for:  persistant dizziness or light-headedness     01/23/19 1124    acetaminophen (TYLENOL) 325 MG tablet  Every 6 hours PRN     01/23/19 1124    Potassium Chloride ER 20 MEQ TBCR  Daily     01/23/19 1126           Bethann BerkshireZammit, Sharin Altidor, MD 01/24/19 1135    Bethann BerkshireZammit, Odalys Win, MD 01/31/19 1615

## 2019-01-25 DIAGNOSIS — F0390 Unspecified dementia without behavioral disturbance: Secondary | ICD-10-CM | POA: Diagnosis not present

## 2019-01-25 DIAGNOSIS — R823 Hemoglobinuria: Secondary | ICD-10-CM | POA: Diagnosis present

## 2019-01-25 DIAGNOSIS — D649 Anemia, unspecified: Secondary | ICD-10-CM | POA: Diagnosis not present

## 2019-01-25 DIAGNOSIS — D519 Vitamin B12 deficiency anemia, unspecified: Secondary | ICD-10-CM | POA: Diagnosis not present

## 2019-01-25 DIAGNOSIS — E869 Volume depletion, unspecified: Secondary | ICD-10-CM | POA: Diagnosis present

## 2019-01-25 DIAGNOSIS — R2681 Unsteadiness on feet: Secondary | ICD-10-CM | POA: Diagnosis present

## 2019-01-25 DIAGNOSIS — R81 Glycosuria: Secondary | ICD-10-CM | POA: Diagnosis present

## 2019-01-25 DIAGNOSIS — Z1159 Encounter for screening for other viral diseases: Secondary | ICD-10-CM | POA: Diagnosis not present

## 2019-01-25 DIAGNOSIS — M25571 Pain in right ankle and joints of right foot: Secondary | ICD-10-CM | POA: Diagnosis not present

## 2019-01-25 DIAGNOSIS — Z9181 History of falling: Secondary | ICD-10-CM | POA: Diagnosis not present

## 2019-01-25 DIAGNOSIS — R262 Difficulty in walking, not elsewhere classified: Secondary | ICD-10-CM | POA: Diagnosis not present

## 2019-01-25 DIAGNOSIS — M25572 Pain in left ankle and joints of left foot: Secondary | ICD-10-CM | POA: Diagnosis not present

## 2019-01-25 DIAGNOSIS — R109 Unspecified abdominal pain: Secondary | ICD-10-CM | POA: Diagnosis present

## 2019-01-25 DIAGNOSIS — E878 Other disorders of electrolyte and fluid balance, not elsewhere classified: Secondary | ICD-10-CM | POA: Diagnosis present

## 2019-01-25 DIAGNOSIS — E876 Hypokalemia: Secondary | ICD-10-CM | POA: Diagnosis not present

## 2019-01-25 DIAGNOSIS — Z825 Family history of asthma and other chronic lower respiratory diseases: Secondary | ICD-10-CM | POA: Diagnosis not present

## 2019-01-25 DIAGNOSIS — R17 Unspecified jaundice: Secondary | ICD-10-CM | POA: Diagnosis present

## 2019-01-25 DIAGNOSIS — G9341 Metabolic encephalopathy: Secondary | ICD-10-CM | POA: Diagnosis not present

## 2019-01-25 DIAGNOSIS — M6281 Muscle weakness (generalized): Secondary | ICD-10-CM | POA: Diagnosis not present

## 2019-01-25 DIAGNOSIS — K5790 Diverticulosis of intestine, part unspecified, without perforation or abscess without bleeding: Secondary | ICD-10-CM | POA: Diagnosis present

## 2019-01-25 DIAGNOSIS — S43429D Sprain of unspecified rotator cuff capsule, subsequent encounter: Secondary | ICD-10-CM | POA: Diagnosis not present

## 2019-01-25 LAB — TSH: TSH: 2.344 u[IU]/mL (ref 0.350–4.500)

## 2019-01-25 MED ORDER — FERROUS SULFATE 325 (65 FE) MG PO TABS
325.0000 mg | ORAL_TABLET | Freq: Every day | ORAL | Status: DC
  Administered 2019-01-26 – 2019-01-27 (×2): 325 mg via ORAL
  Filled 2019-01-25 (×2): qty 1

## 2019-01-25 MED ORDER — CYANOCOBALAMIN 1000 MCG/ML IJ SOLN
1000.0000 ug | Freq: Once | INTRAMUSCULAR | Status: AC
  Administered 2019-01-26: 1000 ug via INTRAMUSCULAR
  Filled 2019-01-25: qty 1

## 2019-01-25 NOTE — Progress Notes (Signed)
Patient Demographics:    Wendy Mccann, is a 73 y.o. female, DOB - 02-11-1946, DGU:[id number]  Admit date - 01/22/2019   Admitting Physician Bobette Mo, MD  Outpatient Primary MD for the patient is [facility]  LOS - 0   Chief Complaint  Patient presents with   Abdominal Pain        Subjective:    Wendy Mccann today has no fevers, no emesis,  No chest pain, patient was getting herself dressed she thought she was home.... She wandered out of her room in a very unsteady fashion--- staff had to help her back into the room to avoid falling  Assessment  & Plan :    Principal Problem:   Hypokalemia Active Problems:   Generalized weakness   Abdominal pain   Volume depletion   Hyperbilirubinemia   Normocytic anemia  Brief summary 73 year old without significant underlying chronic medical problems admitted on 01/22/2019 with generalized weakness and abdominal pain and found to have electrolyte abnormalities, postop colonization patient was noted to have significant weakness gait problems and high fall risk, CKs were mildly elevated on admission, patient also found to have intermittent episodes of confusion or disorientation --Patient with very poor insight, cognitive concerns,   A/p 1) metabolic encephalopathy--- patient with intermittent episodes of confusion disorientation in the setting of electrolyte abnormalities however she also has significant weakness and gait problems  CT head  without intracranial pathology.  LFTs are not elevated except for bilirubin.   2)FEN/Hypokalemia--- replaced, etiology unclear, magnesium and phosphorus were normal  3)Abdominal pain---- mostly resolved at this time, CT abdomen and pelvis on admission on 01/22/2019 without acute findings, patient previously had a CT abdomen and pelvis as well on 12/23/2018 that was also negative for acute  findings , low clinical index of suspicion for diverticulitis may discontinue antibiotics after today... Patient apparently had low-grade fevers on admission currently afebrile   4)Hyperbilirubinemia--- CT abdomen and pelvis on admission on 01/22/2019 without acute findings, patient previously had a CT abdomen and pelvis as well on 12/23/2018 that was also negative for acute findings.    5)Mild Normocytic anemia--- folic acid and B12 are low normal, low serum iron noted, low iron saturation noted, ferritin is not low, hemoglobin stable above 11, anticipate some drop in H&H with hydration... No indication for further inpatient work-up at this time -Give IM B12 shots especially given concerns about dementia, give iron supplementation  6)Generalized weakness--Physical therapy eval appreciated, CT head without acute findings  7)Disposition--- patient lives alone, admitted with generalized weakness elevated CPK unsteady gait low-grade fevers, fall risk and electrolyte abnormalities----physical therapy recommends SNF rehab.... Unsafe to discharge home at this time... Given confusional episodes and gait issues very high risk for self injury  8)Dementia--- as per patient's sister patient has been having cognitive decline over the last 4 years to the point where at work she was having difficulty making salads so she was told to quit cooking food and focus on cleaning the kitchen... She apparently has become increasingly more forgetful with memory deficits and cognitive deficits requiring more help with executive decision-making over the last 4 years particularly over the last 6 months -- RPR pending and TSH is 2.3, CT head without acute findings, c/n  Aricept, B12 borderline low will give IM B12 shots  Disposition/Need for in-Hospital Stay- patient unable to be discharged at this time due to - continues to have intermittent episodes of disorientation and confusion, PTA she lives alone, unable to safely  discharge home--- awaiting SNF placement  Code Status : Full  Family Communication:   Discussed with  sister Ms Tomma Lightning [phone]  Disposition Plan  : SNF Rehab  Consults  :  Physical Therapy  DVT Prophylaxis  :  Lovenox -  SCDs   Lab Results  Component Value Date   PLT 241 01/23/2019    Inpatient Medications  Scheduled Meds:  cyanocobalamin  1,000 mcg Intramuscular Once   donepezil  5 mg Oral QHS   enoxaparin (LOVENOX) injection  40 mg Subcutaneous Q24H   QUEtiapine  25 mg Oral QHS   Continuous Infusions:  PRN Meds:.acetaminophen **OR** acetaminophen, LORazepam, morphine injection, prochlorperazine    Anti-infectives (From admission, onward)   Start     Dose/Rate Route Frequency Ordered Stop   01/23/19 0300  ciprofloxacin (CIPRO) IVPB 400 mg  Status:  Discontinued     400 mg 200 mL/hr over 60 Minutes Intravenous Every 12 hours 01/23/19 0256 01/24/19 1457   01/23/19 0300  metroNIDAZOLE (FLAGYL) IVPB 500 mg  Status:  Discontinued     500 mg 100 mL/hr over 60 Minutes Intravenous Every 8 hours 01/23/19 0256 01/24/19 1457        Objective:   Vitals:   01/24/19 1946 01/24/19 2143 01/25/19 0518 01/25/19 1412  BP:  (!) 142/87 113/60 124/68  Pulse:  90 74 84  Resp:  19  18  Temp:  98.2 F (36.8 C) 98 F (36.7 C)   TempSrc:  Oral Oral   SpO2: 98% 100% 100% 100%  Weight:      Height:        Wt Readings from Last 3 Encounters:  01/22/19 62.8 kg  12/23/18 63 kg  05/19/18 54.4 kg     Intake/Output Summary (Last 24 hours) at 01/25/2019 1703 Last data filed at 01/25/2019 1200 Gross per 24 hour  Intake 480 ml  Output 500 ml  Net -20 ml     Physical Exam  Gen:- Awake Alert, in no apparent distress  HEENT:- [place].AT, No sclera icterus Neck-Supple Neck,No JVD,.  Lungs-  CTAB , fair symmetrical air movement CV- S1, S2 normal, regular  Abd-  +ve B.Sounds, Abd Soft, No tenderness,    Extremity/Skin:- No  edema, pedal pulses present    Psych-patient continues to have intermittent episodes of confusion and disorientation  neuro-generalized weakness, unsteady gait , no tremors   Data Review:   Micro Results Recent Results (from the past 240 hour(s))  SARS Coronavirus 2 (CEPHEID- Performed in [facility] hospital lab), Hosp Order     Status: None   Collection Time: 01/22/19 10:21 PM   Specimen: Nasopharyngeal Swab  Result Value Ref Range Status   SARS Coronavirus 2 NEGATIVE NEGATIVE Final    Comment: (NOTE) If result is NEGATIVE SARS-CoV-2 target nucleic acids are NOT DETECTED. The SARS-CoV-2 RNA is generally detectable in upper and lower  respiratory specimens during the acute phase of infection. The lowest  concentration of SARS-CoV-2 viral copies this assay can detect is 250  copies / mL. A negative result does not preclude SARS-CoV-2 infection  and should not be used as the sole basis for treatment or other  patient management decisions.  A negative result may occur with  improper specimen collection /  handling, submission of specimen other  than nasopharyngeal swab, presence of viral mutation(s) within the  areas targeted by this assay, and inadequate number of viral copies  (<250 copies / mL). A negative result must be combined with clinical  observations, patient history, and epidemiological information. If result is POSITIVE SARS-CoV-2 target nucleic acids are DETECTED. The SARS-CoV-2 RNA is generally detectable in upper and lower  respiratory specimens dur ing the acute phase of infection.  Positive  results are indicative of active infection with SARS-CoV-2.  Clinical  correlation with patient history and other diagnostic information is  necessary to determine patient infection status.  Positive results do  not rule out bacterial infection or co-infection with other viruses. If result is PRESUMPTIVE POSTIVE SARS-CoV-2 nucleic acids MAY BE PRESENT.   A presumptive positive result was obtained on the  submitted specimen  and confirmed on repeat testing.  While 2019 novel coronavirus  (SARS-CoV-2) nucleic acids may be present in the submitted sample  additional confirmatory testing may be necessary for epidemiological  and / or clinical management purposes  to differentiate between  SARS-CoV-2 and other Sarbecovirus currently known to infect humans.  If clinically indicated additional testing with an alternate test  methodology [phone]) is advised. The SARS-CoV-2 RNA is generally  detectable in upper and lower respiratory sp ecimens during the acute  phase of infection. The expected result is Negative. Fact Sheet for Patients:  StrictlyIdeas.no Fact Sheet for Healthcare Providers: BankingDealers.co.za This test is not yet approved or cleared by the [place] FDA and has been authorized for detection and/or diagnosis of SARS-CoV-2 by FDA under an Emergency Use Authorization (EUA).  This EUA will remain in effect (meaning this test can be used) for the duration of the COVID-19 declaration under Section 564(b)(1) of the Act, 21 U.S.C. section 360bbb-3(b)(1), unless the authorization is terminated or revoked sooner. Performed at [facility], [address], [place], [place] [address]     Radiology Reports Ct Head Wo Contrast  Result Date: 01/23/2019 CLINICAL DATA:  Metabolic encephalopathy. EXAM: CT HEAD WITHOUT CONTRAST TECHNIQUE: Contiguous axial images were obtained from the base of the skull through the vertex without intravenous contrast. COMPARISON:  None. FINDINGS: Brain: No evidence of acute infarction, hemorrhage, hydrocephalus, extra-axial collection or mass lesion/mass effect. Vascular: No hyperdense vessel or unexpected calcification. Skull: Normal. Negative for fracture or focal lesion. Sinuses/Orbits: No acute finding. Other: None. IMPRESSION: No acute intracranial abnormalities identified. Electronically Signed   By: Dorise Bullion III M.D   On: 01/23/2019 18:30   Ct Abdomen Pelvis W Contrast  Result Date: 01/22/2019 CLINICAL DATA:  Lower abdominal pain for 2 weeks EXAM: CT ABDOMEN AND PELVIS WITH CONTRAST TECHNIQUE: Multidetector CT imaging of the abdomen and pelvis was performed using the standard protocol following bolus administration of intravenous contrast. CONTRAST:  130mL OMNIPAQUE IOHEXOL 300 MG/ML  SOLN COMPARISON:  12/23/2018 FINDINGS: Lower chest: Lung bases are well aerated with some minimal scarring bilaterally. Hepatobiliary: No focal liver abnormality is seen. No gallstones, gallbladder wall thickening, or biliary dilatation. Pancreas: Unremarkable. No pancreatic ductal dilatation or surrounding inflammatory changes. Spleen: Normal in size without focal abnormality. Adrenals/Urinary Tract: Adrenal glands are within normal limits. Kidneys demonstrate a normal enhancement pattern bilaterally. Tiny nonobstructing right lower pole renal stone is noted. Normal excretion of contrast is noted on delayed images. The bladder is well distended. Stomach/Bowel: Diverticular change of the colon is noted without evidence of diverticulitis. No obstructive or inflammatory changes are seen. The appendix is air-filled  and within normal limits. No small bowel abnormality is noted. Stomach is unremarkable with the exception of a small sliding-type hiatal hernia. Vascular/Lymphatic: Aortic atherosclerosis. No enlarged abdominal or pelvic lymph nodes. Reproductive: Uterus and bilateral adnexa are unremarkable. Other: No abdominal wall hernia or abnormality. No abdominopelvic ascites. Musculoskeletal: Chronic anterolisthesis of L5 on S1 is seen. Disc space narrowing at L4-5 is noted. IMPRESSION: Tiny nonobstructing right lower pole renal stone. Diverticulosis without diverticulitis. No acute abnormality noted. Electronically Signed   By: Alcide CleverMark  Lukens M.D.   On: 01/22/2019 21:35     CBC Recent Labs  Lab 01/22/19 1922 01/23/19 0504    WBC 8.9 10.2  HGB 11.7* 10.5*  HCT 36.0 32.7*  PLT 261 241  MCV 96.8 99.1  MCH 31.5 31.8  MCHC 32.5 32.1  RDW 12.4 12.6  LYMPHSABS 1.0  --   MONOABS 1.4*  --   EOSABS 0.0  --   BASOSABS 0.0  --     Chemistries  Recent Labs  Lab 01/22/19 1922 01/23/19 0504  NA 138 139  K 2.6* 3.3*  CL 103 105  CO2 26 26  GLUCOSE 127* 112*  BUN 13 9  CREATININE 0.94 0.66  CALCIUM 9.3 8.8*  MG 2.3  --   AST 22 18  ALT 14 12  ALKPHOS 72 61  BILITOT 1.8* 1.4*   ------------------------------------------------------------------------------------------------------------------ No results for input(s): CHOL, HDL, LDLCALC, TRIG, CHOLHDL, LDLDIRECT in the last 72 hours.  No results found for: HGBA1C ------------------------------------------------------------------------------------------------------------------ Recent Labs    01/25/19 0512  TSH 2.344   ------------------------------------------------------------------------------------------------------------------ Recent Labs    01/23/19 0504  VITAMINB12 189  FOLATE 6.2  FERRITIN 214  TIBC 267  IRON 10*  RETICCTPCT 1.3    Coagulation profile No results for input(s): INR, PROTIME in the last 168 hours.  No results for input(s): DDIMER in the last 72 hours.  Cardiac Enzymes No results for input(s): CKMB, TROPONINI, MYOGLOBIN in the last 168 hours.  Invalid input(s): CK ------------------------------------------------------------------------------------------------------------------ No results found for: BNP   Shon Haleourage Dail Lerew M.D on 01/25/2019 at 5:03 PM  Go to [email or web address] - for contact info  [facility] - Office  [phone]

## 2019-01-26 LAB — RPR: RPR Ser Ql: NONREACTIVE

## 2019-01-26 NOTE — TOC Progression Note (Signed)
Transition of Care [facility]) - Progression Note    Patient Details  Name: ALLIYA MARCON MRN: [id number] Date of Birth: June 19, 1946  Transition of Care [facility]) CM/SW Contact  Stryder Poitra, Chauncey Reading, RN Phone Number: 01/26/2019, 1:55 PM  Clinical Narrative:   Authorization has been started by [facility].    Expected Discharge Plan: [place] Barriers to Discharge: Continued Medical Work up  Expected Discharge Plan and Services Expected Discharge Plan: [place] In-house Referral: Clinical Social Work     Living arrangements for the past 2 months: Single Family Home Expected Discharge Date: 01/23/19                                     Social Determinants of Health (SDOH) Interventions    Readmission Risk Interventions No flowsheet data found.

## 2019-01-26 NOTE — Progress Notes (Signed)
Physical Therapy Treatment Patient Details Name: BRECKLYNN JIAN MRN: [id number] DOB: [birth year] Today's Date: 01/26/2019    History of Present Illness RAESHA COONROD is a 73 y.o. female with medical history significant of rotator cuff capsule sprain who is coming to the emergency department due to a 2-week history of lower abdominal pain that usually gets relieved with a bowel movement or urination for a day or so, but today the pain persisted and worsened.  She feels fatigued.  Her stools are soft.  She denies nausea, emesis, constipation, melena or hematochezia.  No dysuria, frequency or hematuria.  She denies fever, chills, sore throat, rhinorrhea, dyspnea, wheezing or hemoptysis.  No chest pain, palpitations, diaphoresis, dizziness, PND, orthopnea or lower extremity edema.  No polyuria, polydipsia, polyphagia or blurred vision.    PT Comments    Patient agreeable to therapy and demonstrates increased endurance/distance for ambulation without loss of balance, required verbal cues to use RW for balancing without picking it up with fair/good carryover, transferred to commode in bathroom, stood in front of sink to wash face without loss of balance and tolerated sitting up in chair after therapy.  Patient will benefit from continued physical therapy in hospital and recommended venue below to increase strength, balance, endurance for safe ADLs and gait.    Follow Up Recommendations  SNF;Supervision for mobility/OOB;Supervision/Assistance - 24 hour     Equipment Recommendations  Rolling walker with 5" wheels    Recommendations for Other Services       Precautions / Restrictions Precautions Precautions: Fall Restrictions Weight Bearing Restrictions: No    Mobility  Bed Mobility Overal bed mobility: Needs Assistance Bed Mobility: Supine to Sit     Supine to sit: Supervision     General bed mobility comments: slightly labored movement, increased time  Transfers Overall transfer  level: Needs assistance Equipment used: Rolling walker (2 wheeled) Transfers: Sit to/from [employer] Sit to Stand: Min guard Stand pivot transfers: Min guard       General transfer comment: increased BLE strength for sit to stands and transfers  Ambulation/Gait Ambulation/Gait assistance: Min guard Gait Distance (Feet): 65 Feet Assistive device: Rolling walker (2 wheeled) Gait Pattern/deviations: Decreased step length - right;Decreased step length - left;Decreased stride length Gait velocity: decreased   General Gait Details: increased endurance/distance for ambulation with slightly labored cadence, required verbal cues to not pickup RW when taking steps, but to push and use for balancing with fair/good carryover demonstrated   Stairs             Wheelchair Mobility    Modified Rankin (Stroke Patients Only)       Balance Overall balance assessment: Needs assistance Sitting-balance support: Feet supported;No upper extremity supported Sitting balance-Leahy Scale: Good Sitting balance - Comments: seated at bedside   Standing balance support: Bilateral upper extremity supported;During functional activity Standing balance-Leahy Scale: Fair Standing balance comment: fair using RW                            Cognition Arousal/Alertness: Awake/alert Behavior During Therapy: WFL for tasks assessed/performed Overall Cognitive Status: Within Functional Limits for tasks assessed                                        Exercises General Exercises - Lower Extremity Long Arc Quad: Seated;AROM;Strengthening;Both;10 reps Hip Flexion/Marching: Seated;AROM;Strengthening;Both;10 reps Toe  Raises: Seated;AROM;Strengthening;Both;10 reps Heel Raises: Seated;AROM;Strengthening;Both;10 reps    General Comments        Pertinent Vitals/Pain Pain Assessment: Faces Faces Pain Scale: Hurts a little bit Pain Location: bilateral ankles, left  worse than right Pain Descriptors / Indicators: Numbness;Sore Pain Intervention(s): Limited activity within patient's tolerance;Monitored during session    Home Living                      Prior Function            PT Goals (current goals can now be found in the care plan section) Acute Rehab PT Goals Patient Stated Goal: return home with family to assist PT Goal Formulation: With patient Time For Goal Achievement: 02/06/19 Potential to Achieve Goals: Good Progress towards PT goals: Progressing toward goals    Frequency    Min 3X/week      PT Plan Current plan remains appropriate    Co-evaluation              AM-PAC PT "6 Clicks" Mobility   Outcome Measure  Help needed turning from your back to your side while in a flat bed without using bedrails?: None Help needed moving from lying on your back to sitting on the side of a flat bed without using bedrails?: None Help needed moving to and from a bed to a chair (including a wheelchair)?: A Little Help needed standing up from a chair using your arms (e.g., wheelchair or bedside chair)?: A Little Help needed to walk in hospital room?: A Little Help needed climbing 3-5 steps with a railing? : A Lot 6 Click Score: 19    End of Session   Activity Tolerance: Patient tolerated treatment well;Patient limited by fatigue Patient left: in chair;with call bell/phone within reach;with chair alarm set Nurse Communication: Mobility status PT Visit Diagnosis: Unsteadiness on feet (R26.81);Other abnormalities of gait and mobility (R26.89);Muscle weakness (generalized) (M62.81)     Time: 2202-5427 PT Time Calculation (min) (ACUTE ONLY): 36 min  Charges:  $Gait Training: 8-22 mins $Therapeutic Exercise: 8-22 mins                     11:21 AM, 01/26/19 Lonell Grandchild, MPT Physical Therapist with [facility] [phone] office [phone] mobile phone

## 2019-01-26 NOTE — Progress Notes (Signed)
Patient Demographics:    Wendy Mccann, is a 73 y.o. female, DOB - February 13, 1946, MWN:[id number]  Admit date - 01/22/2019   Admitting Physician Reubin Milan, MD  Outpatient Primary MD for the patient is [facility]  LOS - 1   Chief Complaint  Patient presents with   Abdominal Pain        Subjective:    [name] today has no fevers, no emesis,  No chest pain, attempted confusion persist ,no aggression no agitation  Assessment  & Plan :    Principal Problem:   Hypokalemia Active Problems:   Generalized weakness   Abdominal pain   Volume depletion   Hyperbilirubinemia   Normocytic anemia  Brief summary 73 year old without significant underlying chronic medical problems admitted on 01/22/2019 with generalized weakness and abdominal pain and found to have electrolyte abnormalities, postop colonization patient was noted to have significant weakness gait problems and high fall risk, CKs were mildly elevated on admission, patient also found to have intermittent episodes of confusion or disorientation --Patient with very poor insight, cognitive concerns,   A/p 1) metabolic encephalopathy--- patient with intermittent episodes of confusion disorientation in the setting of electrolyte abnormalities however she also has significant weakness and gait problems  CT head  without intracranial pathology.  LFTs are not elevated except for bilirubin.  -Suspect underlying dementia  2)FEN/Hypokalemia--- replaced, etiology unclear, magnesium and phosphorus were normal  3)Abdominal pain---- mostly resolved at this time, CT abdomen and pelvis on admission on 01/22/2019 without acute findings, patient previously had a CT abdomen and pelvis as well on 12/23/2018 that was also negative for acute findings , low clinical index of suspicion for diverticulitis may discontinue antibiotics after  today... Patient apparently had low-grade fevers on admission currently afebrile   4)Hyperbilirubinemia--- CT abdomen and pelvis on admission on 01/22/2019 without acute findings, patient previously had a CT abdomen and pelvis as well on 12/23/2018 that was also negative for acute findings.    5)Mild Normocytic anemia--- folic acid and Q03 are low normal, low serum iron noted, low iron saturation noted, ferritin is not low, hemoglobin stable above 11, anticipate some drop in H&H with hydration... No indication for further inpatient work-up at this time -Give IM B12 shots especially given concerns about dementia, give iron supplementation  6)Generalized weakness--Physical therapy eval appreciated, CT head without acute findings, needs SNF rehab  7)Disposition--- patient lives alone, admitted with generalized weakness elevated CPK unsteady gait low-grade fevers, fall risk and electrolyte abnormalities----physical therapy recommends SNF rehab.... Unsafe to discharge home at this time... Given confusional episodes and gait issues very high risk for self injury  8)Dementia--- as per patient's sister patient has been having cognitive decline over the last 4 years to the point where at work she was having difficulty making salads so she was told to quit cooking food and focus on cleaning the kitchen... She apparently has become increasingly more forgetful with memory deficits and cognitive deficits requiring more help with executive decision-making over the last 4 years particularly over the last 6 months -- RPR is nonreactive and TSH is 2.3, CT head without acute findings, c/n Aricept, B12 borderline low, received IM B12 shot on 01/25/2019  Disposition/Need for in-Hospital Stay- patient unable to be  discharged at this time due to - continues to have intermittent episodes of disorientation and confusion, PTA she lives alone, unable to safely discharge home--- awaiting SNF placement  Code Status :  Full  Family Communication:   Discussed with  sister Ms Tomma Lightning [phone]  Disposition Plan  : SNF Rehab  Consults  :  Physical Therapy  DVT Prophylaxis  :  Lovenox -  SCDs   Lab Results  Component Value Date   PLT 241 01/23/2019    Inpatient Medications  Scheduled Meds:  donepezil  5 mg Oral QHS   enoxaparin (LOVENOX) injection  40 mg Subcutaneous Q24H   ferrous sulfate  325 mg Oral Q breakfast   QUEtiapine  25 mg Oral QHS   Continuous Infusions:  PRN Meds:.acetaminophen **OR** acetaminophen, LORazepam, morphine injection, prochlorperazine    Anti-infectives (From admission, onward)   Start     Dose/Rate Route Frequency Ordered Stop   01/23/19 0300  ciprofloxacin (CIPRO) IVPB 400 mg  Status:  Discontinued     400 mg 200 mL/hr over 60 Minutes Intravenous Every 12 hours 01/23/19 0256 01/24/19 1457   01/23/19 0300  metroNIDAZOLE (FLAGYL) IVPB 500 mg  Status:  Discontinued     500 mg 100 mL/hr over 60 Minutes Intravenous Every 8 hours 01/23/19 0256 01/24/19 1457        Objective:   Vitals:   01/25/19 2111 01/26/19 0558 01/26/19 0757 01/26/19 1325  BP: 108/63 113/67  125/76  Pulse: 70 65  87  Resp:  18  18  Temp:  98.2 F (36.8 C)  98.3 F (36.8 C)  TempSrc:  Oral  Oral  SpO2:  98% 99% 100%  Weight:      Height:        Wt Readings from Last 3 Encounters:  01/22/19 62.8 kg  12/23/18 63 kg  05/19/18 54.4 kg     Intake/Output Summary (Last 24 hours) at 01/26/2019 1919 Last data filed at 01/26/2019 1756 Gross per 24 hour  Intake 720 ml  Output --  Net 720 ml     Physical Exam  Gen:- Awake Alert, in no apparent distress  HEENT:- [place].AT, No sclera icterus Neck-Supple Neck,No JVD,.  Lungs-  CTAB , fair symmetrical air movement CV- S1, S2 normal, regular  Abd-  +ve B.Sounds, Abd Soft, No tenderness,    Extremity/Skin:- No  edema, pedal pulses present  Psych-patient continues to have intermittent episodes of confusion and  disorientation  neuro-generalized weakness, unsteady gait , no tremors   Data Review:   Micro Results Recent Results (from the past 240 hour(s))  SARS Coronavirus 2 (CEPHEID- Performed in [facility] hospital lab), Hosp Order     Status: None   Collection Time: 01/22/19 10:21 PM   Specimen: Nasopharyngeal Swab  Result Value Ref Range Status   SARS Coronavirus 2 NEGATIVE NEGATIVE Final    Comment: (NOTE) If result is NEGATIVE SARS-CoV-2 target nucleic acids are NOT DETECTED. The SARS-CoV-2 RNA is generally detectable in upper and lower  respiratory specimens during the acute phase of infection. The lowest  concentration of SARS-CoV-2 viral copies this assay can detect is 250  copies / mL. A negative result does not preclude SARS-CoV-2 infection  and should not be used as the sole basis for treatment or other  patient management decisions.  A negative result may occur with  improper specimen collection / handling, submission of specimen other  than nasopharyngeal swab, presence of viral mutation(s) within the  areas targeted by  this assay, and inadequate number of viral copies  (<250 copies / mL). A negative result must be combined with clinical  observations, patient history, and epidemiological information. If result is POSITIVE SARS-CoV-2 target nucleic acids are DETECTED. The SARS-CoV-2 RNA is generally detectable in upper and lower  respiratory specimens dur ing the acute phase of infection.  Positive  results are indicative of active infection with SARS-CoV-2.  Clinical  correlation with patient history and other diagnostic information is  necessary to determine patient infection status.  Positive results do  not rule out bacterial infection or co-infection with other viruses. If result is PRESUMPTIVE POSTIVE SARS-CoV-2 nucleic acids MAY BE PRESENT.   A presumptive positive result was obtained on the submitted specimen  and confirmed on repeat testing.  While 2019 novel  coronavirus  (SARS-CoV-2) nucleic acids may be present in the submitted sample  additional confirmatory testing may be necessary for epidemiological  and / or clinical management purposes  to differentiate between  SARS-CoV-2 and other Sarbecovirus currently known to infect humans.  If clinically indicated additional testing with an alternate test  methodology [phone]) is advised. The SARS-CoV-2 RNA is generally  detectable in upper and lower respiratory sp ecimens during the acute  phase of infection. The expected result is Negative. Fact Sheet for Patients:  BoilerBrush.com.cy Fact Sheet for Healthcare Providers: https://pope.com/ This test is not yet approved or cleared by the [place] FDA and has been authorized for detection and/or diagnosis of SARS-CoV-2 by FDA under an Emergency Use Authorization (EUA).  This EUA will remain in effect (meaning this test can be used) for the duration of the COVID-19 declaration under Section 564(b)(1) of the Act, 21 U.S.C. section 360bbb-3(b)(1), unless the authorization is terminated or revoked sooner. Performed at [facility], [address], [place], [place] [address]     Radiology Reports Ct Head Wo Contrast  Result Date: 01/23/2019 CLINICAL DATA:  Metabolic encephalopathy. EXAM: CT HEAD WITHOUT CONTRAST TECHNIQUE: Contiguous axial images were obtained from the base of the skull through the vertex without intravenous contrast. COMPARISON:  None. FINDINGS: Brain: No evidence of acute infarction, hemorrhage, hydrocephalus, extra-axial collection or mass lesion/mass effect. Vascular: No hyperdense vessel or unexpected calcification. Skull: Normal. Negative for fracture or focal lesion. Sinuses/Orbits: No acute finding. Other: None. IMPRESSION: No acute intracranial abnormalities identified. Electronically Signed   By: Gerome Sam III M.D   On: 01/23/2019 18:30   Ct Abdomen Pelvis W  Contrast  Result Date: 01/22/2019 CLINICAL DATA:  Lower abdominal pain for 2 weeks EXAM: CT ABDOMEN AND PELVIS WITH CONTRAST TECHNIQUE: Multidetector CT imaging of the abdomen and pelvis was performed using the standard protocol following bolus administration of intravenous contrast. CONTRAST:  OMNIPAQUE IOHEXOL 300 MG/ML  SOLN COMPARISON:  12/23/2018 FINDINGS: Lower chest: Lung bases are well aerated with some minimal scarring bilaterally. Hepatobiliary: No focal liver abnormality is seen. No gallstones, gallbladder wall thickening, or biliary dilatation. Pancreas: Unremarkable. No pancreatic ductal dilatation or surrounding inflammatory changes. Spleen: Normal in size without focal abnormality. Adrenals/Urinary Tract: Adrenal glands are within normal limits. Kidneys demonstrate a normal enhancement pattern bilaterally. Tiny nonobstructing right lower pole renal stone is noted. Normal excretion of contrast is noted on delayed images. The bladder is well distended. Stomach/Bowel: Diverticular change of the colon is noted without evidence of diverticulitis. No obstructive or inflammatory changes are seen. The appendix is air-filled and within normal limits. No small bowel abnormality is noted. Stomach is unremarkable with the exception of a small  sliding-type hiatal hernia. Vascular/Lymphatic: Aortic atherosclerosis. No enlarged abdominal or pelvic lymph nodes. Reproductive: Uterus and bilateral adnexa are unremarkable. Other: No abdominal wall hernia or abnormality. No abdominopelvic ascites. Musculoskeletal: Chronic anterolisthesis of L5 on S1 is seen. Disc space narrowing at L4-5 is noted. IMPRESSION: Tiny nonobstructing right lower pole renal stone. Diverticulosis without diverticulitis. No acute abnormality noted. Electronically Signed   By: Alcide CleverMark  Lukens M.D.   On: 01/22/2019 21:35     CBC Recent Labs  Lab 01/22/19 1922 01/23/19 0504  WBC 8.9 10.2  HGB 11.7* 10.5*  HCT 36.0 32.7*  PLT 261 241   MCV 96.8 99.1  MCH 31.5 31.8  MCHC 32.5 32.1  RDW 12.4 12.6  LYMPHSABS 1.0  --   MONOABS 1.4*  --   EOSABS 0.0  --   BASOSABS 0.0  --     Chemistries  Recent Labs  Lab 01/22/19 1922 01/23/19 0504  NA 138 139  K 2.6* 3.3*  CL 103 105  CO2 26 26  GLUCOSE 127* 112*  BUN 13 9  CREATININE 0.94 0.66  CALCIUM 9.3 8.8*  MG 2.3  --   AST 22 18  ALT 14 12  ALKPHOS 72 61  BILITOT 1.8* 1.4*   ------------------------------------------------------------------------------------------------------------------ No results for input(s): CHOL, HDL, LDLCALC, TRIG, CHOLHDL, LDLDIRECT in the last 72 hours.  No results found for: HGBA1C ------------------------------------------------------------------------------------------------------------------ Recent Labs    01/25/19 0512  TSH 2.344   ------------------------------------------------------------------------------------------------------------------ No results for input(s): VITAMINB12, FOLATE, FERRITIN, TIBC, IRON, RETICCTPCT in the last 72 hours.  Coagulation profile No results for input(s): INR, PROTIME in the last 168 hours.  No results for input(s): DDIMER in the last 72 hours.  Cardiac Enzymes No results for input(s): CKMB, TROPONINI, MYOGLOBIN in the last 168 hours.  Invalid input(s): CK ------------------------------------------------------------------------------------------------------------------ No results found for: BNP   Shon Haleourage Loxley Cibrian M.D on 01/26/2019 at 7:19 PM  Go to [email or web address] - for contact info  [facility] - Office  [phone]

## 2019-01-26 NOTE — Progress Notes (Signed)
Spoke with pt's sister Latanya Presser). Sister may visit r/t pt's  dementia and Hx of wondering. She will visit tomorrow.

## 2019-01-27 ENCOUNTER — Other Ambulatory Visit: Payer: Self-pay | Admitting: *Deleted

## 2019-01-27 ENCOUNTER — Inpatient Hospital Stay
Admission: RE | Admit: 2019-01-27 | Discharge: 2019-01-28 | Disposition: A | Discharge status: Home / Self Care | Payer: Medicare Other | Source: Ambulatory Visit | Attending: Internal Medicine | Admitting: Internal Medicine

## 2019-01-27 DIAGNOSIS — D649 Anemia, unspecified: Secondary | ICD-10-CM | POA: Diagnosis not present

## 2019-01-27 DIAGNOSIS — D519 Vitamin B12 deficiency anemia, unspecified: Secondary | ICD-10-CM | POA: Diagnosis not present

## 2019-01-27 DIAGNOSIS — G9341 Metabolic encephalopathy: Secondary | ICD-10-CM | POA: Diagnosis not present

## 2019-01-27 DIAGNOSIS — R531 Weakness: Secondary | ICD-10-CM | POA: Diagnosis not present

## 2019-01-27 DIAGNOSIS — M25572 Pain in left ankle and joints of left foot: Secondary | ICD-10-CM | POA: Diagnosis not present

## 2019-01-27 DIAGNOSIS — R262 Difficulty in walking, not elsewhere classified: Secondary | ICD-10-CM | POA: Diagnosis not present

## 2019-01-27 DIAGNOSIS — M25571 Pain in right ankle and joints of right foot: Secondary | ICD-10-CM | POA: Diagnosis not present

## 2019-01-27 DIAGNOSIS — F0390 Unspecified dementia without behavioral disturbance: Secondary | ICD-10-CM | POA: Diagnosis not present

## 2019-01-27 DIAGNOSIS — E876 Hypokalemia: Secondary | ICD-10-CM | POA: Diagnosis not present

## 2019-01-27 DIAGNOSIS — M6281 Muscle weakness (generalized): Secondary | ICD-10-CM | POA: Diagnosis not present

## 2019-01-27 MED ORDER — FERROUS SULFATE 325 (65 FE) MG PO TABS: 325.0000 mg | ORAL_TABLET | Freq: Every day | ORAL | 3 refills | Status: DC

## 2019-01-27 MED ORDER — QUETIAPINE FUMARATE 25 MG PO TABS: 25.0000 mg | ORAL_TABLET | Freq: Every day | ORAL | 4 refills | Status: DC

## 2019-01-27 MED ORDER — CYANOCOBALAMIN 1000 MCG/ML IJ SOLN: 1000.0000 ug | INTRAMUSCULAR | 11 refills | Status: DC

## 2019-01-27 MED ORDER — DONEPEZIL HCL 5 MG PO TABS: 5.0000 mg | ORAL_TABLET | Freq: Every day | ORAL | 3 refills | Status: DC

## 2019-01-27 MED ORDER — LORAZEPAM 0.5 MG PO TABS: 0.5000 mg | ORAL_TABLET | Freq: Two times a day (BID) | ORAL | 0 refills | Status: DC | PRN

## 2019-01-27 NOTE — Plan of Care (Signed)

## 2019-01-27 NOTE — Discharge Summary (Signed)
Wendy Mccann, is a 73 y.o. female  DOB Jan 22, 1946  MRN [id number].  Admission date:  01/22/2019  Admitting Physician  Reubin Milan, MD  Discharge Date:  01/27/2019   Primary MD  Pllc, [facility]  Recommendations for primary care physician for things to follow:   1) fall precautions 2) vitamin B 12 injections monthly as ordered--next dose due date 07/05/2019  Admission Diagnosis  Abdomial Pain   Discharge Diagnosis  Abdomial Pain    Principal Problem:   Hypokalemia Active Problems:   Generalized weakness   Abdominal pain   Volume depletion   Hyperbilirubinemia   Normocytic anemia      Past Medical History:  Diagnosis Date   Medical history non-contributory    Rotator cuff (capsule) sprain     Past Surgical History:  Procedure Laterality Date   ankles         HPI  from the history and physical done on the day of admission:    Chief Complaint: Abdominal pain.  HPI: Wendy Mccann is a 73 y.o. female with medical history significant of rotator cuff capsule sprain who is coming to the emergency department due to a 2-week history of lower abdominal pain that usually gets relieved with a bowel movement or urination for a day or so, but today the pain persisted and worsened.  She feels fatigued.  Her stools are soft.  She denies nausea, emesis, constipation, melena or hematochezia.  No dysuria, frequency or hematuria.  She denies fever, chills, sore throat, rhinorrhea, dyspnea, wheezing or hemoptysis.  No chest pain, palpitations, diaphoresis, dizziness, PND, orthopnea or lower extremity edema.  No polyuria, polydipsia, polyphagia or blurred vision.  ED Course: Initial vital signs were Temperature 100.3 F, pulse 94, respirations 16, blood pressure 133/66 mmHg and O2 sat 97% on room air.  She received a 500 mL NS bolus, hydromorphone, oral and IV potassium  replacement.  Her urinalysis showed increase in a specific gravity, glucosuria of 100 mg/dL, moderate hemoglobinuria, small bilirubin, trace ketones and proteinuria 30 mg/dL.  White count was 8.9, hemoglobin 11.7 g/dL and platelets 261.  CMP shows a potassium of 2.6 mmol/L.  Glucose 127 and bilirubin 1.8 mg/dL.  Total protein was 8.3 g/dL.  The rest of the CMP values are normal.  Lipase, magnesium and phosphorus within expected range.  Microscopic examination showed rare bacteria, but was otherwise normal.  Total CK was mildly elevated at 357 units/L.  Imaging: CT abdomen pelvis with contrast showed tiny nonobstructive right lower pole renal stone and diverticulosis without diverticulitis.  Please see images and full radiology report for further detail.     Hospital Course:   Brief Summary 73 year old without significant underlying chronic medical problems admitted on 01/22/2019 with generalized weakness and abdominal pain and found to have electrolyte abnormalities, postop colonization patient was noted to have significant weakness gait problems and high fall risk, CKs were mildly elevated on admission, patient also found to have intermittent episodes of confusion or disorientation --Patient with very poor  insight, cognitive concerns,   A/p 1)Metabolic Encephalopathy--- patient with intermittent episodes of confusion and disorientation initially the setting of electrolyte abnormalities, which were replaced--- persistent significant weakness and gait problems noted, CT head  without intracranial pathology.  LFTs are not elevated except for bilirubin.  -Suspect underlying dementia , started on Aricept and Seroquel may use PRN lorazepam for restlessness  2)FEN/Hypokalemia--- replaced already, etiology unclear, magnesium and phosphorus were normal  3)Abdominal pain---- resolved at this time, CT abdomen and pelvis on admission on 01/22/2019 without acute findings, patient previously had a CT  abdomen and pelvis as well on 12/23/2018 that was also negative for acute findings , low clinical index of suspicion for diverticulitis ,  antibiotics discontinued after a couple of days ..  -Patient apparently had low-grade fevers on admission currently afebrile   4)Hyperbilirubinemia--- CT abdomen and pelvis on admission on 01/22/2019 without acute findings, patient previously had a CT abdomen and pelvis as well on 12/23/2018 that was also negative for acute findings.    5)Mild Normocytic anemia--- folic acid and B12 are low normal, low serum iron noted, low iron saturation noted, ferritin is not low, hemoglobin stable   No indication for further inpatient work-up at this time -Give IM B12 shots once a month especially given concerns about dementia, continue iron supplementation  6)Generalized weakness--Physical therapy eval appreciated, CT head without acute findings, needs SNF rehab  7)Disposition--- PTA patient lived alone, admitted with generalized weakness elevated CPK unsteady gait low-grade fevers, fall risk and electrolyte abnormalities----physical therapy recommends SNF rehab.... Unsafe to discharge home at this time... Given confusional episodes and gait issues very high risk for self injury  8)Dementia--- as per patient's sister patient has been having cognitive decline over the last 4 years to the point where at work she was having difficulty making salads so she was told to quit cooking food and focus on cleaning the kitchen... She apparently has become increasingly more forgetful with memory deficits and cognitive deficits requiring more help with executive decision-making over the last 4 years particularly over the last 6 months -- RPR is nonreactive and TSH is 2.3, CT head without acute findings, c/n Aricept, B12 borderline low, received IM B12 shot on 01/25/2019, next dose 02/25/2019  Code Status : Full  Family Communication:   Discussed with  sister Ms Tomma Lightning  [phone]  Disposition Plan  : SNF Rehab   Discharge Condition: stable Follow UP  Diet and Activity recommendation:  As advised  Discharge Instructions    Discharge Instructions    Call MD for:  difficulty breathing, headache or visual disturbances   Complete by: As directed    Call MD for:  difficulty breathing, headache or visual disturbances   Complete by: As directed    Call MD for:  persistant dizziness or light-headedness   Complete by: As directed    Call MD for:  persistant dizziness or light-headedness   Complete by: As directed    Call MD for:  persistant nausea and vomiting   Complete by: As directed    Call MD for:  persistant nausea and vomiting   Complete by: As directed    Call MD for:  severe uncontrolled pain   Complete by: As directed    Call MD for:  temperature >100.4   Complete by: As directed    Call MD for:  temperature >100.4   Complete by: As directed    Diet - low sodium heart healthy   Complete by: As directed  Diet - low sodium heart healthy   Complete by: As directed    Discharge instructions   Complete by: As directed    1)Call or return if symptoms persist or worsen 2)Follow- up with PCP for BMP and Serum Magnesium Recheck within 1 week   Discharge instructions   Complete by: As directed    1) fall precautions 2) vitamin B 12 injections monthly as ordered--next dose due date 07/05/2019   Increase activity slowly   Complete by: As directed    Increase activity slowly   Complete by: As directed        Discharge Medications     Allergies as of 01/27/2019   No Known Allergies     Medication List    STOP taking these medications   Alka-Seltzer Plus Cold & Cough 11-14-08-325 MG Caps Generic drug: Phenyleph-CPM-DM-APAP   magnesium citrate Soln     TAKE these medications   acetaminophen 325 MG tablet Commonly known as: TYLENOL Take 2 tablets (650 mg total) by mouth every 6 (six) hours as needed for mild pain, fever or  headache (or Fever >/= 101). What changed:   medication strength  how much to take  reasons to take this   cyanocobalamin 1000 MCG/ML injection Commonly known as: (VITAMIN B-12) Inject 1 mL (1,000 mcg total) into the muscle every 30 (thirty) days. Start taking on: February 25, 2019   donepezil 5 MG tablet Commonly known as: ARICEPT Take 1 tablet (5 mg total) by mouth at bedtime.   ferrous sulfate 325 (65 FE) MG tablet Take 1 tablet (325 mg total) by mouth daily with breakfast. Start taking on: January 28, 2019   LORazepam 0.5 MG tablet Commonly known as: Ativan Take 1 tablet (0.5 mg total) by mouth every 12 (twelve) hours as needed for anxiety or sleep.   ondansetron 4 MG tablet Commonly known as: Zofran Take 1 tablet (4 mg total) by mouth every 8 (eight) hours as needed for nausea or vomiting.   QUEtiapine 25 MG tablet Commonly known as: SEROQUEL Take 1 tablet (25 mg total) by mouth at bedtime.   senna-docusate 8.6-50 MG tablet Commonly known as: Senokot-S Take 2 tablets by mouth at bedtime.   simethicone 80 MG chewable tablet Commonly known as: MYLICON Chew 1 tablet (80 mg total) by mouth every 6 (six) hours as needed for flatulence.       Major procedures and Radiology Reports - PLEASE review detailed and final reports for all details, in brief -   Ct Head Wo Contrast  Result Date: 01/23/2019 CLINICAL DATA:  Metabolic encephalopathy. EXAM: CT HEAD WITHOUT CONTRAST TECHNIQUE: Contiguous axial images were obtained from the base of the skull through the vertex without intravenous contrast. COMPARISON:  None. FINDINGS: Brain: No evidence of acute infarction, hemorrhage, hydrocephalus, extra-axial collection or mass lesion/mass effect. Vascular: No hyperdense vessel or unexpected calcification. Skull: Normal. Negative for fracture or focal lesion. Sinuses/Orbits: No acute finding. Other: None. IMPRESSION: No acute intracranial abnormalities identified. Electronically Signed    By: Gerome Samavid  Williams III M.D   On: 01/23/2019 18:30   Ct Abdomen Pelvis W Contrast  Result Date: 01/22/2019 CLINICAL DATA:  Lower abdominal pain for 2 weeks EXAM: CT ABDOMEN AND PELVIS WITH CONTRAST TECHNIQUE: Multidetector CT imaging of the abdomen and pelvis was performed using the standard protocol following bolus administration of intravenous contrast. CONTRAST:  100mL OMNIPAQUE IOHEXOL 300 MG/ML  SOLN COMPARISON:  12/23/2018 FINDINGS: Lower chest: Lung bases are well aerated with some minimal scarring  bilaterally. Hepatobiliary: No focal liver abnormality is seen. No gallstones, gallbladder wall thickening, or biliary dilatation. Pancreas: Unremarkable. No pancreatic ductal dilatation or surrounding inflammatory changes. Spleen: Normal in size without focal abnormality. Adrenals/Urinary Tract: Adrenal glands are within normal limits. Kidneys demonstrate a normal enhancement pattern bilaterally. Tiny nonobstructing right lower pole renal stone is noted. Normal excretion of contrast is noted on delayed images. The bladder is well distended. Stomach/Bowel: Diverticular change of the colon is noted without evidence of diverticulitis. No obstructive or inflammatory changes are seen. The appendix is air-filled and within normal limits. No small bowel abnormality is noted. Stomach is unremarkable with the exception of a small sliding-type hiatal hernia. Vascular/Lymphatic: Aortic atherosclerosis. No enlarged abdominal or pelvic lymph nodes. Reproductive: Uterus and bilateral adnexa are unremarkable. Other: No abdominal wall hernia or abnormality. No abdominopelvic ascites. Musculoskeletal: Chronic anterolisthesis of L5 on S1 is seen. Disc space narrowing at L4-5 is noted. IMPRESSION: Tiny nonobstructing right lower pole renal stone. Diverticulosis without diverticulitis. No acute abnormality noted. Electronically Signed   By: Alcide Clever M.D.   On: 01/22/2019 21:35    Micro Results    Recent Results (from  the past 240 hour(s))  SARS Coronavirus 2 (CEPHEID- Performed in [facility] hospital lab), Hosp Order     Status: None   Collection Time: 01/22/19 10:21 PM   Specimen: Nasopharyngeal Swab  Result Value Ref Range Status   SARS Coronavirus 2 NEGATIVE NEGATIVE Final    Comment: (NOTE) If result is NEGATIVE SARS-CoV-2 target nucleic acids are NOT DETECTED. The SARS-CoV-2 RNA is generally detectable in upper and lower  respiratory specimens during the acute phase of infection. The lowest  concentration of SARS-CoV-2 viral copies this assay can detect is 250  copies / mL. A negative result does not preclude SARS-CoV-2 infection  and should not be used as the sole basis for treatment or other  patient management decisions.  A negative result may occur with  improper specimen collection / handling, submission of specimen other  than nasopharyngeal swab, presence of viral mutation(s) within the  areas targeted by this assay, and inadequate number of viral copies  (<250 copies / mL). A negative result must be combined with clinical  observations, patient history, and epidemiological information. If result is POSITIVE SARS-CoV-2 target nucleic acids are DETECTED. The SARS-CoV-2 RNA is generally detectable in upper and lower  respiratory specimens dur ing the acute phase of infection.  Positive  results are indicative of active infection with SARS-CoV-2.  Clinical  correlation with patient history and other diagnostic information is  necessary to determine patient infection status.  Positive results do  not rule out bacterial infection or co-infection with other viruses. If result is PRESUMPTIVE POSTIVE SARS-CoV-2 nucleic acids MAY BE PRESENT.   A presumptive positive result was obtained on the submitted specimen  and confirmed on repeat testing.  While 2019 novel coronavirus  (SARS-CoV-2) nucleic acids may be present in the submitted sample  additional confirmatory testing may be necessary  for epidemiological  and / or clinical management purposes  to differentiate between  SARS-CoV-2 and other Sarbecovirus currently known to infect humans.  If clinically indicated additional testing with an alternate test  methodology [phone]) is advised. The SARS-CoV-2 RNA is generally  detectable in upper and lower respiratory sp ecimens during the acute  phase of infection. The expected result is Negative. Fact Sheet for Patients:  BoilerBrush.com.cy Fact Sheet for Healthcare Providers: https://pope.com/ This test is not yet approved or cleared by  the [employer] [employer] FDA and has been authorized for detection and/or diagnosis of SARS-CoV-2 by FDA under an Emergency Use Authorization (EUA).  This EUA will remain in effect (meaning this test can be used) for the duration of the COVID-19 declaration under Section 564(b)(1) of the Act, 21 U.S.C. section 360bbb-3(b)(1), unless the authorization is terminated or revoked sooner. Performed at [facility], [address] [place], [place] [address]        Today   Subjective    Wendy Mccann today has no new complaints...  Pleasantly confused from time to time          Patient has been seen and examined prior to discharge   Objective   Blood pressure 121/64, pulse 63, temperature 98.5 F (36.9 C), temperature source Oral, resp. rate 16, height 5\' 1"  (1.549 m), weight 62.8 kg, SpO2 100 %.   Intake/Output Summary (Last 24 hours) at 01/27/2019 1201 Last data filed at 01/26/2019 1756 Gross per 24 hour  Intake 480 ml  Output --  Net 480 ml    Exam Gen:- Awake Alert, in no apparent distress  HEENT:- [place].AT, No sclera icterus Neck-Supple Neck,No JVD,.  Lungs-  CTAB , fair symmetrical air movement CV- S1, S2 normal, regular  Abd-  +ve B.Sounds, Abd Soft, No tenderness,    Extremity/Skin:- No  edema, pedal pulses present  Psych-patient continues to have intermittent episodes of  confusion and disorientation  neuro-generalized weakness, unsteady gait , no tremors   Data Review   CBC w Diff:  Lab Results  Component Value Date   WBC 10.2 01/23/2019   HGB 10.5 (L) 01/23/2019   HCT 32.7 (L) 01/23/2019   PLT 241 01/23/2019   LYMPHOPCT 11 01/22/2019   MONOPCT 16 01/22/2019   EOSPCT 0 01/22/2019   BASOPCT 0 01/22/2019    CMP:  Lab Results  Component Value Date   NA 139 01/23/2019   K 3.3 (L) 01/23/2019   CL 105 01/23/2019   CO2 26 01/23/2019   BUN 9 01/23/2019   CREATININE 0.66 01/23/2019   PROT 6.8 01/23/2019   ALBUMIN 3.0 (L) 01/23/2019   BILITOT 1.4 (H) 01/23/2019   ALKPHOS 61 01/23/2019   AST 18 01/23/2019   ALT 12 01/23/2019  .   Total Discharge time is about 33 minutes  Shon Haleourage Chayna Surratt M.D on 01/27/2019 at 12:01 PM  Go to [email or web address] -  for contact info  [facility] - Office  [phone]

## 2019-01-27 NOTE — TOC Transition Note (Signed)
Transition of Care [facility]) - CM/SW Discharge Note   Patient Details  Name: GAIGE FUSSNER MRN: [id number] Date of Birth: Mar 06, 1946  Transition of Care [facility]) CM/SW Contact:  Lashelle Koy, Chauncey Reading, RN Phone Number: 01/27/2019, 11:43 AM   Clinical Narrative:   Patient will DC to [facility] today. Will send DC clinicals when available. Family updated.     Final next level of care: Skilled Nursing Facility Barriers to Discharge: Continued Medical Work up   Patient Goals and CMS Choice Patient states their goals for this hospitalization and ongoing recovery are:: Return home CMS Medicare.gov Compare Post Acute Care list provided to:: Patient Choice offered to / list presented to : Patient  Discharge Placement              Patient chooses bed at: [facility] Patient to be transferred to facility by: [facility] staff Name of family member notified: Daiva Nakayama [phone]  [phone] Patient and family notified of of transfer: 01/27/19  Discharge Plan and Services In-house Referral: Clinical Social Work                     No flowsheet data found.

## 2019-01-27 NOTE — Patient Outreach (Addendum)
[facility]) Care Management  01/27/2019  Wendy Mccann [birth year] [id number]  Transition of Care/ Case closure  Received referral for transtion of care assessment for this [employer] employee. 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   Objective: Wendy Mccann was admitted to [facility] on 7/9 for abdominal pain, hypokalemia, generalized weakness and high fall risk, anemia, volume depletion, hyperbilirubinemia and intermittent periods of confusion. She was discharged to [facility] on 01/27/19.  Plan: No transition of care assessment indicated at this time due to skilled nursing facility placement.  Will plan to contact patient within 72 hours of discharge notification from [facility] to complete transition of care assessment.  Barrington Ellison RN,CCM,CDE [facility] Management Coordinator Office Phone [phone] Office Fax [phone]

## 2019-01-27 NOTE — Discharge Instructions (Signed)
°  1) fall precautions 2) vitamin B 12 injections monthly as ordered--next dose due date 07/05/2019

## 2019-01-28 ENCOUNTER — Encounter: Payer: Self-pay | Admitting: Adult Health

## 2019-01-28 ENCOUNTER — Other Ambulatory Visit: Payer: Self-pay | Admitting: Adult Health

## 2019-01-28 ENCOUNTER — Non-Acute Institutional Stay (SKILLED_NURSING_FACILITY): Payer: 59 | Admitting: Adult Health

## 2019-01-28 DIAGNOSIS — D649 Anemia, unspecified: Secondary | ICD-10-CM

## 2019-01-28 DIAGNOSIS — R531 Weakness: Secondary | ICD-10-CM

## 2019-01-28 DIAGNOSIS — G9341 Metabolic encephalopathy: Secondary | ICD-10-CM

## 2019-01-28 MED ORDER — QUETIAPINE FUMARATE 25 MG PO TABS: 25.0000 mg | ORAL_TABLET | Freq: Every day | ORAL | 0 refills | Status: DC

## 2019-01-28 MED ORDER — LORAZEPAM 0.5 MG PO TABS: 0.5000 mg | ORAL_TABLET | Freq: Two times a day (BID) | ORAL | 0 refills | Status: DC | PRN

## 2019-01-28 MED ORDER — DONEPEZIL HCL 5 MG PO TABS: 5.0000 mg | ORAL_TABLET | Freq: Every day | ORAL | 0 refills | Status: DC

## 2019-01-28 MED ORDER — ONDANSETRON HCL 4 MG PO TABS: 4.0000 mg | ORAL_TABLET | Freq: Three times a day (TID) | ORAL | 0 refills | Status: DC | PRN

## 2019-01-28 MED ORDER — FERROUS SULFATE 325 (65 FE) MG PO TABS: 325.0000 mg | ORAL_TABLET | Freq: Every day | ORAL | 0 refills | Status: AC

## 2019-01-28 NOTE — Progress Notes (Signed)
Location:   [place] Room Number: 159 P Place of Service:  SNF (31)    CODE STATUS: Full Code  No Known Allergies  Chief Complaint  Patient presents with  . Discharge Note    Discharging to home on 01/28/2019    HPI:  She is being discharged to home with home health for pt/ot/rn/sw. She will not need any dme; she will need her prescriptions written and will need to follow up with her medical provider  She cam her last PM after being hospitalized from 01-22-19 through 01-27-19 for acute metabolic encephalopathy. She is wanting to go home. She is physically independent. She is confused (which is long term) ; per her family she will not allow any one in her home to help her. She does live independently. She is an employ of [employer].    Past Medical History:  Diagnosis Date  . Medical history non-contributory   . Rotator cuff (capsule) sprain     Past Surgical History:  Procedure Laterality Date  . ankles      Social History   Socioeconomic History  . Marital status: Single    Spouse name: Not on file  . Number of children: Not on file  . Years of education: Not on file  . Highest education level: Not on file  Occupational History  . Not on file  Social Needs  . Financial resource strain: Not on file  . Food insecurity    Worry: Not on file    Inability: Not on file  . Transportation needs    Medical: Not on file    Non-medical: Not on file  Tobacco Use  . Smoking status: Never Smoker  . Smokeless tobacco: Never Used  Substance and Sexual Activity  . Alcohol use: No  . Drug use: No  . Sexual activity: Not on file  Lifestyle  . Physical activity    Days per week: Not on file    Minutes per session: Not on file  . Stress: Not on file  Relationships  . Social Herbalist on phone: Not on file    Gets together: Not on file    Attends religious service: Not on file    Active member of club or organization: Not on file     Attends meetings of clubs or organizations: Not on file    Relationship status: Not on file  . Intimate partner violence    Fear of current or ex partner: Not on file    Emotionally abused: Not on file    Physically abused: Not on file    Forced sexual activity: Not on file  Other Topics Concern  . Not on file  Social History Narrative  . Not on file   Family History  Problem Relation Age of Onset  . COPD Father     VITAL SIGNS BP 122/70   Pulse 100   Temp 97.9 F (36.6 C)   Resp 20   Ht 5\' 1"  (1.549 m)   Wt 133 lb 9.6 oz (60.6 kg)   BMI 25.24 kg/m   Patient's Medications  New Prescriptions   No medications on file  Previous Medications   ACETAMINOPHEN (TYLENOL) 325 MG TABLET    Take 2 tablets (650 mg total) by mouth every 6 (six) hours as needed for mild pain, fever or headache (or Fever >/= 101).   CYANOCOBALAMIN (,VITAMIN B-12,) 1000 MCG/ML INJECTION    Inject 1 mL (  1,000 mcg total) into the muscle every 30 (thirty) days.   NON FORMULARY    Diet Type - Regular   SENNA-DOCUSATE (SENOKOT-S) 8.6-50 MG TABLET    Take 2 tablets by mouth at bedtime.   SIMETHICONE (MYLICON) 80 MG CHEWABLE TABLET    Chew 1 tablet (80 mg total) by mouth every 6 (six) hours as needed for flatulence.  Modified Medications   Modified Medication Previous Medication   DONEPEZIL (ARICEPT) 5 MG TABLET donepezil (ARICEPT) 5 MG tablet      Take 1 tablet (5 mg total) by mouth at bedtime.    Take 1 tablet (5 mg total) by mouth at bedtime.   FERROUS SULFATE 325 (65 FE) MG TABLET ferrous sulfate 325 (65 FE) MG tablet      Take 1 tablet (325 mg total) by mouth daily with breakfast.    Take 1 tablet (325 mg total) by mouth daily with breakfast.   LORAZEPAM (ATIVAN) 0.5 MG TABLET LORazepam (ATIVAN) 0.5 MG tablet      Take 1 tablet (0.5 mg total) by mouth every 12 (twelve) hours as needed for anxiety or sleep.    Take 1 tablet (0.5 mg total) by mouth every 12 (twelve) hours as needed for anxiety or sleep.    ONDANSETRON (ZOFRAN) 4 MG TABLET ondansetron (ZOFRAN) 4 MG tablet      Take 1 tablet (4 mg total) by mouth every 8 (eight) hours as needed for nausea or vomiting.    Take 1 tablet (4 mg total) by mouth every 8 (eight) hours as needed for nausea or vomiting.   QUETIAPINE (SEROQUEL) 25 MG TABLET QUEtiapine (SEROQUEL) 25 MG tablet      Take 1 tablet (25 mg total) by mouth at bedtime.    Take 1 tablet (25 mg total) by mouth at bedtime.  Discontinued Medications   No medications on file     SIGNIFICANT DIAGNOSTIC EXAMS  TODAY  01-22-19: ct of abdomen and pelvis:Tiny nonobstructing right lower pole renal stone. Diverticulosis without diverticulitis. No acute abnormality noted.   01-23-19: ct of head: No acute intracranial abnormalities identified.  LABS REVIEWED TODAY;   01-23-19: wbc 10.2; hgb 10.5; hct 32.7; mcv 99.1; plt 241; glucose 112; bun 9; create 0.66; k+ 3.3; an++ 139; ca 8.8; total bili 1.4 albumin 3.0; vit B 12: 189 folate 6.2; iron 10; tibc 267; ferritin 214; tsh 2.344   Review of Systems  Constitutional: Negative for malaise/fatigue.  Respiratory: Negative for cough and shortness of breath.   Cardiovascular: Negative for chest pain, palpitations and leg swelling.  Gastrointestinal: Negative for abdominal pain, constipation and heartburn.  Musculoskeletal: Negative for back pain, joint pain and myalgias.  Skin: Negative.   Neurological: Negative for dizziness.  Psychiatric/Behavioral: The patient is not nervous/anxious.      Physical Exam Constitutional:      General: She is not in acute distress.    Appearance: She is well-developed. She is not diaphoretic.  Neck:     Musculoskeletal: Neck supple.     Thyroid: No thyromegaly.  Cardiovascular:     Rate and Rhythm: Normal rate and regular rhythm.     Pulses: Normal pulses.     Heart sounds: Normal heart sounds.  Pulmonary:     Effort: Pulmonary effort is normal. No respiratory distress.     Breath sounds: Normal  breath sounds.  Abdominal:     General: Bowel sounds are normal. There is no distension.     Palpations: Abdomen is soft.  Tenderness: There is no abdominal tenderness.  Musculoskeletal: Normal range of motion.     Right lower leg: No edema.     Left lower leg: No edema.  Lymphadenopathy:     Cervical: No cervical adenopathy.  Skin:    General: Skin is warm and dry.  Neurological:     Mental Status: She is alert and oriented to person, place, and time.  Psychiatric:        Mood and Affect: Mood normal.       ASSESSMENT/ PLAN:  Patient is being discharged with the following home health services:  Pt/ot/rn/sw: to evaluate and treat as indicated for gait balance strength adl training medication management; community resources.   Patient is being discharged with the following durable medical equipment:  None needed  Patient has been advised to f/u with their PCP in 1-2 weeks to bring them up to date on their rehab stay.  Social services at facility was responsible for arranging this appointment.  Pt was provided with a 30 day supply of prescriptions for medications and refills must be obtained from their PCP.  For controlled substances, a more limited supply may be provided adequate until PCP appointment only.  A 30 day supply of her medications have been sent to [facility] with #10 ativan 0.5 mg tabs  Time spent with family and patient: 45 minutes: discussed home health needs; supervision desired for her; medications.    Synthia Innocent NP [facility]  Contact [phone] Monday through Friday 8am- 5pm  After hours call [phone]

## 2019-01-29 DIAGNOSIS — G9341 Metabolic encephalopathy: Secondary | ICD-10-CM | POA: Insufficient documentation

## 2019-02-05 ENCOUNTER — Encounter (HOSPITAL_COMMUNITY): Payer: Self-pay | Admitting: Emergency Medicine

## 2019-02-05 ENCOUNTER — Emergency Department (HOSPITAL_COMMUNITY): Payer: 59

## 2019-02-05 ENCOUNTER — Other Ambulatory Visit: Payer: Self-pay

## 2019-02-05 ENCOUNTER — Inpatient Hospital Stay (HOSPITAL_COMMUNITY)
Admission: EM | Admit: 2019-02-05 | Discharge: 2019-02-09 | DRG: 554 | Disposition: A | Discharge status: Home Health | Payer: 59 | Attending: Family Medicine | Admitting: Family Medicine

## 2019-02-05 DIAGNOSIS — M79641 Pain in right hand: Secondary | ICD-10-CM | POA: Diagnosis not present

## 2019-02-05 DIAGNOSIS — F0390 Unspecified dementia without behavioral disturbance: Secondary | ICD-10-CM | POA: Diagnosis present

## 2019-02-05 DIAGNOSIS — E876 Hypokalemia: Secondary | ICD-10-CM | POA: Diagnosis present

## 2019-02-05 DIAGNOSIS — Z825 Family history of asthma and other chronic lower respiratory diseases: Secondary | ICD-10-CM

## 2019-02-05 DIAGNOSIS — Z20828 Contact with and (suspected) exposure to other viral communicable diseases: Secondary | ICD-10-CM | POA: Diagnosis present

## 2019-02-05 DIAGNOSIS — M2041 Other hammer toe(s) (acquired), right foot: Secondary | ICD-10-CM | POA: Diagnosis not present

## 2019-02-05 DIAGNOSIS — R441 Visual hallucinations: Secondary | ICD-10-CM | POA: Diagnosis not present

## 2019-02-05 DIAGNOSIS — M7731 Calcaneal spur, right foot: Secondary | ICD-10-CM | POA: Diagnosis not present

## 2019-02-05 DIAGNOSIS — G309 Alzheimer's disease, unspecified: Secondary | ICD-10-CM | POA: Diagnosis present

## 2019-02-05 DIAGNOSIS — M1990 Unspecified osteoarthritis, unspecified site: Secondary | ICD-10-CM | POA: Diagnosis not present

## 2019-02-05 DIAGNOSIS — F0280 Dementia in other diseases classified elsewhere without behavioral disturbance: Secondary | ICD-10-CM | POA: Diagnosis not present

## 2019-02-05 DIAGNOSIS — M25421 Effusion, right elbow: Secondary | ICD-10-CM

## 2019-02-05 DIAGNOSIS — M79601 Pain in right arm: Secondary | ICD-10-CM | POA: Diagnosis not present

## 2019-02-05 DIAGNOSIS — M19071 Primary osteoarthritis, right ankle and foot: Secondary | ICD-10-CM | POA: Diagnosis not present

## 2019-02-05 DIAGNOSIS — M064 Inflammatory polyarthropathy: Secondary | ICD-10-CM | POA: Diagnosis not present

## 2019-02-05 DIAGNOSIS — E538 Deficiency of other specified B group vitamins: Secondary | ICD-10-CM | POA: Diagnosis not present

## 2019-02-05 DIAGNOSIS — D649 Anemia, unspecified: Secondary | ICD-10-CM | POA: Diagnosis not present

## 2019-02-05 DIAGNOSIS — R531 Weakness: Secondary | ICD-10-CM

## 2019-02-05 DIAGNOSIS — R600 Localized edema: Secondary | ICD-10-CM | POA: Diagnosis not present

## 2019-02-05 DIAGNOSIS — Z03818 Encounter for observation for suspected exposure to other biological agents ruled out: Secondary | ICD-10-CM | POA: Diagnosis not present

## 2019-02-05 DIAGNOSIS — R0989 Other specified symptoms and signs involving the circulatory and respiratory systems: Secondary | ICD-10-CM | POA: Diagnosis not present

## 2019-02-05 HISTORY — DX: Dementia in other diseases classified elsewhere, unspecified severity, without behavioral disturbance, psychotic disturbance, mood disturbance, and anxiety: F02.80

## 2019-02-05 HISTORY — DX: Anemia, unspecified: D64.9

## 2019-02-05 LAB — COMPREHENSIVE METABOLIC PANEL
ALT: 10 U/L (ref 0–44)
AST: 16 U/L (ref 15–41)
Albumin: 3.4 g/dL — ABNORMAL LOW (ref 3.5–5.0)
Alkaline Phosphatase: 66 U/L (ref 38–126)
Anion gap: 12 (ref 5–15)
BUN: 9 mg/dL (ref 8–23)
CO2: 25 mmol/L (ref 22–32)
Calcium: 9.3 mg/dL (ref 8.9–10.3)
Chloride: 98 mmol/L (ref 98–111)
Creatinine, Ser: 0.76 mg/dL (ref 0.44–1.00)
GFR calc Af Amer: >60 mL/min (ref >60)
GFR calc non Af Amer: >60 mL/min (ref >60)
Glucose, Bld: 109 mg/dL — ABNORMAL HIGH (ref 70–99)
Potassium: 2.7 mmol/L — CL (ref 3.5–5.1)
Sodium: 135 mmol/L (ref 135–145)
Total Bilirubin: 1.4 mg/dL — ABNORMAL HIGH (ref 0.3–1.2)
Total Protein: 7.9 g/dL (ref 6.5–8.1)

## 2019-02-05 LAB — CBC WITH DIFFERENTIAL/PLATELET
Abs Immature Granulocytes: 0.06 10*3/uL (ref 0.00–0.07)
Basophils Absolute: 0 10*3/uL (ref 0.0–0.1)
Basophils Relative: 0 %
Eosinophils Absolute: 0 10*3/uL (ref 0.0–0.5)
Eosinophils Relative: 0 %
HCT: 32.1 % — ABNORMAL LOW (ref 36.0–46.0)
Hemoglobin: 10.6 g/dL — ABNORMAL LOW (ref 12.0–15.0)
Immature Granulocytes: 1 %
Lymphocytes Relative: 16 %
Lymphs Abs: 1.8 10*3/uL (ref 0.7–4.0)
MCH: 31.6 pg (ref 26.0–34.0)
MCHC: 33 g/dL (ref 30.0–36.0)
MCV: 95.8 fL (ref 80.0–100.0)
Monocytes Absolute: 1.9 10*3/uL — ABNORMAL HIGH (ref 0.1–1.0)
Monocytes Relative: 17 %
Neutro Abs: 7.4 10*3/uL (ref 1.7–7.7)
Neutrophils Relative %: 66 %
Platelets: 317 10*3/uL (ref 150–400)
RBC: 3.35 MIL/uL — ABNORMAL LOW (ref 3.87–5.11)
RDW: 12.2 % (ref 11.5–15.5)
WBC: 11.1 10*3/uL — ABNORMAL HIGH (ref 4.0–10.5)
nRBC: 0 % (ref 0.0–0.2)

## 2019-02-05 LAB — GRAM STAIN

## 2019-02-05 LAB — TROPONIN I (HIGH SENSITIVITY): Troponin I (High Sensitivity): 3 ng/L (ref <18)

## 2019-02-05 MED ORDER — MAGNESIUM SULFATE IN D5W 1-5 GM/100ML-% IV SOLN
1.0000 g | Freq: Once | INTRAVENOUS | Status: AC
  Administered 2019-02-06: 1 g via INTRAVENOUS
  Filled 2019-02-05: qty 100

## 2019-02-05 MED ORDER — POTASSIUM CHLORIDE 10 MEQ/100ML IV SOLN
10.0000 meq | INTRAVENOUS | Status: AC
  Administered 2019-02-05 – 2019-02-06 (×4): 10 meq via INTRAVENOUS
  Filled 2019-02-05 (×4): qty 100

## 2019-02-05 MED ORDER — LIDOCAINE HCL (PF) 1 % IJ SOLN
10.0000 mL | Freq: Once | INTRAMUSCULAR | Status: AC
  Administered 2019-02-05: 10 mL
  Filled 2019-02-05: qty 30

## 2019-02-05 NOTE — ED Notes (Addendum)
CRITICAL VALUE ALERT  Critical Value:  Potassium 2.7  Date & Time Notied:  2045 02/05/19  Provider Notified: venter, PA  Orders Received/Actions taken: md notified

## 2019-02-05 NOTE — ED Provider Notes (Signed)
The patient is a pleasant 73 year old female who has struggled with some mild dementia, has had the onset of hallucinations in the last week or so, she states that while she was at the [facility] in rehab after a recent admission to the hospital she was starting to see some people who she states look like [place] women, 1 of them has half left foot, she feels like they are doing strange things such as opening her fridge and taking the food out, then they go away and she does not see them for a while.  When she called her family to tell about them today she was brought to the hospital for evaluation for the hallucinations.  She does not have any pain nausea vomiting headache cough shortness of breath chills or any other complaints.  And on exam the patient is well-appearing without any acute findings.  The patient is not having any hallucinations at this time, she appears well.  She is stable  .Joint Aspiration/Arthrocentesis  Date/Time: 02/05/2019 9:52 PM Performed by: Noemi Chapel, MD Authorized by: Noemi Chapel, MD   Consent:    Consent obtained:  Written and verbal   Consent given by:  Patient   Risks discussed:  Bleeding, infection, pain, incomplete drainage and nerve damage   Alternatives discussed:  No treatment Location:    Location:  Elbow   Elbow:  R elbow Anesthesia (see MAR for exact dosages):    Anesthesia method:  Local infiltration   Local anesthetic:  Lidocaine 1% w/o epi Procedure details:    Preparation: Patient was prepped and draped in usual sterile fashion     Needle gauge:  18 G   Ultrasound guidance: no     Approach:  Lateral   Aspirate amount:  2   Aspirate characteristics:  Blood-tinged, yellow and cloudy   Steroid injected: no     Specimen collected: yes   Post-procedure details:    Dressing:  Sterile dressing   Patient tolerance of procedure:  Tolerated well, no immediate complications Comments:         I discussed care with Dr. Darrick Meigs of the hospitalist  service will admit the patient to the hospital.  She has been given potassium, magnesium, fluid is pending.  No organisms seen but no blood counts or cell counts in the fluid as of yet at the change of shift.  Medical screening examination/treatment/procedure(s) were conducted as a shared visit with non-physician practitioner(s) and myself.  I personally evaluated the patient during the encounter.  Clinical Impression:   Final diagnoses:  Hypokalemia  Visual hallucinations  Effusion of right elbow         Noemi Chapel, MD 02/07/19 1341

## 2019-02-05 NOTE — ED Provider Notes (Signed)
Wendy Mccann Provider Note   CSN: [id number] Arrival date & time: 02/05/19  1704    History   Chief Complaint Chief Complaint  Patient presents with   Hallucinations    HPI Wendy Mccann is a 73 y.o. female with PMHx dementia and anemia who presents to the ED today complaining of visual hallucinations that began a couple of days ago. Pt reports that she was recently discharged from the hospital after having abdominal pain. She states she has been doing well in terms of that but recently started seeing individuals in her home that aren't supposed to be there. She states they never talk to her directly but they move around the house and go into pt's room and drawers. They also took all of pt's food out of the freezer and put their own food in there. Pt states she lives alone and her sister Stanton Kidney or her brother Audry Pili comes to see her at nighttime to help with her medications. When Stanton Kidney came over today pt told her about the individuals in her home and her sister brought her to the ED for further eval.   Pt currently complaining of right ankle pain and right thumb pain x 1-2 days. She reports she broke her ankle "a couple of years ago" but no known re-injury. She cannot say if she has taken anything for the pain.      The history is provided by the patient, a relative and medical records.    Past Medical History:  Diagnosis Date   Alzheimer's dementia ([place])    Anemia    Medical history non-contributory    Rotator cuff (capsule) sprain     Patient Active Problem List   Diagnosis Date Noted   Acute metabolic encephalopathy 65/78/4696   Normocytic anemia 01/23/2019   Hypokalemia 01/22/2019   Volume depletion 01/22/2019   Hyperbilirubinemia 01/22/2019   Generalized weakness 08/12/2016   Orthostatic hypotension 08/12/2016   Abdominal pain 08/12/2016   Cough 08/12/2016   Dehydration, mild 08/12/2016    Past Surgical History:  Procedure  Laterality Date   ankles       OB History    Gravida  0   Para  0   Term  0   Preterm  0   AB  0   Living  0     SAB  0   TAB  0   Ectopic  0   Multiple  0   Live Births  0            Home Medications    Prior to Admission medications   Medication Sig Start Date End Date Taking? Authorizing Provider  acetaminophen (TYLENOL) 325 MG tablet Take 2 tablets (650 mg total) by mouth every 6 (six) hours as needed for mild pain, fever or headache (or Fever >/= 101). 01/23/19  Yes Emokpae, Courage, MD  cyanocobalamin (,VITAMIN B-12,) 1000 MCG/ML injection Inject 1 mL (1,000 mcg total) into the muscle every 30 (thirty) days. 02/25/19  Yes Emokpae, Courage, MD  donepezil (ARICEPT) 5 MG tablet Take 1 tablet (5 mg total) by mouth at bedtime. 01/28/19  Yes Gerlene Fee, NP  ferrous sulfate 325 (65 FE) MG tablet Take 1 tablet (325 mg total) by mouth daily with breakfast. 01/28/19  Yes Gerlene Fee, NP  LORazepam (ATIVAN) 0.5 MG tablet Take 1 tablet (0.5 mg total) by mouth every 12 (twelve) hours as needed for anxiety or sleep. 01/28/19 01/28/20 Yes Gerlene Fee, NP  ondansetron (ZOFRAN) 4 MG tablet Take 1 tablet (4 mg total) by mouth every 8 (eight) hours as needed for nausea or vomiting. 01/28/19 01/28/20 Yes Sharee Holster, NP  potassium chloride SA (K-DUR) 20 MEQ tablet Take 20 mEq by mouth daily. For 3 days   Yes [provider]  QUEtiapine (SEROQUEL) 25 MG tablet Take 1 tablet (25 mg total) by mouth at bedtime. 01/28/19  Yes Sharee Holster, NP  senna-docusate (SENOKOT-S) 8.6-50 MG tablet Take 2 tablets by mouth at bedtime. 01/23/19 01/23/20 Yes Emokpae, Courage, MD  simethicone (MYLICON) 80 MG chewable tablet Chew 1 tablet (80 mg total) by mouth every 6 (six) hours as needed for flatulence. 12/23/18  Yes Jeanie Sewer, PA-C    Family History Family History  Problem Relation Age of Onset   COPD Father     Social History Social History   Tobacco Use    Smoking status: Never Smoker   Smokeless tobacco: Never Used  Substance Use Topics   Alcohol use: No   Drug use: No     Allergies   Patient has no known allergies.   Review of Systems Review of Systems  Unable to perform ROS: Dementia  Musculoskeletal: Positive for arthralgias.  Psychiatric/Behavioral: Positive for hallucinations.     Physical Exam Updated Vital Signs BP (!) 115/52    Pulse 88    Temp 98.4 F (36.9 C) (Oral)    Resp 16    Ht 5\' 1"  (1.549 m)    Wt 63.5 kg    SpO2 99%    BMI 26.45 kg/m   Physical Exam Vitals signs and nursing note reviewed.  Constitutional:      Appearance: She is not ill-appearing.  HENT:     Head: Normocephalic and atraumatic.     Comments: Negative hemotympanum bilaterally. No raccoon's sign or battle's sign.     Right Ear: Tympanic membrane normal.     Left Ear: Tympanic membrane normal.  Eyes:     Conjunctiva/sclera: Conjunctivae normal.  Neck:     Musculoskeletal: Neck supple.  Cardiovascular:     Rate and Rhythm: Normal rate and regular rhythm.     Pulses: Normal pulses.  Pulmonary:     Effort: Pulmonary effort is normal.     Breath sounds: Normal breath sounds. No wheezing, rhonchi or rales.  Abdominal:     Palpations: Abdomen is soft.     Tenderness: There is no abdominal tenderness. There is no guarding or rebound.  Musculoskeletal:     Comments: Mild swelling with erythema noted to right thumb with TTP. Pt also found to be tender to right shoulder as well as right elbow. No tenderness to humerus or radius/ulna. ROM limited due to pain. Strength 4/5 compared to left. Sensation intact throughout. Good distal pulses.   Right ankle also swollen with erythema and tenderness along medial malleolus; ROM limited due to pain; Strength decreased compared to left; Sensation intact throughout; 2+ DP and PT pulse  Skin:    General: Skin is warm and dry.  Neurological:     Mental Status: She is alert.     Comments: CN 3-12  grossly intact A&O x4 GCS 15 Sensation and strength intact Coordination with finger-to-nose WNL Neg pronator drift       ED Treatments / Results  Labs (all labs ordered are listed, but only abnormal results are displayed) Labs Reviewed  CBC WITH DIFFERENTIAL/PLATELET - Abnormal; Notable for the following components:      Result  Value   WBC 11.1 (*)    RBC 3.35 (*)    Hemoglobin 10.6 (*)    HCT 32.1 (*)    Monocytes Absolute 1.9 (*)    All other components within normal limits  COMPREHENSIVE METABOLIC PANEL - Abnormal; Notable for the following components:   Potassium 2.7 (*)    Glucose, Bld 109 (*)    Albumin 3.4 (*)    Total Bilirubin 1.4 (*)    All other components within normal limits  SARS CORONAVIRUS 2 (HOSPITAL ORDER, PERFORMED IN [facility])  BODY FLUID CULTURE  GRAM STAIN  URINALYSIS, ROUTINE W REFLEX MICROSCOPIC  GLUCOSE, BODY FLUID OTHER  PROTEIN, BODY FLUID (OTHER)  SYNOVIAL CELL COUNT + DIFF, W/ CRYSTALS  CBG MONITORING, ED  TROPONIN I (HIGH SENSITIVITY)    EKG EKG Interpretation  Date/Time:  Thursday February 05 2019 19:52:48 EDT Ventricular Rate:  89 PR Interval:    QRS Duration: 79 QT Interval:  349 QTC Calculation: 425 R Axis:   74 Text Interpretation:  Sinus rhythm Low voltage, precordial leads RSR' in V1 or V2, probably normal variant since last tracing no significant change Confirmed by Eber HongMiller, Brian ([id number]) on 02/05/2019 7:58:33 PM   Radiology Dg Chest 1 View  Result Date: 02/05/2019 CLINICAL DATA:  Acute onset of right arm pain and hallucinations. Evaluate for infection. EXAM: CHEST  1 VIEW COMPARISON:  Chest radiograph performed 08/12/2016 FINDINGS: The lungs are mildly hypoexpanded. Mild vascular congestion is noted. There is no evidence of focal opacification, pleural effusion or pneumothorax. The cardiomediastinal silhouette is within normal limits. No acute osseous abnormalities are seen. IMPRESSION: Lungs mildly  hypoexpanded but grossly clear. Mild vascular congestion noted. Electronically Signed   By: Roanna RaiderJeffery  Chang M.D.   On: 02/05/2019 21:03   Dg Elbow Complete Right  Result Date: 02/05/2019 CLINICAL DATA:  73 year old female with right arm pain. No known injury. Evaluate for possible infection. EXAM: RIGHT ELBOW - COMPLETE 3+ VIEW; RIGHT HUMERUS - 2+ VIEW COMPARISON:  Right shoulder radiograph dated 05/19/2018 FINDINGS: No definite acute fracture identified. Small bone fragments adjacent to the lateral epicondyle as well as at the tip of the coronoid process most likely chronic. There is however elevation of the anterior fat pad indicative of joint effusion. An occult fracture or an infectious process is not excluded. Clinical correlation is recommended. No periosteal elevation or bone erosion to suggest acute osteomyelitis by radiograph. The soft tissues are unremarkable. No radiopaque foreign object or soft tissue gas. IMPRESSION: 1. No definite acute fracture.  No dislocation. 2. Elevation of the anterior fat pad of the elbow indicative of joint effusion. Clinical correlation is recommended. Electronically Signed   By: Elgie CollardArash  Radparvar M.D.   On: 02/05/2019 21:09   Dg Ankle Complete Right  Result Date: 02/05/2019 CLINICAL DATA:  Right foot and ankle pain and swelling. EXAM: RIGHT ANKLE - COMPLETE 3+ VIEW COMPARISON:  None. FINDINGS: Single screw traverses the distal fibula. Advanced tibial talar joint space narrowing with osseous remodeling, subchondral cystic change and sclerosis. Mild bony fragmentation of the medial malleolus. No definite tibial talar joint effusion. There is a small plantar calcaneal spur. No bony destructive change. Moderate soft tissue edema about the ankle. No soft tissue air. No radiopaque foreign body. IMPRESSION: 1. Moderate soft tissue edema about the ankle. No soft tissue air or radiopaque foreign body. No radiographic findings of osteomyelitis. 2. Advanced tibial talar  osteoarthritis. Electronically Signed   By: Narda RutherfordMelanie  Sanford M.D.   On:  02/05/2019 21:06   Dg Humerus Right  Result Date: 02/05/2019 CLINICAL DATA:  73 year old female with right arm pain. No known injury. Evaluate for possible infection. EXAM: RIGHT ELBOW - COMPLETE 3+ VIEW; RIGHT HUMERUS - 2+ VIEW COMPARISON:  Right shoulder radiograph dated 05/19/2018 FINDINGS: No definite acute fracture identified. Small bone fragments adjacent to the lateral epicondyle as well as at the tip of the coronoid process most likely chronic. There is however elevation of the anterior fat pad indicative of joint effusion. An occult fracture or an infectious process is not excluded. Clinical correlation is recommended. No periosteal elevation or bone erosion to suggest acute osteomyelitis by radiograph. The soft tissues are unremarkable. No radiopaque foreign object or soft tissue gas. IMPRESSION: 1. No definite acute fracture.  No dislocation. 2. Elevation of the anterior fat pad of the elbow indicative of joint effusion. Clinical correlation is recommended. Electronically Signed   By: Elgie Collard M.D.   On: 02/05/2019 21:09   Dg Hand Complete Right  Result Date: 02/05/2019 CLINICAL DATA:  Acute onset of right hand pain, with thumb swelling and erythema. EXAM: RIGHT HAND - COMPLETE 3+ VIEW COMPARISON:  None. FINDINGS: There is no evidence of fracture or dislocation. Mild degenerative change is noted at the distal interphalangeal joints, concerning for mild osteoarthritis. The carpal rows are intact, and demonstrate normal alignment. Mild calcification is noted at the triangular fibrocartilage. Mild soft tissue swelling is noted about the thumb. The thumb is otherwise grossly unremarkable. IMPRESSION: 1. No evidence of fracture or dislocation. 2. Mild soft tissue swelling about the thumb; thumb otherwise unremarkable. 3. Mild degenerative change at the distal interphalangeal joints, concerning for mild osteoarthritis.  Electronically Signed   By: Roanna Raider M.D.   On: 02/05/2019 21:05   Dg Foot Complete Right  Result Date: 02/05/2019 CLINICAL DATA:  Right foot and ankle pain and swelling. EXAM: RIGHT FOOT COMPLETE - 3+ VIEW COMPARISON:  None. FINDINGS: There is no evidence of fracture or dislocation. Mild hammertoe deformity of the digits. Plantar calcaneal spur. No bony destructive change. Generalized soft tissue edema. No soft tissue air. No radiopaque foreign body. IMPRESSION: Generalized soft tissue edema. No acute osseous abnormality or findings of osteomyelitis. Electronically Signed   By: Narda Rutherford M.D.   On: 02/05/2019 21:08    Procedures Procedures (including critical care time)  Medications Ordered in ED Medications  potassium chloride 10 mEq in 100 mL IVPB (10 mEq Intravenous New Bag/Given 02/05/19 2101)  magnesium sulfate IVPB 1 g 100 mL (has no administration in time range)  lidocaine (PF) (XYLOCAINE) 1 % injection 10 mL (has no administration in time range)     Initial Impression / Assessment and Plan / ED Course  I have reviewed the triage vital signs and the nursing notes.  Pertinent labs & imaging results that were available during my care of the patient were reviewed by me and considered in my medical decision making (see chart for details).  Clinical Course as of Feb 05 2207  Thu Feb 05, 2019  2043 Potassium(!!): 2.7 [MV]    Clinical Course User Index [MV] Tanda Rockers, [place]   73 year old female presenting with visual hallucinations and joint pain to right ankle, right thumb, right shoulder, right elbow. Will obtain xrays of these; unable to say if pt fell in anyway. Right thumb and right ankle swollen with erythema and increased warmth; concern for cellulitis vs gout vs septic joints. Will reevaluate once imaging returns. Regarding hallucinations - pt  has hx of dementia. Recently admitted to the hospital with metabolic encephalopathy s/2 hypokalemia. She was started on  Aricept at that time but difficult to say if pt has been taking it. Family members report they have found "small orange pills" around pt's home as of late. Pt appears to be alert and oriented during time of exam. No focal neuro deficits appreciated. No signs of head trauma. Had CT Head done while in the hospital last week without acute findings. RPR and TSH within normal limits as well. Will obtain baseline bloodwork today to rule out metabolic issues vs infectious etiology.   Potassium of 2.7; will replete in the ED today as well as give mag. Pt will likely need to be admitted for hypokalemia. Mild leukocytosis of 11.1; no signs of pneumonia on CXR. Still awaiting U/A and xray results.   Xray right elbow with joint effusion. Upon further eval pt does have small joint effusion and is significantly tender with decreased ROM at the elbow. Will aspirate joint today and collect synovial fluid for assessment. Suspect inflammatory arthritis given multiple joint component. No signs of effusion on remainder of xrays or fractures.   Joint aspiration successfully performed with attending physician Dr. Hyacinth MeekerMiller, see their procedure note. At shift change case signed out to Dr. Hyacinth MeekerMiller who will follow up on results and dispo patient accordingly.       Final Clinical Impressions(s) / ED Diagnoses   Final diagnoses:  Hypokalemia  Visual hallucinations  Effusion of right elbow    ED Discharge Orders    None       Tanda RockersVenter, Jadene Stemmer, Cordelia Poche-C 02/05/19 2208    Eber HongMiller, Brian, MD 02/07/19 1341

## 2019-02-05 NOTE — ED Triage Notes (Signed)
Pt brought in by family. Pt complaining of arm pain, ankle pain and swelling noted in right thumb swollen and red. Pt is reporting she is seeing people in house and that someone is filling her refrigerator.

## 2019-02-05 NOTE — ED Notes (Signed)
Stanton Kidney- Sister: [phone], [phone]

## 2019-02-05 NOTE — ED Triage Notes (Signed)
Pt sister reports pt is having visual hallucinations. Denies taking any medication.  Pt is complaining of right ankle pain and right thumb pain. Denies fall or injury. Pt has hx of pins in right ankle from past injury.

## 2019-02-06 ENCOUNTER — Encounter (HOSPITAL_COMMUNITY): Payer: Self-pay

## 2019-02-06 DIAGNOSIS — R531 Weakness: Secondary | ICD-10-CM

## 2019-02-06 DIAGNOSIS — M25421 Effusion, right elbow: Secondary | ICD-10-CM

## 2019-02-06 DIAGNOSIS — Z825 Family history of asthma and other chronic lower respiratory diseases: Secondary | ICD-10-CM | POA: Diagnosis not present

## 2019-02-06 DIAGNOSIS — F0390 Unspecified dementia without behavioral disturbance: Secondary | ICD-10-CM

## 2019-02-06 DIAGNOSIS — R441 Visual hallucinations: Secondary | ICD-10-CM | POA: Diagnosis not present

## 2019-02-06 DIAGNOSIS — Z20828 Contact with and (suspected) exposure to other viral communicable diseases: Secondary | ICD-10-CM | POA: Diagnosis present

## 2019-02-06 DIAGNOSIS — E538 Deficiency of other specified B group vitamins: Secondary | ICD-10-CM | POA: Diagnosis present

## 2019-02-06 DIAGNOSIS — D649 Anemia, unspecified: Secondary | ICD-10-CM | POA: Diagnosis present

## 2019-02-06 DIAGNOSIS — F0280 Dementia in other diseases classified elsewhere without behavioral disturbance: Secondary | ICD-10-CM | POA: Diagnosis present

## 2019-02-06 DIAGNOSIS — M064 Inflammatory polyarthropathy: Secondary | ICD-10-CM | POA: Diagnosis present

## 2019-02-06 DIAGNOSIS — G309 Alzheimer's disease, unspecified: Secondary | ICD-10-CM | POA: Diagnosis present

## 2019-02-06 DIAGNOSIS — E876 Hypokalemia: Secondary | ICD-10-CM | POA: Diagnosis present

## 2019-02-06 DIAGNOSIS — M1990 Unspecified osteoarthritis, unspecified site: Secondary | ICD-10-CM | POA: Diagnosis present

## 2019-02-06 LAB — CBC
HCT: 31.5 % — ABNORMAL LOW (ref 36.0–46.0)
Hemoglobin: 10.1 g/dL — ABNORMAL LOW (ref 12.0–15.0)
MCH: 31.7 pg (ref 26.0–34.0)
MCHC: 32.1 g/dL (ref 30.0–36.0)
MCV: 98.7 fL (ref 80.0–100.0)
Platelets: 266 10*3/uL (ref 150–400)
RBC: 3.19 MIL/uL — ABNORMAL LOW (ref 3.87–5.11)
RDW: 12.3 % (ref 11.5–15.5)
WBC: 9.2 10*3/uL (ref 4.0–10.5)
nRBC: 0 % (ref 0.0–0.2)

## 2019-02-06 LAB — SYNOVIAL CELL COUNT + DIFF, W/ CRYSTALS
Crystals, Fluid: NONE SEEN
Lymphocytes-Synovial Fld: 7 % (ref 0–20)
Monocyte-Macrophage-Synovial Fluid: 12 % — ABNORMAL LOW (ref 50–90)
Neutrophil, Synovial: 81 % — ABNORMAL HIGH (ref 0–25)
WBC, Synovial: 39200 /mm3 — ABNORMAL HIGH (ref 0–200)

## 2019-02-06 LAB — COMPREHENSIVE METABOLIC PANEL
ALT: 9 U/L (ref 0–44)
AST: 14 U/L — ABNORMAL LOW (ref 15–41)
Albumin: 2.8 g/dL — ABNORMAL LOW (ref 3.5–5.0)
Alkaline Phosphatase: 55 U/L (ref 38–126)
Anion gap: 8 (ref 5–15)
BUN: 5 mg/dL — ABNORMAL LOW (ref 8–23)
CO2: 26 mmol/L (ref 22–32)
Calcium: 8.7 mg/dL — ABNORMAL LOW (ref 8.9–10.3)
Chloride: 104 mmol/L (ref 98–111)
Creatinine, Ser: 0.64 mg/dL (ref 0.44–1.00)
GFR calc Af Amer: >60 mL/min (ref >60)
GFR calc non Af Amer: >60 mL/min (ref >60)
Glucose, Bld: 105 mg/dL — ABNORMAL HIGH (ref 70–99)
Potassium: 3.3 mmol/L — ABNORMAL LOW (ref 3.5–5.1)
Sodium: 138 mmol/L (ref 135–145)
Total Bilirubin: 1.1 mg/dL (ref 0.3–1.2)
Total Protein: 6.5 g/dL (ref 6.5–8.1)

## 2019-02-06 LAB — SEDIMENTATION RATE: Sed Rate: 90 mm/hr — ABNORMAL HIGH (ref 0–22)

## 2019-02-06 LAB — URIC ACID: Uric Acid, Serum: 4.2 mg/dL (ref 2.5–7.1)

## 2019-02-06 LAB — GLUCOSE, CAPILLARY: Glucose-Capillary: 138 mg/dL — ABNORMAL HIGH (ref 70–99)

## 2019-02-06 LAB — MAGNESIUM: Magnesium: 2.4 mg/dL (ref 1.7–2.4)

## 2019-02-06 LAB — SARS CORONAVIRUS 2 BY RT PCR (HOSPITAL ORDER, PERFORMED IN ~~LOC~~ HOSPITAL LAB): SARS Coronavirus 2: NEGATIVE

## 2019-02-06 MED ORDER — ACETAMINOPHEN 325 MG PO TABS
650.0000 mg | ORAL_TABLET | Freq: Four times a day (QID) | ORAL | Status: DC | PRN
  Administered 2019-02-06: 650 mg via ORAL
  Filled 2019-02-06: qty 2

## 2019-02-06 MED ORDER — FERROUS SULFATE 325 (65 FE) MG PO TABS
325.0000 mg | ORAL_TABLET | Freq: Every day | ORAL | Status: DC
  Administered 2019-02-06 – 2019-02-09 (×4): 325 mg via ORAL
  Filled 2019-02-06 (×4): qty 1

## 2019-02-06 MED ORDER — INDOMETHACIN 50 MG PO CAPS
50.0000 mg | ORAL_CAPSULE | Freq: Two times a day (BID) | ORAL | Status: AC
  Administered 2019-02-06 (×2): 50 mg via ORAL
  Filled 2019-02-06: qty 2
  Filled 2019-02-06 (×2): qty 1

## 2019-02-06 MED ORDER — SODIUM CHLORIDE 0.9 % IV SOLN
2.0000 g | INTRAVENOUS | Status: AC
  Administered 2019-02-06 – 2019-02-07 (×2): 2 g via INTRAVENOUS
  Filled 2019-02-06 (×2): qty 20

## 2019-02-06 MED ORDER — VANCOMYCIN HCL IN DEXTROSE 750-5 MG/150ML-% IV SOLN
750.0000 mg | Freq: Once | INTRAVENOUS | Status: AC
  Administered 2019-02-06: 750 mg via INTRAVENOUS
  Filled 2019-02-06: qty 150

## 2019-02-06 MED ORDER — SODIUM CHLORIDE 0.9 % IV SOLN
1.0000 g | INTRAVENOUS | Status: DC
  Administered 2019-02-06: 1 g via INTRAVENOUS
  Filled 2019-02-06: qty 10

## 2019-02-06 MED ORDER — DONEPEZIL HCL 5 MG PO TABS
5.0000 mg | ORAL_TABLET | Freq: Every day | ORAL | Status: DC
  Administered 2019-02-06 – 2019-02-08 (×4): 5 mg via ORAL
  Filled 2019-02-06 (×4): qty 1

## 2019-02-06 MED ORDER — SODIUM CHLORIDE 0.9 % IV SOLN: 2.0000 g | INTRAVENOUS | Status: DC

## 2019-02-06 MED ORDER — LORAZEPAM 0.5 MG PO TABS
0.5000 mg | ORAL_TABLET | Freq: Two times a day (BID) | ORAL | Status: DC | PRN
  Administered 2019-02-06 – 2019-02-08 (×2): 0.5 mg via ORAL
  Filled 2019-02-06 (×2): qty 1

## 2019-02-06 MED ORDER — SIMETHICONE 80 MG PO CHEW: 80.0000 mg | CHEWABLE_TABLET | Freq: Four times a day (QID) | ORAL | Status: DC | PRN

## 2019-02-06 MED ORDER — VANCOMYCIN HCL 500 MG IV SOLR
500.0000 mg | Freq: Two times a day (BID) | INTRAVENOUS | Status: AC
  Administered 2019-02-07 (×2): 500 mg via INTRAVENOUS
  Filled 2019-02-06 (×6): qty 500

## 2019-02-06 MED ORDER — QUETIAPINE FUMARATE 25 MG PO TABS
25.0000 mg | ORAL_TABLET | Freq: Every day | ORAL | Status: DC
  Administered 2019-02-06 – 2019-02-08 (×4): 25 mg via ORAL
  Filled 2019-02-06 (×4): qty 1

## 2019-02-06 MED ORDER — ENOXAPARIN SODIUM 40 MG/0.4ML ~~LOC~~ SOLN
40.0000 mg | SUBCUTANEOUS | Status: DC
  Administered 2019-02-06 – 2019-02-09 (×3): 40 mg via SUBCUTANEOUS
  Filled 2019-02-06 (×3): qty 0.4

## 2019-02-06 MED ORDER — VANCOMYCIN HCL IN DEXTROSE 750-5 MG/150ML-% IV SOLN
750.0000 mg | INTRAVENOUS | Status: AC
  Administered 2019-02-06: 750 mg via INTRAVENOUS
  Filled 2019-02-06: qty 150

## 2019-02-06 MED ORDER — SENNOSIDES-DOCUSATE SODIUM 8.6-50 MG PO TABS
2.0000 | ORAL_TABLET | Freq: Every day | ORAL | Status: DC
  Administered 2019-02-06 – 2019-02-08 (×3): 2 via ORAL
  Filled 2019-02-06 (×3): qty 2

## 2019-02-06 MED ORDER — ONDANSETRON HCL 4 MG PO TABS: 4.0000 mg | ORAL_TABLET | Freq: Four times a day (QID) | ORAL | Status: DC | PRN

## 2019-02-06 MED ORDER — SODIUM CHLORIDE 0.9 % IV SOLN
INTRAVENOUS | Status: DC
  Administered 2019-02-06: 02:00:00 via INTRAVENOUS

## 2019-02-06 MED ORDER — OXYCODONE HCL 5 MG PO TABS
5.0000 mg | ORAL_TABLET | ORAL | Status: DC | PRN
  Administered 2019-02-06 – 2019-02-08 (×5): 5 mg via ORAL
  Filled 2019-02-06 (×5): qty 1

## 2019-02-06 MED ORDER — ONDANSETRON HCL 4 MG/2ML IJ SOLN: 4.0000 mg | Freq: Four times a day (QID) | INTRAMUSCULAR | Status: DC | PRN

## 2019-02-06 MED ORDER — COLCHICINE 0.6 MG PO TABS
0.6000 mg | ORAL_TABLET | Freq: Two times a day (BID) | ORAL | Status: AC
  Administered 2019-02-06 – 2019-02-08 (×6): 0.6 mg via ORAL
  Filled 2019-02-06 (×6): qty 1

## 2019-02-06 NOTE — Progress Notes (Signed)
Patient seen and evaluated, chart reviewed, please see EMR for updated orders. Please see full H&P dictated by admitting physician Dr. Darrick Meigs for same date of service.    Summary 73 y.o. female, with history of dementia, anemia, hypokalemia admitted on 02/06/2019 with concerns about right elbow septic arthritis--continue IV Rocephin and vancomycin pending cultures   Patient seen and evaluated, chart reviewed, please see EMR for updated orders. Please see full H&P dictated by admitting physician Dr. Darrick Meigs for same date of service.   Cell count 39k with 81 % PMN ESR--- 90 Uric 4.2  Wendy Mccann

## 2019-02-06 NOTE — H&P (Addendum)
TRH H&P    Patient Demographics:    Wendy Mccann, is a 73 y.o. female  MRN: [id number]  DOB - [birth year]  Admit Date - 02/05/2019  Referring MD/NP/PA: Noemi Chapel  Outpatient Primary MD for the patient is [place]  Patient coming from: Home  Chief complaint-hallucinations   HPI:    Wendy Mccann  is a 73 y.o. female, with history of dementia, anemia, hypokalemia was brought to hospital with complaints of hallucinations for past 1 week.  When patient told her family members about hallucinations she was brought to the hospital for further evaluation.  Patient was found to have swollen joints including right wrist, elbow and right ankle.  Arthrocentesis was performed on right elbow joint and fluid analysis showed 39,000 WBC with 81% PMNs.  Gram stain is negative.  Crystals were negative. She denies nausea vomiting or diarrhea Denies chest pain or shortness of breath Denies fever or chills    Review of systems:    In addition to the HPI above,    All other systems reviewed and are negative.    Past History of the following :    Past Medical History:  Diagnosis Date   Alzheimer's dementia ([place])    Anemia    Medical history non-contributory    Rotator cuff (capsule) sprain       Past Surgical History:  Procedure Laterality Date   ankles        Social History:      Social History   Tobacco Use   Smoking status: Never Smoker   Smokeless tobacco: Never Used  Substance Use Topics   Alcohol use: No       Family History :     Family History  Problem Relation Age of Onset   COPD Father       Home Medications:   Prior to Admission medications   Medication Sig Start Date End Date Taking? Authorizing Provider  acetaminophen (TYLENOL) 325 MG tablet Take 2 tablets (650 mg total) by mouth every 6 (six) hours as needed for mild pain, fever or  headache (or Fever >/= 101). 01/23/19  Yes Emokpae, Courage, MD  cyanocobalamin (,VITAMIN B-12,) 1000 MCG/ML injection Inject 1 mL (1,000 mcg total) into the muscle every 30 (thirty) days. 02/25/19  Yes Emokpae, Courage, MD  donepezil (ARICEPT) 5 MG tablet Take 1 tablet (5 mg total) by mouth at bedtime. 01/28/19  Yes Gerlene Fee, NP  ferrous sulfate 325 (65 FE) MG tablet Take 1 tablet (325 mg total) by mouth daily with breakfast. 01/28/19  Yes Gerlene Fee, NP  LORazepam (ATIVAN) 0.5 MG tablet Take 1 tablet (0.5 mg total) by mouth every 12 (twelve) hours as needed for anxiety or sleep. 01/28/19 01/28/20 Yes Gerlene Fee, NP  ondansetron (ZOFRAN) 4 MG tablet Take 1 tablet (4 mg total) by mouth every 8 (eight) hours as needed for nausea or vomiting. 01/28/19 01/28/20 Yes Gerlene Fee, NP  potassium chloride SA (K-DUR) 20 MEQ tablet Take 20 mEq by mouth daily. For 3  days   Yes [provider]  QUEtiapine (SEROQUEL) 25 MG tablet Take 1 tablet (25 mg total) by mouth at bedtime. 01/28/19  Yes Gerlene Fee, NP  senna-docusate (SENOKOT-S) 8.6-50 MG tablet Take 2 tablets by mouth at bedtime. 01/23/19 01/23/20 Yes Emokpae, Courage, MD  simethicone (MYLICON) 80 MG chewable tablet Chew 1 tablet (80 mg total) by mouth every 6 (six) hours as needed for flatulence. 12/23/18  Yes Fawze, Mina A, PA-C     Allergies:    No Known Allergies   Physical Exam:   Vitals  Blood pressure 115/82, pulse 90, temperature 98.4 F (36.9 C), temperature source Oral, resp. rate 17, height 5' 1"  (1.549 m), weight 63.5 kg, SpO2 96 %.  1.  General: Appears in no acute distress  2. Psychiatric: Alert, oriented x3, intact insight and judgment  3. Neurologic: Cranial nerves II to XII grossly intact, motor strength 5/5 in all extremities  4. HEENMT:  Atraumatic normocephalic, extraocular muscles are intact, oral mucosa is pink and moist  5. Respiratory : Clear to auscultation bilaterally, no wheezing or  crackles  6. Cardiovascular : S1-S2, regular, no murmur auscultated  7. Gastrointestinal:  Abdomen is soft, nontender, no organomegaly  8. Skin:  No rashes noted  9.Musculoskeletal:  Erythema, edema noted at the first carpometacarpal joint, right wrist joint, right elbow.  Swelling noted at right ankle joint, warm and tender to palpation    Data Review:    CBC Recent Labs  Lab 02/05/19 2000  WBC 11.1*  HGB 10.6*  HCT 32.1*  PLT 317  MCV 95.8  MCH 31.6  MCHC 33.0  RDW 12.2  LYMPHSABS 1.8  MONOABS 1.9*  EOSABS 0.0  BASOSABS 0.0   ------------------------------------------------------------------------------------------------------------------  Results for orders placed or performed during the hospital encounter of 02/05/19 (from the past 48 hour(s))  CBC with Differential     Status: Abnormal   Collection Time: 02/05/19  8:00 PM  Result Value Ref Range   WBC 11.1 (H) 4.0 - 10.5 K/uL   RBC 3.35 (L) 3.87 - 5.11 MIL/uL   Hemoglobin 10.6 (L) 12.0 - 15.0 g/dL   HCT 32.1 (L) 36.0 - 46.0 %   MCV 95.8 80.0 - 100.0 fL   MCH 31.6 26.0 - 34.0 pg   MCHC 33.0 30.0 - 36.0 g/dL   RDW 12.2 11.5 - 15.5 %   Platelets 317 150 - 400 K/uL   nRBC 0.0 0.0 - 0.2 %   Neutrophils Relative % 66 %   Neutro Abs 7.4 1.7 - 7.7 K/uL   Lymphocytes Relative 16 %   Lymphs Abs 1.8 0.7 - 4.0 K/uL   Monocytes Relative 17 %   Monocytes Absolute 1.9 (H) 0.1 - 1.0 K/uL   Eosinophils Relative 0 %   Eosinophils Absolute 0.0 0.0 - 0.5 K/uL   Basophils Relative 0 %   Basophils Absolute 0.0 0.0 - 0.1 K/uL   Immature Granulocytes 1 %   Abs Immature Granulocytes 0.06 0.00 - 0.07 K/uL    Comment: Performed at [facility], [address], [place], [place] [address]  Troponin I (High Sensitivity)     Status: None   Collection Time: 02/05/19  8:00 PM  Result Value Ref Range   Troponin I (High Sensitivity) 3 <18 ng/L    Comment: (NOTE) Elevated high sensitivity troponin I (hsTnI) values and significant    changes across serial measurements may suggest ACS but many other  chronic and acute conditions are known to elevate hsTnI  results.  Refer to the "Links" section for chest pain algorithms and additional  guidance. Performed at [facility], [address] [place], [place] [address]   Comprehensive metabolic panel     Status: Abnormal   Collection Time: 02/05/19  8:00 PM  Result Value Ref Range   Sodium 135 135 - 145 mmol/L   Potassium 2.7 (LL) 3.5 - 5.1 mmol/L    Comment: CRITICAL RESULT CALLED TO, READ BACK BY AND VERIFIED WITH: MYRICK,B ON 02/05/19 AT 2040 BY LOY,C    Chloride 98 98 - 111 mmol/L   CO2 25 22 - 32 mmol/L   Glucose, Bld 109 (H) 70 - 99 mg/dL   BUN 9 8 - 23 mg/dL   Creatinine, Ser 0.76 0.44 - 1.00 mg/dL   Calcium 9.3 8.9 - 10.3 mg/dL   Total Protein 7.9 6.5 - 8.1 g/dL   Albumin 3.4 (L) 3.5 - 5.0 g/dL   AST 16 15 - 41 U/L   ALT 10 0 - 44 U/L   Alkaline Phosphatase 66 38 - 126 U/L   Total Bilirubin 1.4 (H) 0.3 - 1.2 mg/dL   GFR calc non Af Amer >60 >60 mL/min   GFR calc Af Amer >60 >60 mL/min   Anion gap 12 5 - 15    Comment: Performed at [facility], [address] [place], [place] [address]  Synovial cell count + diff, w/ crystals     Status: Abnormal   Collection Time: 02/05/19  9:56 PM  Result Value Ref Range   Color, Synovial AMBER (A) YELLOW   Appearance-Synovial TURBID (A) CLEAR   Crystals, Fluid NO CRYSTALS SEEN    WBC, Synovial 39,200 (H) 0 - 200 /cu mm   Neutrophil, Synovial 81 (H) 0 - 25 %   Lymphocytes-Synovial Fld 7 0 - 20 %   Monocyte-Macrophage-Synovial Fluid 12 (L) 50 - 90 %    Comment: Performed at [facility], [address], [place], [place] [address]  Gram stain     Status: None   Collection Time: 02/05/19  9:56 PM   Specimen: Synovium; Synovial Fluid  Result Value Ref Range   Specimen Description SYNOVIAL    Special Requests RIGHT ELBOW    Gram Stain      ABUNDANT POLYMORPHONUCLEAR NO ORGANISMS SEEN Performed at [facility], [address] [place], [place] [address]    Report Status 02/05/2019 FINAL   SARS Coronavirus 2 (CEPHEID - Performed in [place] hospital lab), Hosp Order     Status: None   Collection Time: 02/05/19 11:09 PM   Specimen: Nasopharyngeal Swab  Result Value Ref Range   SARS Coronavirus 2 NEGATIVE NEGATIVE    Comment: (NOTE) If result is NEGATIVE SARS-CoV-2 target nucleic acids are NOT DETECTED. The SARS-CoV-2 RNA is generally detectable in upper and lower  respiratory specimens during the acute phase of infection. The lowest  concentration of SARS-CoV-2 viral copies this assay can detect is 250  copies / mL. A negative result does not preclude SARS-CoV-2 infection  and should not be used as the sole basis for treatment or other  patient management decisions.  A negative result may occur with  improper specimen collection / handling, submission of specimen other  than nasopharyngeal swab, presence of viral mutation(s) within the  areas targeted by this assay, and inadequate number of viral copies  (<250 copies / mL). A negative result must be combined with clinical  observations, patient history, and epidemiological information. If result is POSITIVE SARS-CoV-2  target nucleic acids are DETECTED. The SARS-CoV-2 RNA is generally detectable in upper and lower  respiratory specimens dur ing the acute phase of infection.  Positive  results are indicative of active infection with SARS-CoV-2.  Clinical  correlation with patient history and other diagnostic information is  necessary to determine patient infection status.  Positive results do  not rule out bacterial infection or co-infection with other viruses. If result is PRESUMPTIVE POSTIVE SARS-CoV-2 nucleic acids MAY BE PRESENT.   A presumptive positive result was obtained on the submitted specimen  and confirmed on repeat testing.  While 2019 novel coronavirus  (SARS-CoV-2) nucleic acids may be present in the submitted sample   additional confirmatory testing may be necessary for epidemiological  and / or clinical management purposes  to differentiate between  SARS-CoV-2 and other Sarbecovirus currently known to infect humans.  If clinically indicated additional testing with an alternate test  methodology [phone]) is advised. The SARS-CoV-2 RNA is generally  detectable in upper and lower respiratory sp ecimens during the acute  phase of infection. The expected result is Negative. Fact Sheet for Patients:  StrictlyIdeas.no Fact Sheet for Healthcare Providers: BankingDealers.co.za This test is not yet approved or cleared by the [place] FDA and has been authorized for detection and/or diagnosis of SARS-CoV-2 by FDA under an Emergency Use Authorization (EUA).  This EUA will remain in effect (meaning this test can be used) for the duration of the COVID-19 declaration under Section 564(b)(1) of the Act, 21 U.S.C. section 360bbb-3(b)(1), unless the authorization is terminated or revoked sooner. Performed at [facility], [address], [place], [place] [address]     Chemistries  Recent Labs  Lab 02/05/19 2000  NA 135  K 2.7*  CL 98  CO2 25  GLUCOSE 109*  BUN 9  CREATININE 0.76  CALCIUM 9.3  AST 16  ALT 10  ALKPHOS 66  BILITOT 1.4*   ------------------------------------------------------------------------------------------------------------------  ------------------------------------------------------------------------------------------------------------------ GFR: Estimated Creatinine Clearance: 54.3 mL/min (by C-G formula based on SCr of 0.76 mg/dL). Liver Function Tests: Recent Labs  Lab 02/05/19 2000  AST 16  ALT 10  ALKPHOS 66  BILITOT 1.4*  PROT 7.9  ALBUMIN 3.4*   No results for input(s): LIPASE, AMYLASE in the last 168 hours. No results for input(s): AMMONIA in the last 168 hours. Coagulation Profile: No results for input(s):  INR, PROTIME in the last 168 hours. Cardiac Enzymes: No results for input(s): CKTOTAL, CKMB, CKMBINDEX, TROPONINI in the last 168 hours. BNP (last 3 results) No results for input(s): PROBNP in the last 8760 hours. HbA1C: No results for input(s): HGBA1C in the last 72 hours. CBG: No results for input(s): GLUCAP in the last 168 hours. Lipid Profile: No results for input(s): CHOL, HDL, LDLCALC, TRIG, CHOLHDL, LDLDIRECT in the last 72 hours. Thyroid Function Tests: No results for input(s): TSH, T4TOTAL, FREET4, T3FREE, THYROIDAB in the last 72 hours. Anemia Panel: No results for input(s): VITAMINB12, FOLATE, FERRITIN, TIBC, IRON, RETICCTPCT in the last 72 hours.  --------------------------------------------------------------------------------------------------------------- Urine analysis:    Component Value Date/Time   COLORURINE YELLOW 01/22/2019 2058   APPEARANCEUR CLEAR 01/22/2019 2058   LABSPEC >1.030 (H) 01/22/2019 2058   PHURINE 6.0 01/22/2019 2058   GLUCOSEU 100 (A) 01/22/2019 2058   HGBUR MODERATE (A) 01/22/2019 2058   BILIRUBINUR SMALL (A) 01/22/2019 2058   KETONESUR TRACE (A) 01/22/2019 2058   PROTEINUR 30 (A) 01/22/2019 2058   NITRITE NEGATIVE 01/22/2019 2058   LEUKOCYTESUR NEGATIVE 01/22/2019 2058  Imaging Results:    Dg Chest 1 View  Result Date: 02/05/2019 CLINICAL DATA:  Acute onset of right arm pain and hallucinations. Evaluate for infection. EXAM: CHEST  1 VIEW COMPARISON:  Chest radiograph performed 08/12/2016 FINDINGS: The lungs are mildly hypoexpanded. Mild vascular congestion is noted. There is no evidence of focal opacification, pleural effusion or pneumothorax. The cardiomediastinal silhouette is within normal limits. No acute osseous abnormalities are seen. IMPRESSION: Lungs mildly hypoexpanded but grossly clear. Mild vascular congestion noted. Electronically Signed   By: Garald Balding M.D.   On: 02/05/2019 21:03   Dg Elbow Complete Right  Result  Date: 02/05/2019 CLINICAL DATA:  73 year old female with right arm pain. No known injury. Evaluate for possible infection. EXAM: RIGHT ELBOW - COMPLETE 3+ VIEW; RIGHT HUMERUS - 2+ VIEW COMPARISON:  Right shoulder radiograph dated 05/19/2018 FINDINGS: No definite acute fracture identified. Small bone fragments adjacent to the lateral epicondyle as well as at the tip of the coronoid process most likely chronic. There is however elevation of the anterior fat pad indicative of joint effusion. An occult fracture or an infectious process is not excluded. Clinical correlation is recommended. No periosteal elevation or bone erosion to suggest acute osteomyelitis by radiograph. The soft tissues are unremarkable. No radiopaque foreign object or soft tissue gas. IMPRESSION: 1. No definite acute fracture.  No dislocation. 2. Elevation of the anterior fat pad of the elbow indicative of joint effusion. Clinical correlation is recommended. Electronically Signed   By: Anner Crete M.D.   On: 02/05/2019 21:09   Dg Ankle Complete Right  Result Date: 02/05/2019 CLINICAL DATA:  Right foot and ankle pain and swelling. EXAM: RIGHT ANKLE - COMPLETE 3+ VIEW COMPARISON:  None. FINDINGS: Single screw traverses the distal fibula. Advanced tibial talar joint space narrowing with osseous remodeling, subchondral cystic change and sclerosis. Mild bony fragmentation of the medial malleolus. No definite tibial talar joint effusion. There is a small plantar calcaneal spur. No bony destructive change. Moderate soft tissue edema about the ankle. No soft tissue air. No radiopaque foreign body. IMPRESSION: 1. Moderate soft tissue edema about the ankle. No soft tissue air or radiopaque foreign body. No radiographic findings of osteomyelitis. 2. Advanced tibial talar osteoarthritis. Electronically Signed   By: Keith Rake M.D.   On: 02/05/2019 21:06   Dg Humerus Right  Result Date: 02/05/2019 CLINICAL DATA:  73 year old female with right  arm pain. No known injury. Evaluate for possible infection. EXAM: RIGHT ELBOW - COMPLETE 3+ VIEW; RIGHT HUMERUS - 2+ VIEW COMPARISON:  Right shoulder radiograph dated 05/19/2018 FINDINGS: No definite acute fracture identified. Small bone fragments adjacent to the lateral epicondyle as well as at the tip of the coronoid process most likely chronic. There is however elevation of the anterior fat pad indicative of joint effusion. An occult fracture or an infectious process is not excluded. Clinical correlation is recommended. No periosteal elevation or bone erosion to suggest acute osteomyelitis by radiograph. The soft tissues are unremarkable. No radiopaque foreign object or soft tissue gas. IMPRESSION: 1. No definite acute fracture.  No dislocation. 2. Elevation of the anterior fat pad of the elbow indicative of joint effusion. Clinical correlation is recommended. Electronically Signed   By: Anner Crete M.D.   On: 02/05/2019 21:09   Dg Hand Complete Right  Result Date: 02/05/2019 CLINICAL DATA:  Acute onset of right hand pain, with thumb swelling and erythema. EXAM: RIGHT HAND - COMPLETE 3+ VIEW COMPARISON:  None. FINDINGS: There is no evidence of  fracture or dislocation. Mild degenerative change is noted at the distal interphalangeal joints, concerning for mild osteoarthritis. The carpal rows are intact, and demonstrate normal alignment. Mild calcification is noted at the triangular fibrocartilage. Mild soft tissue swelling is noted about the thumb. The thumb is otherwise grossly unremarkable. IMPRESSION: 1. No evidence of fracture or dislocation. 2. Mild soft tissue swelling about the thumb; thumb otherwise unremarkable. 3. Mild degenerative change at the distal interphalangeal joints, concerning for mild osteoarthritis. Electronically Signed   By: Garald Balding M.D.   On: 02/05/2019 21:05   Dg Foot Complete Right  Result Date: 02/05/2019 CLINICAL DATA:  Right foot and ankle pain and swelling. EXAM:  RIGHT FOOT COMPLETE - 3+ VIEW COMPARISON:  None. FINDINGS: There is no evidence of fracture or dislocation. Mild hammertoe deformity of the digits. Plantar calcaneal spur. No bony destructive change. Generalized soft tissue edema. No soft tissue air. No radiopaque foreign body. IMPRESSION: Generalized soft tissue edema. No acute osseous abnormality or findings of osteomyelitis. Electronically Signed   By: Keith Rake M.D.   On: 02/05/2019 21:08    My personal review of EKG: Rhythm NSR, no ST-T changes   Assessment & Plan:    Active Problems:   Arthritis   1. 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  WBC 39,000, 81% PMNs.  Crystals are negative, Gram stain is negative we will start empiric vancomycin and ceftriaxone.  Follow culture results.  Also check ESR, ANA, rheumatoid factor, uric acid.  We will also start indomethacin for 2 doses, colchicine 0.6 mg p.o. twice daily.  2. Hallucinations-UA has been ordered and is currently pending.  Also started on IV antibiotics as above.  Patient has underlying dementia.  Will monitor.  3. Dementia-no behavior disturbance, continue Aricept, Seroquel, Ativan  4. Hypokalemia-potassium was 2.7, IV KCl 10 mEq x 4 ordered.  Follow BMP and serum magnesium level in a.m.    DVT Prophylaxis-   Lovenox   AM Labs Ordered, also please review Full Orders  Family Communication: Admission, patients condition and plan of care including tests being ordered have been discussed with the patient  who indicate understanding and agree with the plan and Code Status.  Code Status: Full code  Admission status: Inpatient: Based on patients clinical presentation and evaluation of above clinical data, I have made determination that patient meets Inpatient criteria at this time.  Time spent in minutes : 60 minutes   Oswald Hillock M.D on 02/06/2019 at 12:54 AM

## 2019-02-06 NOTE — Progress Notes (Signed)
Pharmacy Antibiotic Note  Wendy Mccann is a 73 y.o. female admitted on 02/05/2019 with septic arthritis.  Pharmacy has been consulted for vancomycin dosing.  Plan: Vancomycin 750 mg IV every hour x 2 doses to complete loading dose of 1500 mg, followed by  Vancomycin 500 mg IV every 12 hours. Goal trough 15-20 mcg/mL. Monitor clinical progress, cultures/sensitivities, renal function, abx plan, Vancomycin levels as indicated. AP pharmacist to follow up.   Height: 5\' 1"  (154.9 cm) Weight: 140 lb (63.5 kg) IBW/kg (Calculated) : 47.8  Temp (24hrs), Avg:98.4 F (36.9 C), Min:98.4 F (36.9 C), Max:98.4 F (36.9 C)  Recent Labs  Lab 02/05/19 2000  WBC 11.1*  CREATININE 0.76    Estimated Creatinine Clearance: 54.3 mL/min (by C-G formula based on SCr of 0.76 mg/dL).    No Known Allergies  Antimicrobials this admission: 7/24 vancomycin >>   Dose adjustments this admission:  Microbiology results: 7/23 synovial fluid: sent  7/23 COVID: negative    Thank you for allowing [place] to participate in this patients care. Jens Som, PharmD 02/06/2019 1:06 AM

## 2019-02-07 DIAGNOSIS — F0390 Unspecified dementia without behavioral disturbance: Secondary | ICD-10-CM | POA: Diagnosis present

## 2019-02-07 LAB — URINALYSIS, ROUTINE W REFLEX MICROSCOPIC
Bilirubin Urine: NEGATIVE
Glucose, UA: NEGATIVE mg/dL
Hgb urine dipstick: NEGATIVE
Ketones, ur: NEGATIVE mg/dL
Leukocytes,Ua: NEGATIVE
Nitrite: NEGATIVE
Protein, ur: NEGATIVE mg/dL
Specific Gravity, Urine: 1.026 (ref 1.005–1.030)
pH: 5 (ref 5.0–8.0)

## 2019-02-07 LAB — BASIC METABOLIC PANEL
Anion gap: 7 (ref 5–15)
BUN: 13 mg/dL (ref 8–23)
CO2: 27 mmol/L (ref 22–32)
Calcium: 8.9 mg/dL (ref 8.9–10.3)
Chloride: 106 mmol/L (ref 98–111)
Creatinine, Ser: 0.68 mg/dL (ref 0.44–1.00)
GFR calc Af Amer: >60 mL/min (ref >60)
GFR calc non Af Amer: >60 mL/min (ref >60)
Glucose, Bld: 105 mg/dL — ABNORMAL HIGH (ref 70–99)
Potassium: 3.1 mmol/L — ABNORMAL LOW (ref 3.5–5.1)
Sodium: 140 mmol/L (ref 135–145)

## 2019-02-07 LAB — CBC
HCT: 30.6 % — ABNORMAL LOW (ref 36.0–46.0)
Hemoglobin: 9.5 g/dL — ABNORMAL LOW (ref 12.0–15.0)
MCH: 31.3 pg (ref 26.0–34.0)
MCHC: 31 g/dL (ref 30.0–36.0)
MCV: 100.7 fL — ABNORMAL HIGH (ref 80.0–100.0)
Platelets: 265 10*3/uL (ref 150–400)
RBC: 3.04 MIL/uL — ABNORMAL LOW (ref 3.87–5.11)
RDW: 12.3 % (ref 11.5–15.5)
WBC: 6.9 10*3/uL (ref 4.0–10.5)
nRBC: 0 % (ref 0.0–0.2)

## 2019-02-07 LAB — RHEUMATOID FACTOR: Rheumatoid fact SerPl-aCnc: 18.8 IU/mL — ABNORMAL HIGH (ref 0.0–13.9)

## 2019-02-07 MED ORDER — INDOMETHACIN 25 MG PO CAPS
50.0000 mg | ORAL_CAPSULE | Freq: Two times a day (BID) | ORAL | Status: DC
  Administered 2019-02-07 – 2019-02-09 (×5): 50 mg via ORAL
  Filled 2019-02-07 (×5): qty 2

## 2019-02-07 MED ORDER — INDOMETHACIN 25 MG PO CAPS: 50.0000 mg | ORAL_CAPSULE | Freq: Two times a day (BID) | ORAL | Status: DC

## 2019-02-07 MED ORDER — POTASSIUM CHLORIDE CRYS ER 20 MEQ PO TBCR
40.0000 meq | EXTENDED_RELEASE_TABLET | ORAL | Status: AC
  Administered 2019-02-07 (×2): 40 meq via ORAL
  Filled 2019-02-07 (×2): qty 2

## 2019-02-07 NOTE — Progress Notes (Signed)
Patient Demographics:    Wendy Mccann, is a 73 y.o. female, DOB - Jan 13, 1946, NRW:[id number]  Admit date - 02/05/2019   Admitting Physician Oswald Hillock, MD  Outpatient Primary MD for the patient is [facility]  LOS - 1   Chief Complaint  Patient presents with   Hallucinations        Subjective:    [name] today has no fevers, no emesis,  No chest pain, eating and drinking okay, right elbow pain and stiffness appears to be improving  Assessment  & Plan :    Principal Problem:   Rt Elbow Arthritis Active Problems:   Generalized weakness   Normocytic anemia   Dementia without behavioral disturbance ([name])  Brief Summary:- 73 y.o.female,with history of dementia, anemia,  admitted on 02/06/2019 with concerns about right elbow septic arthritis--     A/p 1)Rt Elbow Arthritis--- septic versus inflammatory, fluid aspirate  Cell count 39k with 81 % PMN, no crystals ESR--- 90, Uric 4.2 ---Joint fluid culture NGTD --Joint fluid Gram stain negative --WBC is down to 6.9 from 11.1 -Discussed with Dr Griffin Basil--- okay to stop IV antibiotics (IV Rocephin and IV vancomycin ) after 02/07/2019---- states this may be inflammatory arthritis, states to observe patient for at least 24 to 36 hours after stopping IV antibiotics--- if symptoms continue to improve with anti-inflammatory agents then no need for Ortho follow-up --Continue indomethacin and colchicine --If symptoms worsen after stopping IV antibiotics then referred to orthopedics for possible right elbow joint washout  2)Dementia--without behavioral disturbance patient with poor insight and cognitive deficits  ---RPRis nonreactiveand TSH is 2.3, CT head without acute findings, c/n Aricept, B12 borderline low,receivedIM B12 shot on 01/25/2019, next dose 02/25/2019  3) B12 deficiency--- continue B12 injections monthly Next  dose due 02/25/2019  Disposition/Need for in-Hospital Stay- patient unable to be discharged at this time due to patient with septic arthritis currently on IV antibiotic and anti-inflammatory agents  Code Status : Full   Family Communication: Discussed with sister [name] ([phone]  Disposition Plan  : TBD  Consults  : Full consult with orthopedic surgeon Dr. Griffin Basil on 02/07/2019  DVT Prophylaxis  : Lovenox- SCDs   Lab Results  Component Value Date   PLT 265 02/07/2019    Inpatient Medications  Scheduled Meds:  colchicine  0.6 mg Oral BID   donepezil  5 mg Oral QHS   enoxaparin (LOVENOX) injection  40 mg Subcutaneous Q24H   ferrous sulfate  325 mg Oral Q breakfast   indomethacin  50 mg Oral BID WC   QUEtiapine  25 mg Oral QHS   senna-docusate  2 tablet Oral QHS   Continuous Infusions:  sodium chloride 75 mL/hr at 02/06/19 4720   vancomycin 500 mg (02/07/19 1555)   PRN Meds:.acetaminophen, LORazepam, ondansetron **OR** ondansetron (ZOFRAN) IV, oxyCODONE, simethicone   Anti-infectives (From admission, onward)   Start     Dose/Rate Route Frequency Ordered Stop   02/07/19 0500  cefTRIAXone (ROCEPHIN) 2 g in sodium chloride 0.9 % 100 mL IVPB  Status:  Discontinued     2 g 200 mL/hr over 30 Minutes Intravenous Every 24 hours 02/06/19 1102 02/06/19 1349   02/06/19 1600  vancomycin (VANCOCIN) 500 mg in sodium  chloride 0.9 % 100 mL IVPB     500 mg 100 mL/hr over 60 Minutes Intravenous Every 12 hours 02/06/19 0338 02/07/19 2359   02/06/19 1600  cefTRIAXone (ROCEPHIN) 2 g in sodium chloride 0.9 % 100 mL IVPB     2 g 200 mL/hr over 30 Minutes Intravenous Every 24 hours 02/06/19 1349 02/07/19 1550   02/06/19 0430  vancomycin (VANCOCIN) IVPB 750 mg/150 ml premix     750 mg 150 mL/hr over 60 Minutes Intravenous  Once 02/06/19 0338 02/06/19 0535   02/06/19 0200  cefTRIAXone (ROCEPHIN) 1 g in sodium chloride 0.9 % 100 mL IVPB  Status:  Discontinued     1  g 200 mL/hr over 30 Minutes Intravenous Every 24 hours 02/06/19 0152 02/06/19 1102   02/06/19 0130  vancomycin (VANCOCIN) IVPB 750 mg/150 ml premix     750 mg 150 mL/hr over 60 Minutes Intravenous Every 1 hr x 2 02/06/19 0112 02/06/19 0329        Objective:   Vitals:   02/06/19 1945 02/06/19 2142 02/07/19 0558 02/07/19 1335  BP:  (!) 109/45 (!) 92/59 (!) 112/59  Pulse:  74 73 77  Resp:  17 16 17   Temp:  97.9 F (36.6 C) 98.4 F (36.9 C)   TempSrc:      SpO2: 98% 100% 100% 100%  Weight:      Height:        Wt Readings from Last 3 Encounters:  02/05/19 63.5 kg  01/28/19 60.6 kg  01/22/19 62.8 kg     Intake/Output Summary (Last 24 hours) at 02/07/2019 1742 Last data filed at 02/07/2019 1537 Gross per 24 hour  Intake 1987.68 ml  Output 200 ml  Net 1787.68 ml     Physical Exam  Gen:- Awake Alert,  In no apparent distress  HEENT:- [place].AT, No sclera icterus Neck-Supple Neck,No JVD,.  Lungs-  CTAB , fair symmetrical air movement CV- S1, S2 normal, regular  Abd-  +ve B.Sounds, Abd Soft, No tenderness,    Extremity/Skin:- No  edema, pedal pulses present  Psych-affect is appropriate, some cognitive deficits  neuro-generalized weakness, no new focal deficits, no tremors MSK--- right elbow swelling, warmth,  tenderness appears to be improving, right elbow range of motion is improving--- no streaking  Data Review:   Micro Results Recent Results (from the past 240 hour(s))  Body fluid culture     Status: None (Preliminary result)   Collection Time: 02/05/19  9:33 PM   Specimen: Synovium; Body Fluid  Result Value Ref Range Status   Specimen Description   Final    SYNOVIAL Performed at [facility], [address], [place], [place] [address]    Special Requests   Final    R ELBOW Performed at [facility], [address] [place], [place] [address]    Gram Stain   Final    MODERATE WBC PRESENT,BOTH PMN AND MONONUCLEAR NO ORGANISMS SEEN    Culture   Final    NO  GROWTH < 24 HOURS Performed at [facility], [place] [address], [place], [place] [address]    Report Status PENDING  Incomplete  Gram stain     Status: None   Collection Time: 02/05/19  9:56 PM   Specimen: Synovium; Synovial Fluid  Result Value Ref Range Status   Specimen Description SYNOVIAL  Final   Special Requests RIGHT ELBOW  Final   Gram Stain   Final    ABUNDANT POLYMORPHONUCLEAR NO ORGANISMS SEEN Performed  at [facility], [address] [place], [place] [address]    Report Status 02/05/2019 FINAL  Final  SARS Coronavirus 2 (CEPHEID - Performed in [place] hospital lab), Hosp Order     Status: None   Collection Time: 02/05/19 11:09 PM   Specimen: Nasopharyngeal Swab  Result Value Ref Range Status   SARS Coronavirus 2 NEGATIVE NEGATIVE Final    Comment: (NOTE) If result is NEGATIVE SARS-CoV-2 target nucleic acids are NOT DETECTED. The SARS-CoV-2 RNA is generally detectable in upper and lower  respiratory specimens during the acute phase of infection. The lowest  concentration of SARS-CoV-2 viral copies this assay can detect is 250  copies / mL. A negative result does not preclude SARS-CoV-2 infection  and should not be used as the sole basis for treatment or other  patient management decisions.  A negative result may occur with  improper specimen collection / handling, submission of specimen other  than nasopharyngeal swab, presence of viral mutation(s) within the  areas targeted by this assay, and inadequate number of viral copies  (<250 copies / mL). A negative result must be combined with clinical  observations, patient history, and epidemiological information. If result is POSITIVE SARS-CoV-2 target nucleic acids are DETECTED. The SARS-CoV-2 RNA is generally detectable in upper and lower  respiratory specimens dur ing the acute phase of infection.  Positive  results are indicative of active infection with SARS-CoV-2.  Clinical  correlation with patient  history and other diagnostic information is  necessary to determine patient infection status.  Positive results do  not rule out bacterial infection or co-infection with other viruses. If result is PRESUMPTIVE POSTIVE SARS-CoV-2 nucleic acids MAY BE PRESENT.   A presumptive positive result was obtained on the submitted specimen  and confirmed on repeat testing.  While 2019 novel coronavirus  (SARS-CoV-2) nucleic acids may be present in the submitted sample  additional confirmatory testing may be necessary for epidemiological  and / or clinical management purposes  to differentiate between  SARS-CoV-2 and other Sarbecovirus currently known to infect humans.  If clinically indicated additional testing with an alternate test  methodology [phone]) is advised. The SARS-CoV-2 RNA is generally  detectable in upper and lower respiratory sp ecimens during the acute  phase of infection. The expected result is Negative. Fact Sheet for Patients:  StrictlyIdeas.no Fact Sheet for Healthcare Providers: BankingDealers.co.za This test is not yet approved or cleared by the [place] FDA and has been authorized for detection and/or diagnosis of SARS-CoV-2 by FDA under an Emergency Use Authorization (EUA).  This EUA will remain in effect (meaning this test can be used) for the duration of the COVID-19 declaration under Section 564(b)(1) of the Act, 21 U.S.C. section 360bbb-3(b)(1), unless the authorization is terminated or revoked sooner. Performed at [facility], [address] [place], [place] [address]     Radiology Reports Dg Chest 1 View  Result Date: 02/05/2019 CLINICAL DATA:  Acute onset of right arm pain and hallucinations. Evaluate for infection. EXAM: CHEST  1 VIEW COMPARISON:  Chest radiograph performed 08/12/2016 FINDINGS: The lungs are mildly hypoexpanded. Mild vascular congestion is noted. There is no evidence of focal opacification,  pleural effusion or pneumothorax. The cardiomediastinal silhouette is within normal limits. No acute osseous abnormalities are seen. IMPRESSION: Lungs mildly hypoexpanded but grossly clear. Mild vascular congestion noted. Electronically Signed   By: Garald Balding M.D.   On: 02/05/2019 21:03   Dg Elbow Complete Right  Result Date: 02/05/2019 CLINICAL DATA:  73 year old female with right arm pain. No known injury. Evaluate for possible infection. EXAM: RIGHT ELBOW - COMPLETE 3+ VIEW; RIGHT HUMERUS - 2+ VIEW COMPARISON:  Right shoulder radiograph dated 05/19/2018 FINDINGS: No definite acute fracture identified. Small bone fragments adjacent to the lateral epicondyle as well as at the tip of the coronoid process most likely chronic. There is however elevation of the anterior fat pad indicative of joint effusion. An occult fracture or an infectious process is not excluded. Clinical correlation is recommended. No periosteal elevation or bone erosion to suggest acute osteomyelitis by radiograph. The soft tissues are unremarkable. No radiopaque foreign object or soft tissue gas. IMPRESSION: 1. No definite acute fracture.  No dislocation. 2. Elevation of the anterior fat pad of the elbow indicative of joint effusion. Clinical correlation is recommended. Electronically Signed   By: Anner Crete M.D.   On: 02/05/2019 21:09   Dg Ankle Complete Right  Result Date: 02/05/2019 CLINICAL DATA:  Right foot and ankle pain and swelling. EXAM: RIGHT ANKLE - COMPLETE 3+ VIEW COMPARISON:  None. FINDINGS: Single screw traverses the distal fibula. Advanced tibial talar joint space narrowing with osseous remodeling, subchondral cystic change and sclerosis. Mild bony fragmentation of the medial malleolus. No definite tibial talar joint effusion. There is a small plantar calcaneal spur. No bony destructive change. Moderate soft tissue edema about the ankle. No soft tissue air. No radiopaque foreign body. IMPRESSION: 1. Moderate  soft tissue edema about the ankle. No soft tissue air or radiopaque foreign body. No radiographic findings of osteomyelitis. 2. Advanced tibial talar osteoarthritis. Electronically Signed   By: Keith Rake M.D.   On: 02/05/2019 21:06   Ct Head Wo Contrast  Result Date: 01/23/2019 CLINICAL DATA:  Metabolic encephalopathy. EXAM: CT HEAD WITHOUT CONTRAST TECHNIQUE: Contiguous axial images were obtained from the base of the skull through the vertex without intravenous contrast. COMPARISON:  None. FINDINGS: Brain: No evidence of acute infarction, hemorrhage, hydrocephalus, extra-axial collection or mass lesion/mass effect. Vascular: No hyperdense vessel or unexpected calcification. Skull: Normal. Negative for fracture or focal lesion. Sinuses/Orbits: No acute finding. Other: None. IMPRESSION: No acute intracranial abnormalities identified. Electronically Signed   By: Dorise Bullion III M.D   On: 01/23/2019 18:30   Ct Abdomen Pelvis W Contrast  Result Date: 01/22/2019 CLINICAL DATA:  Lower abdominal pain for 2 weeks EXAM: CT ABDOMEN AND PELVIS WITH CONTRAST TECHNIQUE: Multidetector CT imaging of the abdomen and pelvis was performed using the standard protocol following bolus administration of intravenous contrast. CONTRAST:  120m OMNIPAQUE IOHEXOL 300 MG/ML  SOLN COMPARISON:  12/23/2018 FINDINGS: Lower chest: Lung bases are well aerated with some minimal scarring bilaterally. Hepatobiliary: No focal liver abnormality is seen. No gallstones, gallbladder wall thickening, or biliary dilatation. Pancreas: Unremarkable. No pancreatic ductal dilatation or surrounding inflammatory changes. Spleen: Normal in size without focal abnormality. Adrenals/Urinary Tract: Adrenal glands are within normal limits. Kidneys demonstrate a normal enhancement pattern bilaterally. Tiny nonobstructing right lower pole renal stone is noted. Normal excretion of contrast is noted on delayed images. The bladder is well distended.  Stomach/Bowel: Diverticular change of the colon is noted without evidence of diverticulitis. No obstructive or inflammatory changes are seen. The appendix is air-filled and within normal limits. No small bowel abnormality is noted. Stomach is unremarkable with the exception of a small sliding-type hiatal hernia. Vascular/Lymphatic: Aortic atherosclerosis. No enlarged abdominal or pelvic lymph nodes. Reproductive: Uterus and bilateral adnexa are unremarkable. Other: No abdominal wall hernia or abnormality. No abdominopelvic ascites. Musculoskeletal: Chronic  anterolisthesis of L5 on S1 is seen. Disc space narrowing at L4-5 is noted. IMPRESSION: Tiny nonobstructing right lower pole renal stone. Diverticulosis without diverticulitis. No acute abnormality noted. Electronically Signed   By: Inez Catalina M.D.   On: 01/22/2019 21:35   Dg Humerus Right  Result Date: 02/05/2019 CLINICAL DATA:  73 year old female with right arm pain. No known injury. Evaluate for possible infection. EXAM: RIGHT ELBOW - COMPLETE 3+ VIEW; RIGHT HUMERUS - 2+ VIEW COMPARISON:  Right shoulder radiograph dated 05/19/2018 FINDINGS: No definite acute fracture identified. Small bone fragments adjacent to the lateral epicondyle as well as at the tip of the coronoid process most likely chronic. There is however elevation of the anterior fat pad indicative of joint effusion. An occult fracture or an infectious process is not excluded. Clinical correlation is recommended. No periosteal elevation or bone erosion to suggest acute osteomyelitis by radiograph. The soft tissues are unremarkable. No radiopaque foreign object or soft tissue gas. IMPRESSION: 1. No definite acute fracture.  No dislocation. 2. Elevation of the anterior fat pad of the elbow indicative of joint effusion. Clinical correlation is recommended. Electronically Signed   By: Anner Crete M.D.   On: 02/05/2019 21:09   Dg Hand Complete Right  Result Date: 02/05/2019 CLINICAL DATA:   Acute onset of right hand pain, with thumb swelling and erythema. EXAM: RIGHT HAND - COMPLETE 3+ VIEW COMPARISON:  None. FINDINGS: There is no evidence of fracture or dislocation. Mild degenerative change is noted at the distal interphalangeal joints, concerning for mild osteoarthritis. The carpal rows are intact, and demonstrate normal alignment. Mild calcification is noted at the triangular fibrocartilage. Mild soft tissue swelling is noted about the thumb. The thumb is otherwise grossly unremarkable. IMPRESSION: 1. No evidence of fracture or dislocation. 2. Mild soft tissue swelling about the thumb; thumb otherwise unremarkable. 3. Mild degenerative change at the distal interphalangeal joints, concerning for mild osteoarthritis. Electronically Signed   By: Garald Balding M.D.   On: 02/05/2019 21:05   Dg Foot Complete Right  Result Date: 02/05/2019 CLINICAL DATA:  Right foot and ankle pain and swelling. EXAM: RIGHT FOOT COMPLETE - 3+ VIEW COMPARISON:  None. FINDINGS: There is no evidence of fracture or dislocation. Mild hammertoe deformity of the digits. Plantar calcaneal spur. No bony destructive change. Generalized soft tissue edema. No soft tissue air. No radiopaque foreign body. IMPRESSION: Generalized soft tissue edema. No acute osseous abnormality or findings of osteomyelitis. Electronically Signed   By: Keith Rake M.D.   On: 02/05/2019 21:08     CBC Recent Labs  Lab 02/05/19 2000 02/06/19 0718 02/07/19 0648  WBC 11.1* 9.2 6.9  HGB 10.6* 10.1* 9.5*  HCT 32.1* 31.5* 30.6*  PLT 317 266 265  MCV 95.8 98.7 100.7*  MCH 31.6 31.7 31.3  MCHC 33.0 32.1 31.0  RDW 12.2 12.3 12.3  LYMPHSABS 1.8  --   --   MONOABS 1.9*  --   --   EOSABS 0.0  --   --   BASOSABS 0.0  --   --     Chemistries  Recent Labs  Lab 02/05/19 2000 02/06/19 0718 02/07/19 0648  NA 135 138 140  K 2.7* 3.3* 3.1*  CL 98 104 106  CO2 25 26 27   GLUCOSE 109* 105* 105*  BUN 9 5* 13  CREATININE 0.76 0.64 0.68    CALCIUM 9.3 8.7* 8.9  MG  --  2.4  --   AST 16 14*  --   ALT  10 9  --   ALKPHOS 66 55  --   BILITOT 1.4* 1.1  --    ------------------------------------------------------------------------------------------------------------------ No results for input(s): CHOL, HDL, LDLCALC, TRIG, CHOLHDL, LDLDIRECT in the last 72 hours.  No results found for: HGBA1C ------------------------------------------------------------------------------------------------------------------ No results for input(s): TSH, T4TOTAL, T3FREE, THYROIDAB in the last 72 hours.  Invalid input(s): FREET3 ------------------------------------------------------------------------------------------------------------------ No results for input(s): VITAMINB12, FOLATE, FERRITIN, TIBC, IRON, RETICCTPCT in the last 72 hours.  Coagulation profile No results for input(s): INR, PROTIME in the last 168 hours.  No results for input(s): DDIMER in the last 72 hours.  Cardiac Enzymes No results for input(s): CKMB, TROPONINI, MYOGLOBIN in the last 168 hours.  Invalid input(s): CK ------------------------------------------------------------------------------------------------------------------ No results found for: BNP   Roxan Hockey M.D on 02/07/2019 at 5:42 PM  Go to [email or web address] - for contact info  [facility] - Office  [phone]

## 2019-02-08 LAB — PROTEIN, BODY FLUID (OTHER): Total Protein, Body Fluid Other: 4.8 g/dL

## 2019-02-08 LAB — SEDIMENTATION RATE: Sed Rate: 95 mm/hr — ABNORMAL HIGH (ref 0–22)

## 2019-02-08 MED ORDER — LORAZEPAM 1 MG PO TABS
1.0000 mg | ORAL_TABLET | Freq: Three times a day (TID) | ORAL | Status: DC | PRN
  Administered 2019-02-09: 1 mg via ORAL
  Filled 2019-02-08 (×2): qty 1

## 2019-02-08 NOTE — Progress Notes (Signed)
Patient Demographics:    Wendy Mccann, is a 73 y.o. female, DOB - Jun 26, 1946, PHX:[id number]  Admit date - 02/05/2019   Admitting Physician Oswald Hillock, MD  Outpatient Primary MD for the patient is [place], [place]  LOS - 2   Chief Complaint  Patient presents with   Hallucinations        Subjective:    Wendy Mccann today has no fevers, no emesis,  No chest pain, Rt elbow  Swelling and ROM improving  Assessment  & Plan :    Principal Problem:   Rt Elbow Arthritis Active Problems:   Generalized weakness   Normocytic anemia   Dementia without behavioral disturbance ([place])  Brief Summary:- 73 y.o.female,with history of dementia, anemia,  admitted on 02/06/2019 with concerns about right elbow septic arthritis--     A/p 1)Rt Elbow Arthritis--- septic versus inflammatory, fluid aspirate  Cell count 39k with 81 % PMN, no crystals ESR--- up to 95, Uric 4.2 ---Joint fluid culture NGTD --Joint fluid Gram stain negative --WBC is down to 6.9 from 11.1 -Discussed with Dr Griffin Basil---   stopped IV antibiotics (IV Rocephin and IV vancomycin ) on 02/07/2019---- Dr Griffin Basil states this may be inflammatory arthritis, states to observe patient for at least 24 to 36 hours after stopping IV antibiotics--- if symptoms continue to improve with anti-inflammatory agents then no need for Ortho follow-up --Continue indomethacin and colchicine --If symptoms worsen after stopping IV antibiotics then referred to orthopedics for possible right elbow joint washout  2)Dementia--without behavioral disturbance patient with poor insight and cognitive deficits  ---RPRis nonreactiveand TSH is 2.3, CT head without acute findings, c/n Aricept, B12 borderline low,receivedIM B12 shot on 01/25/2019, next dose 02/25/2019  3) B12 deficiency--- continue B12 injections monthly Next dose due  02/25/2019  Disposition/Need for in-Hospital Stay- patient unable to be discharged at this time due to patient with septic arthritis currently on IV antibiotic and anti-inflammatory agents  Code Status : Full   Family Communication: Discussed with sister [name] ([phone]  Disposition Plan  : TBD  Consults  : Full consult with orthopedic surgeon Dr. Griffin Basil on 02/07/2019  DVT Prophylaxis  : Lovenox- SCDs   Lab Results  Component Value Date   PLT 265 02/07/2019    Inpatient Medications  Scheduled Meds:  donepezil  5 mg Oral QHS   enoxaparin (LOVENOX) injection  40 mg Subcutaneous Q24H   ferrous sulfate  325 mg Oral Q breakfast   indomethacin  50 mg Oral BID WC   QUEtiapine  25 mg Oral QHS   senna-docusate  2 tablet Oral QHS   Continuous Infusions:  sodium chloride 40 mL/hr at 02/08/19 1137   PRN Meds:.acetaminophen, LORazepam, ondansetron **OR** ondansetron (ZOFRAN) IV, oxyCODONE, simethicone   Anti-infectives (From admission, onward)   Start     Dose/Rate Route Frequency Ordered Stop   02/07/19 0500  cefTRIAXone (ROCEPHIN) 2 g in sodium chloride 0.9 % 100 mL IVPB  Status:  Discontinued     2 g 200 mL/hr over 30 Minutes Intravenous Every 24 hours 02/06/19 1102 02/06/19 1349   02/06/19 1600  vancomycin (VANCOCIN) 500 mg in sodium chloride 0.9 % 100 mL IVPB     500 mg 100 mL/hr over 60 Minutes  Intravenous Every 12 hours 02/06/19 0338 02/07/19 2359   02/06/19 1600  cefTRIAXone (ROCEPHIN) 2 g in sodium chloride 0.9 % 100 mL IVPB     2 g 200 mL/hr over 30 Minutes Intravenous Every 24 hours 02/06/19 1349 02/07/19 1550   02/06/19 0430  vancomycin (VANCOCIN) IVPB 750 mg/150 ml premix     750 mg 150 mL/hr over 60 Minutes Intravenous  Once 02/06/19 0338 02/06/19 0535   02/06/19 0200  cefTRIAXone (ROCEPHIN) 1 g in sodium chloride 0.9 % 100 mL IVPB  Status:  Discontinued     1 g 200 mL/hr over 30 Minutes Intravenous Every 24 hours 02/06/19 0152 02/06/19  1102   02/06/19 0130  vancomycin (VANCOCIN) IVPB 750 mg/150 ml premix     750 mg 150 mL/hr over 60 Minutes Intravenous Every 1 hr x 2 02/06/19 0112 02/06/19 0329       Objective:   Vitals:   02/07/19 1335 02/07/19 2210 02/08/19 0626 02/08/19 1516  BP: (!) 112/59 (!) 113/52 (!) 98/51 (!) 94/47  Pulse: 77 68 68 60  Resp: 17 16 18 16   Temp:  98.4 F (36.9 C) 98.4 F (36.9 C) 97.8 F (36.6 C)  TempSrc:  Oral Oral Oral  SpO2: 100% 100% 98% 100%  Weight:      Height:        Wt Readings from Last 3 Encounters:  02/05/19 63.5 kg  01/28/19 60.6 kg  01/22/19 62.8 kg    Intake/Output Summary (Last 24 hours) at 02/08/2019 1612 Last data filed at 02/08/2019 0600 Gross per 24 hour  Intake 240 ml  Output 200 ml  Net 40 ml   Physical Exam  Gen:- Awake Alert,  In no apparent distress  HEENT:- [place].AT, No sclera icterus Neck-Supple Neck,No JVD,.  Lungs-  CTAB , fair symmetrical air movement CV- S1, S2 normal, regular  Abd-  +ve B.Sounds, Abd Soft, No tenderness,    Extremity/Skin:- No  edema, pedal pulses present  Psych-affect is appropriate, some cognitive deficits  neuro-generalized weakness, no new focal deficits, no tremors MSK--- improving  right elbow swelling, warmth and  tenderness , right elbow range of motion is improving--- no streaking  Data Review:   Micro Results Recent Results (from the past 240 hour(s))  Body fluid culture     Status: None (Preliminary result)   Collection Time: 02/05/19  9:33 PM   Specimen: Synovium; Body Fluid  Result Value Ref Range Status   Specimen Description   Final    SYNOVIAL Performed at [facility], [address], [place], [place] [address]    Special Requests   Final    R ELBOW Performed at [facility], [address] [place], [place] [address]    Gram Stain   Final    MODERATE WBC PRESENT,BOTH PMN AND MONONUCLEAR NO ORGANISMS SEEN    Culture   Final    NO GROWTH 2 DAYS Performed at [facility], [place] [address] [place], [place] [address]    Report Status PENDING  Incomplete  Gram stain     Status: None   Collection Time: 02/05/19  9:56 PM   Specimen: Synovium; Synovial Fluid  Result Value Ref Range Status   Specimen Description SYNOVIAL  Final   Special Requests RIGHT ELBOW  Final   Gram Stain   Final    ABUNDANT POLYMORPHONUCLEAR NO ORGANISMS SEEN Performed at [facility], [address], [place], [place] [address]    Report Status 02/05/2019 FINAL  Final  SARS Coronavirus 2 (CEPHEID - Performed in [place] hospital lab), Hosp Order     Status: None   Collection Time: 02/05/19 11:09 PM   Specimen: Nasopharyngeal Swab  Result Value Ref Range Status   SARS Coronavirus 2 NEGATIVE NEGATIVE Final    Comment: (NOTE) If result is NEGATIVE SARS-CoV-2 target nucleic acids are NOT DETECTED. The SARS-CoV-2 RNA is generally detectable in upper and lower  respiratory specimens during the acute phase of infection. The lowest  concentration of SARS-CoV-2 viral copies this assay can detect is 250  copies / mL. A negative result does not preclude SARS-CoV-2 infection  and should not be used as the sole basis for treatment or other  patient management decisions.  A negative result may occur with  improper specimen collection / handling, submission of specimen other  than nasopharyngeal swab, presence of viral mutation(s) within the  areas targeted by this assay, and inadequate number of viral copies  (<250 copies / mL). A negative result must be combined with clinical  observations, patient history, and epidemiological information. If result is POSITIVE SARS-CoV-2 target nucleic acids are DETECTED. The SARS-CoV-2 RNA is generally detectable in upper and lower  respiratory specimens dur ing the acute phase of infection.  Positive  results are indicative of active infection with SARS-CoV-2.  Clinical  correlation with patient history and other diagnostic information is  necessary to determine  patient infection status.  Positive results do  not rule out bacterial infection or co-infection with other viruses. If result is PRESUMPTIVE POSTIVE SARS-CoV-2 nucleic acids MAY BE PRESENT.   A presumptive positive result was obtained on the submitted specimen  and confirmed on repeat testing.  While 2019 novel coronavirus  (SARS-CoV-2) nucleic acids may be present in the submitted sample  additional confirmatory testing may be necessary for epidemiological  and / or clinical management purposes  to differentiate between  SARS-CoV-2 and other Sarbecovirus currently known to infect humans.  If clinically indicated additional testing with an alternate test  methodology [id number]) is advised. The SARS-CoV-2 RNA is generally  detectable in upper and lower respiratory sp ecimens during the acute  phase of infection. The expected result is Negative. Fact Sheet for Patients:  StrictlyIdeas.no Fact Sheet for Healthcare Providers: BankingDealers.co.za This test is not yet approved or cleared by the [place] FDA and has been authorized for detection and/or diagnosis of SARS-CoV-2 by FDA under an Emergency Use Authorization (EUA).  This EUA will remain in effect (meaning this test can be used) for the duration of the COVID-19 declaration under Section 564(b)(1) of the Act, 21 U.S.C. section 360bbb-3(b)(1), unless the authorization is terminated or revoked sooner. Performed at [facility], [address] [place], [place] [address]     Radiology Reports Dg Chest 1 View  Result Date: 02/05/2019 CLINICAL DATA:  Acute onset of right arm pain and hallucinations. Evaluate for infection. EXAM: CHEST  1 VIEW COMPARISON:  Chest radiograph performed 08/12/2016 FINDINGS: The lungs are mildly hypoexpanded. Mild vascular congestion is noted. There is no evidence of focal opacification, pleural effusion or pneumothorax. The cardiomediastinal silhouette is  within normal limits. No acute osseous abnormalities are seen. IMPRESSION: Lungs mildly hypoexpanded but grossly clear. Mild vascular congestion noted. Electronically Signed   By: Garald Balding M.D.   On: 02/05/2019 21:03   Dg Elbow Complete Right  Result Date: 02/05/2019 CLINICAL DATA:  73 year old female with right arm pain. No known injury. Evaluate for possible infection. EXAM: RIGHT ELBOW - COMPLETE  3+ VIEW; RIGHT HUMERUS - 2+ VIEW COMPARISON:  Right shoulder radiograph dated 05/19/2018 FINDINGS: No definite acute fracture identified. Small bone fragments adjacent to the lateral epicondyle as well as at the tip of the coronoid process most likely chronic. There is however elevation of the anterior fat pad indicative of joint effusion. An occult fracture or an infectious process is not excluded. Clinical correlation is recommended. No periosteal elevation or bone erosion to suggest acute osteomyelitis by radiograph. The soft tissues are unremarkable. No radiopaque foreign object or soft tissue gas. IMPRESSION: 1. No definite acute fracture.  No dislocation. 2. Elevation of the anterior fat pad of the elbow indicative of joint effusion. Clinical correlation is recommended. Electronically Signed   By: Anner Crete M.D.   On: 02/05/2019 21:09   Dg Ankle Complete Right  Result Date: 02/05/2019 CLINICAL DATA:  Right foot and ankle pain and swelling. EXAM: RIGHT ANKLE - COMPLETE 3+ VIEW COMPARISON:  None. FINDINGS: Single screw traverses the distal fibula. Advanced tibial talar joint space narrowing with osseous remodeling, subchondral cystic change and sclerosis. Mild bony fragmentation of the medial malleolus. No definite tibial talar joint effusion. There is a small plantar calcaneal spur. No bony destructive change. Moderate soft tissue edema about the ankle. No soft tissue air. No radiopaque foreign body. IMPRESSION: 1. Moderate soft tissue edema about the ankle. No soft tissue air or radiopaque  foreign body. No radiographic findings of osteomyelitis. 2. Advanced tibial talar osteoarthritis. Electronically Signed   By: Keith Rake M.D.   On: 02/05/2019 21:06   Ct Head Wo Contrast  Result Date: 01/23/2019 CLINICAL DATA:  Metabolic encephalopathy. EXAM: CT HEAD WITHOUT CONTRAST TECHNIQUE: Contiguous axial images were obtained from the base of the skull through the vertex without intravenous contrast. COMPARISON:  None. FINDINGS: Brain: No evidence of acute infarction, hemorrhage, hydrocephalus, extra-axial collection or mass lesion/mass effect. Vascular: No hyperdense vessel or unexpected calcification. Skull: Normal. Negative for fracture or focal lesion. Sinuses/Orbits: No acute finding. Other: None. IMPRESSION: No acute intracranial abnormalities identified. Electronically Signed   By: Dorise Bullion III M.D   On: 01/23/2019 18:30   Ct Abdomen Pelvis W Contrast  Result Date: 01/22/2019 CLINICAL DATA:  Lower abdominal pain for 2 weeks EXAM: CT ABDOMEN AND PELVIS WITH CONTRAST TECHNIQUE: Multidetector CT imaging of the abdomen and pelvis was performed using the standard protocol following bolus administration of intravenous contrast. CONTRAST:  170m OMNIPAQUE IOHEXOL 300 MG/ML  SOLN COMPARISON:  12/23/2018 FINDINGS: Lower chest: Lung bases are well aerated with some minimal scarring bilaterally. Hepatobiliary: No focal liver abnormality is seen. No gallstones, gallbladder wall thickening, or biliary dilatation. Pancreas: Unremarkable. No pancreatic ductal dilatation or surrounding inflammatory changes. Spleen: Normal in size without focal abnormality. Adrenals/Urinary Tract: Adrenal glands are within normal limits. Kidneys demonstrate a normal enhancement pattern bilaterally. Tiny nonobstructing right lower pole renal stone is noted. Normal excretion of contrast is noted on delayed images. The bladder is well distended. Stomach/Bowel: Diverticular change of the colon is noted without evidence  of diverticulitis. No obstructive or inflammatory changes are seen. The appendix is air-filled and within normal limits. No small bowel abnormality is noted. Stomach is unremarkable with the exception of a small sliding-type hiatal hernia. Vascular/Lymphatic: Aortic atherosclerosis. No enlarged abdominal or pelvic lymph nodes. Reproductive: Uterus and bilateral adnexa are unremarkable. Other: No abdominal wall hernia or abnormality. No abdominopelvic ascites. Musculoskeletal: Chronic anterolisthesis of L5 on S1 is seen. Disc space narrowing at L4-5 is noted. IMPRESSION: Tiny nonobstructing right  lower pole renal stone. Diverticulosis without diverticulitis. No acute abnormality noted. Electronically Signed   By: Inez Catalina M.D.   On: 01/22/2019 21:35   Dg Humerus Right  Result Date: 02/05/2019 CLINICAL DATA:  73 year old female with right arm pain. No known injury. Evaluate for possible infection. EXAM: RIGHT ELBOW - COMPLETE 3+ VIEW; RIGHT HUMERUS - 2+ VIEW COMPARISON:  Right shoulder radiograph dated 05/19/2018 FINDINGS: No definite acute fracture identified. Small bone fragments adjacent to the lateral epicondyle as well as at the tip of the coronoid process most likely chronic. There is however elevation of the anterior fat pad indicative of joint effusion. An occult fracture or an infectious process is not excluded. Clinical correlation is recommended. No periosteal elevation or bone erosion to suggest acute osteomyelitis by radiograph. The soft tissues are unremarkable. No radiopaque foreign object or soft tissue gas. IMPRESSION: 1. No definite acute fracture.  No dislocation. 2. Elevation of the anterior fat pad of the elbow indicative of joint effusion. Clinical correlation is recommended. Electronically Signed   By: Anner Crete M.D.   On: 02/05/2019 21:09   Dg Hand Complete Right  Result Date: 02/05/2019 CLINICAL DATA:  Acute onset of right hand pain, with thumb swelling and erythema. EXAM:  RIGHT HAND - COMPLETE 3+ VIEW COMPARISON:  None. FINDINGS: There is no evidence of fracture or dislocation. Mild degenerative change is noted at the distal interphalangeal joints, concerning for mild osteoarthritis. The carpal rows are intact, and demonstrate normal alignment. Mild calcification is noted at the triangular fibrocartilage. Mild soft tissue swelling is noted about the thumb. The thumb is otherwise grossly unremarkable. IMPRESSION: 1. No evidence of fracture or dislocation. 2. Mild soft tissue swelling about the thumb; thumb otherwise unremarkable. 3. Mild degenerative change at the distal interphalangeal joints, concerning for mild osteoarthritis. Electronically Signed   By: Garald Balding M.D.   On: 02/05/2019 21:05   Dg Foot Complete Right  Result Date: 02/05/2019 CLINICAL DATA:  Right foot and ankle pain and swelling. EXAM: RIGHT FOOT COMPLETE - 3+ VIEW COMPARISON:  None. FINDINGS: There is no evidence of fracture or dislocation. Mild hammertoe deformity of the digits. Plantar calcaneal spur. No bony destructive change. Generalized soft tissue edema. No soft tissue air. No radiopaque foreign body. IMPRESSION: Generalized soft tissue edema. No acute osseous abnormality or findings of osteomyelitis. Electronically Signed   By: Keith Rake M.D.   On: 02/05/2019 21:08     CBC Recent Labs  Lab 02/05/19 2000 02/06/19 0718 02/07/19 0648  WBC 11.1* 9.2 6.9  HGB 10.6* 10.1* 9.5*  HCT 32.1* 31.5* 30.6*  PLT 317 266 265  MCV 95.8 98.7 100.7*  MCH 31.6 31.7 31.3  MCHC 33.0 32.1 31.0  RDW 12.2 12.3 12.3  LYMPHSABS 1.8  --   --   MONOABS 1.9*  --   --   EOSABS 0.0  --   --   BASOSABS 0.0  --   --     Chemistries  Recent Labs  Lab 02/05/19 2000 02/06/19 0718 02/07/19 0648  NA 135 138 140  K 2.7* 3.3* 3.1*  CL 98 104 106  CO2 25 26 27   GLUCOSE 109* 105* 105*  BUN 9 5* 13  CREATININE 0.76 0.64 0.68  CALCIUM 9.3 8.7* 8.9  MG  --  2.4  --   AST 16 14*  --   ALT 10 9  --    ALKPHOS 66 55  --   BILITOT 1.4* 1.1  --    ------------------------------------------------------------------------------------------------------------------  No results for input(s): CHOL, HDL, LDLCALC, TRIG, CHOLHDL, LDLDIRECT in the last 72 hours.  No results found for: HGBA1C ------------------------------------------------------------------------------------------------------------------ No results for input(s): TSH, T4TOTAL, T3FREE, THYROIDAB in the last 72 hours.  Invalid input(s): FREET3 ------------------------------------------------------------------------------------------------------------------ No results for input(s): VITAMINB12, FOLATE, FERRITIN, TIBC, IRON, RETICCTPCT in the last 72 hours.  Coagulation profile No results for input(s): INR, PROTIME in the last 168 hours.  No results for input(s): DDIMER in the last 72 hours.  Cardiac Enzymes No results for input(s): CKMB, TROPONINI, MYOGLOBIN in the last 168 hours.  Invalid input(s): CK ------------------------------------------------------------------------------------------------------------------ No results found for: BNP   Roxan Hockey M.D on 02/08/2019 at 4:12 PM  Go to [email or web address] - for contact info  [facility] - Office  [phone]

## 2019-02-09 LAB — CBC
HCT: 30 % — ABNORMAL LOW (ref 36.0–46.0)
Hemoglobin: 9.6 g/dL — ABNORMAL LOW (ref 12.0–15.0)
MCH: 31.3 pg (ref 26.0–34.0)
MCHC: 32 g/dL (ref 30.0–36.0)
MCV: 97.7 fL (ref 80.0–100.0)
Platelets: 325 10*3/uL (ref 150–400)
RBC: 3.07 MIL/uL — ABNORMAL LOW (ref 3.87–5.11)
RDW: 12 % (ref 11.5–15.5)
WBC: 4.9 10*3/uL (ref 4.0–10.5)
nRBC: 0 % (ref 0.0–0.2)

## 2019-02-09 LAB — BODY FLUID CULTURE: Culture: NO GROWTH

## 2019-02-09 LAB — ANTINUCLEAR ANTIBODIES, IFA: ANA Ab, IFA: POSITIVE — AB

## 2019-02-09 LAB — SEDIMENTATION RATE: Sed Rate: 94 mm/hr — ABNORMAL HIGH (ref 0–22)

## 2019-02-09 LAB — C-REACTIVE PROTEIN: CRP: 12.7 mg/dL — ABNORMAL HIGH (ref <1.0)

## 2019-02-09 LAB — BASIC METABOLIC PANEL
Anion gap: 8 (ref 5–15)
BUN: 6 mg/dL — ABNORMAL LOW (ref 8–23)
CO2: 25 mmol/L (ref 22–32)
Calcium: 8.8 mg/dL — ABNORMAL LOW (ref 8.9–10.3)
Chloride: 106 mmol/L (ref 98–111)
Creatinine, Ser: 0.56 mg/dL (ref 0.44–1.00)
GFR calc Af Amer: >60 mL/min (ref >60)
GFR calc non Af Amer: >60 mL/min (ref >60)
Glucose, Bld: 81 mg/dL (ref 70–99)
Potassium: 3.2 mmol/L — ABNORMAL LOW (ref 3.5–5.1)
Sodium: 139 mmol/L (ref 135–145)

## 2019-02-09 LAB — FANA STAINING PATTERNS
Nuclear Dot Pattern: 1:1280 {titer} — ABNORMAL HIGH
Nucleolar Pattern: 1:320 {titer} — ABNORMAL HIGH

## 2019-02-09 MED ORDER — OXYCODONE HCL 5 MG PO TABS: 5.0000 mg | ORAL_TABLET | ORAL | 0 refills | Status: DC | PRN

## 2019-02-09 MED ORDER — INDOMETHACIN 50 MG PO CAPS: 50.0000 mg | ORAL_CAPSULE | Freq: Two times a day (BID) | ORAL | 0 refills | Status: AC

## 2019-02-09 MED ORDER — LORAZEPAM 0.5 MG PO TABS: 0.5000 mg | ORAL_TABLET | Freq: Two times a day (BID) | ORAL | 0 refills | Status: DC | PRN

## 2019-02-09 MED ORDER — POTASSIUM CHLORIDE CRYS ER 20 MEQ PO TBCR
40.0000 meq | EXTENDED_RELEASE_TABLET | ORAL | Status: AC
  Administered 2019-02-09 (×2): 40 meq via ORAL
  Filled 2019-02-09 (×2): qty 2

## 2019-02-09 NOTE — Evaluation (Signed)
Physical Therapy Evaluation Patient Details Name: Wendy Mccann MRN: [id number] DOB: [birth year] Today's Date: 02/09/2019   History of Present Illness  Wendy Mccann  is a 73 y.o. female, with history of dementia, anemia, hypokalemia was brought to hospital with complaints of hallucinations for past 1 week.  When patient told her family members about hallucinations she was brought to the hospital for further evaluation.  Patient was found to have swollen joints including right wrist, elbow and right ankle.  Arthrocentesis was performed on right elbow joint and fluid analysis showed 39,000 WBC with 81% PMNs.  Gram stain is negative.  Crystals were negative.She denies nausea vomiting or diarrheaDenies chest pain or shortness of breathDenies fever or chills    Clinical Impression  Patient functioning near baseline for functional mobility and gait, demonstrates good return for bed/chair transfers and walking short distances in room without using AD, safer using RW for longer distances.  Plan:  Patient discharged from physical therapy to care of nursing for ambulation daily as tolerated for length of stay.     Follow Up Recommendations Home health PT    Equipment Recommendations  None recommended by PT    Recommendations for Other Services       Precautions / Restrictions Precautions Precautions: Fall Restrictions Weight Bearing Restrictions: No      Mobility  Bed Mobility Overal bed mobility: Modified Independent             General bed mobility comments: increased time  Transfers Overall transfer level: Modified independent Equipment used: Rolling walker (2 wheeled);None             General transfer comment: increased time  Ambulation/Gait Ambulation/Gait assistance: Supervision Gait Distance (Feet): 120 Feet Assistive device: Rolling walker (2 wheeled) Gait Pattern/deviations: Decreased step length - right;Decreased step length - left;Decreased stride  length Gait velocity: decreased   General Gait Details: slightly labored cadence with tendency to lean on nearby objects for support when not using AD, patient requested to use RW and demonstrates good carryover for use without loss of balance  Stairs            Wheelchair Mobility    Modified Rankin (Stroke Patients Only)       Balance Overall balance assessment: Needs assistance Sitting-balance support: Feet supported;No upper extremity supported Sitting balance-Leahy Scale: Good     Standing balance support: During functional activity;No upper extremity supported Standing balance-Leahy Scale: Fair Standing balance comment: fair/good using RW                             Pertinent Vitals/Pain Pain Assessment: No/denies pain    Home Living Family/patient expects to be discharged to:: Private residence Living Arrangements: Alone Available Help at Discharge: Family;Available 24 hours/day Type of Home: House Home Access: Stairs to enter Entrance Stairs-Rails: Left Entrance Stairs-Number of Steps: 2 Home Layout: One level Home Equipment: Shower seat;Bedside commode;Walker - 2 wheels      Prior Function Level of Independence: Independent with assistive device(s)         Comments: Household and short distanced [employer]        Extremity/Trunk Assessment   Upper Extremity Assessment Upper Extremity Assessment: Overall WFL for tasks assessed    Lower Extremity Assessment Lower Extremity Assessment: Generalized weakness    Cervical / Trunk Assessment Cervical / Trunk Assessment: Normal  Communication   Communication: No difficulties  Cognition Arousal/Alertness: Awake/alert  Behavior During Therapy: WFL for tasks assessed/performed Overall Cognitive Status: Within Functional Limits for tasks assessed                                        General Comments      Exercises      Assessment/Plan    PT Assessment All further PT needs can be met in the next venue of care  PT Problem List Decreased strength;Decreased activity tolerance;Decreased balance;Decreased mobility       PT Treatment Interventions      PT Goals (Current goals can be found in the Care Plan section)  Acute Rehab PT Goals Patient Stated Goal: return home with family to assist PT Goal Formulation: With patient Time For Goal Achievement: 02/09/19 Potential to Achieve Goals: Good    Frequency     Barriers to discharge        Co-evaluation               AM-PAC PT "6 Clicks" Mobility  Outcome Measure Help needed turning from your back to your side while in a flat bed without using bedrails?: None Help needed moving from lying on your back to sitting on the side of a flat bed without using bedrails?: None Help needed moving to and from a bed to a chair (including a wheelchair)?: None Help needed standing up from a chair using your arms (e.g., wheelchair or bedside chair)?: None Help needed to walk in hospital room?: A Little Help needed climbing 3-5 steps with a railing? : A Little 6 Click Score: 22    End of Session   Activity Tolerance: Patient tolerated treatment well;Patient limited by fatigue Patient left: in chair;with call bell/phone within reach Nurse Communication: Mobility status PT Visit Diagnosis: Unsteadiness on feet (R26.81);Other abnormalities of gait and mobility (R26.89);Muscle weakness (generalized) (M62.81)    Time: 0034-9179 PT Time Calculation (min) (ACUTE ONLY): 22 min   Charges:   PT Evaluation $PT Eval Moderate Complexity: 1 Mod PT Treatments $Gait Training: 8-22 mins        12:29 PM, 02/09/19 Lonell Grandchild, MPT Physical Therapist with [facility] [phone] office [phone] mobile phone

## 2019-02-09 NOTE — Discharge Summary (Signed)
Wendy Mccann, is a 73 y.o. female  DOB Jan 30, 1946  MRN [id number].  Admission date:  02/05/2019  Admitting Physician  Oswald Hillock, MD  Discharge Date:  02/09/2019   Primary MD  Pllc, [facility]  Recommendations for primary care physician for things to follow:   1) you have inflammatory arthritis of the right elbow- [facility] [address], [place], [place], [place] [address] Phone [phone] 2) take medications as prescribed 3) family members should initiate guardianship process--- as patient has poor insight, poor cognitive and executive functioning and poor decision-making due to dementia   Admission Diagnosis  Hypokalemia [E87.6] Visual hallucinations [R44.1] Effusion of right elbow [M25.421]   Discharge Diagnosis  Hypokalemia [E87.6] Visual hallucinations [R44.1] Effusion of right elbow [M25.421]   Principal Problem:   Rt Elbow Arthritis Active Problems:   Generalized weakness   Normocytic anemia   Dementia without behavioral disturbance ([place])      Past Medical History:  Diagnosis Date   Alzheimer's dementia ([place])    Anemia    Medical history non-contributory    Rotator cuff (capsule) sprain     Past Surgical History:  Procedure Laterality Date   ankles       HPI  from the history and physical done on the day of admission:    Wendy Mccann  is a 73 y.o. female, with history of dementia, anemia, hypokalemia was brought to hospital with complaints of hallucinations for past 1 week.  When patient told her family members about hallucinations she was brought to the hospital for further evaluation.  Patient was found to have swollen joints including right wrist, elbow and right ankle.  Arthrocentesis was performed on right elbow joint and fluid analysis showed 39,000 WBC with 81% PMNs.  Gram  stain is negative.  Crystals were negative. She denies nausea vomiting or diarrhea Denies chest pain or shortness of breath Denies fever or chills     Hospital Course:   Brief Summary:- 73 y.o.female,with history of dementia, anemia,  admitted on 02/06/2019 with concerns about right elbow inflammatory versus septic arthritis--    A/p 1)Rt Elbow Arthritis--- septic versus inflammatory, fluid aspirate  Cell count 39k with 81 % PMN, no crystals ESR--- up to 95, Uric 4.2, CRP and rheumatoid factor elevated ---Joint fluid culture NGTD --Joint fluid Gram stain negative --WBC is down to 4.9 from 11.1 -Discussed with Dr Griffin Basil---   stopped IV antibiotics (IV Rocephin and IV vancomycin ) on 02/07/2019---- Dr Griffin Basil states this is probably inflammatory arthritis, states to observe patient for at least 24 to 36 hours after stopping IV antibiotics--  --Treated with indomethacin and colchicine --Symptoms did not worsen after stopping IV antibiotics - --- After stopping antibiotics patient's right elbow joint continues to improve significantly, patient has no fevers, right elbow joint swelling warmth tenderness and discomfort has improved significantly range of motion is back to normal --- Rheumatoid factor is elevated --Discharge on indomethacin -- [facility] for further work-up of patient's inflammatory arthritis  2)Dementia--without behavioral disturbance patient with poor insight and cognitive deficits  ---RPRis nonreactiveand TSH is 2.3, CT head without acute findings, c/n Aricept, B12 borderline low,receivedIM B12 shot on 01/25/2019,next dose 02/25/2019  3) B12 deficiency--- continue B12 injections monthly Next dose due 02/25/2019  Disposition--patient lives alone, she has cognitive deficits due to underlying dementia, discussed with patient's sister and patient's brother, they will try to go downtown and get  guardianship --in the meantime patient's brother and sister will continue to check on her from time to time there will try to get additional help for patient at home, patient will be discharged home with home health RN, home health social worker and home health PT   Code Status : Full   Family Communication: Discussed with sister [name] ([phone] and Brother Legrand Como   Consults  : Phone consult with orthopedic surgeon Dr. Griffin Basil on 02/07/2019   Discharge Condition: stable  Follow UP  Follow-up Information    Rosita Fire, MD Follow up.   Specialty: Internal Medicine Contact information: [place] [address] [phone]        Lahoma Rocker, MD.   Specialty: Rheumatology Why: Will see Dr. Theda Sers the office will call with an appointment Contact information: [address] [place] [address] [phone]          Diet and Activity recommendation:  As advised  Discharge Instructions    Discharge Instructions    Call MD for:  difficulty breathing, headache or visual disturbances   Complete by: As directed    Call MD for:  persistant dizziness or light-headedness   Complete by: As directed    Call MD for:  persistant nausea and vomiting   Complete by: As directed    Call MD for:  severe uncontrolled pain   Complete by: As directed    Call MD for:  temperature >100.4   Complete by: As directed    Diet - low sodium heart healthy   Complete by: As directed    Discharge instructions   Complete by: As directed    1) you have inflammatory arthritis of the right elbow- follow-[facility] Dr [facility]  [address] [place], [place], [place] [address] Phone [phone] 2) take medications as prescribed 3) family members should initiate guardianship process--- as patient has poor insight, poor cognitive and executive functioning and poor decision-making  due to dementia   Increase activity slowly   Complete by: As directed         Discharge Medications     Allergies as of 02/09/2019   No Known Allergies     Medication List    TAKE these medications   acetaminophen 325 MG tablet Commonly known as: TYLENOL Take 2 tablets (650 mg total) by mouth every 6 (six) hours as needed for mild pain, fever or headache (or Fever >/= 101).   cyanocobalamin 1000 MCG/ML injection Commonly known as: (VITAMIN B-12) Inject 1 mL (1,000 mcg total) into the muscle every 30 (thirty) days. Start taking on: February 25, 2019   donepezil 5 MG tablet Commonly known as: ARICEPT Take 1 tablet (5 mg total) by mouth at bedtime.   ferrous sulfate 325 (65 FE) MG tablet Take 1 tablet (325 mg total) by mouth daily with breakfast.   indomethacin 50 MG capsule Commonly known as: INDOCIN Take 1 capsule (50 mg total) by mouth 2 (two) times  daily with a meal for 10 days. Start taking on: February 10, 2019   LORazepam 0.5 MG tablet Commonly known as: Ativan Take 1 tablet (0.5 mg total) by mouth every 12 (twelve) hours as needed for anxiety or sleep.   ondansetron 4 MG tablet Commonly known as: Zofran Take 1 tablet (4 mg total) by mouth every 8 (eight) hours as needed for nausea or vomiting.   oxyCODONE 5 MG immediate release tablet Commonly known as: Oxy IR/ROXICODONE Take 1 tablet (5 mg total) by mouth every 4 (four) hours as needed for moderate pain.   potassium chloride SA 20 MEQ tablet Commonly known as: K-DUR Take 20 mEq by mouth daily. For 3 days   QUEtiapine 25 MG tablet Commonly known as: SEROQUEL Take 1 tablet (25 mg total) by mouth at bedtime.   senna-docusate 8.6-50 MG tablet Commonly known as: Senokot-S Take 2 tablets by mouth at bedtime.   simethicone 80 MG chewable tablet Commonly known as: MYLICON Chew 1 tablet (80 mg total) by mouth every 6 (six) hours as needed for flatulence.            Durable Medical Equipment  (From  admission, onward)         Start     Ordered   02/09/19 1641  For home use only DME Walker rolling  Once    Question:  Patient needs a walker to treat with the following condition  Answer:  DJD (degenerative joint disease)   02/09/19 1640   02/09/19 1639  For home use only DME Walker  Once    Question:  Patient needs a walker to treat with the following condition  Answer:  DJD (degenerative joint disease)   02/09/19 1638          Major procedures and Radiology Reports - PLEASE review detailed and final reports for all details, in brief -   Dg Chest 1 View  Result Date: 02/05/2019 CLINICAL DATA:  Acute onset of right arm pain and hallucinations. Evaluate for infection. EXAM: CHEST  1 VIEW COMPARISON:  Chest radiograph performed 08/12/2016 FINDINGS: The lungs are mildly hypoexpanded. Mild vascular congestion is noted. There is no evidence of focal opacification, pleural effusion or pneumothorax. The cardiomediastinal silhouette is within normal limits. No acute osseous abnormalities are seen. IMPRESSION: Lungs mildly hypoexpanded but grossly clear. Mild vascular congestion noted. Electronically Signed   By: Garald Balding M.D.   On: 02/05/2019 21:03   Dg Elbow Complete Right  Result Date: 02/05/2019 CLINICAL DATA:  73 year old female with right arm pain. No known injury. Evaluate for possible infection. EXAM: RIGHT ELBOW - COMPLETE 3+ VIEW; RIGHT HUMERUS - 2+ VIEW COMPARISON:  Right shoulder radiograph dated 05/19/2018 FINDINGS: No definite acute fracture identified. Small bone fragments adjacent to the lateral epicondyle as well as at the tip of the coronoid process most likely chronic. There is however elevation of the anterior fat pad indicative of joint effusion. An occult fracture or an infectious process is not excluded. Clinical correlation is recommended. No periosteal elevation or bone erosion to suggest acute osteomyelitis by radiograph. The soft tissues are unremarkable. No  radiopaque foreign object or soft tissue gas. IMPRESSION: 1. No definite acute fracture.  No dislocation. 2. Elevation of the anterior fat pad of the elbow indicative of joint effusion. Clinical correlation is recommended. Electronically Signed   By: Anner Crete M.D.   On: 02/05/2019 21:09   Dg Ankle Complete Right  Result Date: 02/05/2019 CLINICAL DATA:  Right foot and  ankle pain and swelling. EXAM: RIGHT ANKLE - COMPLETE 3+ VIEW COMPARISON:  None. FINDINGS: Single screw traverses the distal fibula. Advanced tibial talar joint space narrowing with osseous remodeling, subchondral cystic change and sclerosis. Mild bony fragmentation of the medial malleolus. No definite tibial talar joint effusion. There is a small plantar calcaneal spur. No bony destructive change. Moderate soft tissue edema about the ankle. No soft tissue air. No radiopaque foreign body. IMPRESSION: 1. Moderate soft tissue edema about the ankle. No soft tissue air or radiopaque foreign body. No radiographic findings of osteomyelitis. 2. Advanced tibial talar osteoarthritis. Electronically Signed   By: Keith Rake M.D.   On: 02/05/2019 21:06   Ct Head Wo Contrast  Result Date: 01/23/2019 CLINICAL DATA:  Metabolic encephalopathy. EXAM: CT HEAD WITHOUT CONTRAST TECHNIQUE: Contiguous axial images were obtained from the base of the skull through the vertex without intravenous contrast. COMPARISON:  None. FINDINGS: Brain: No evidence of acute infarction, hemorrhage, hydrocephalus, extra-axial collection or mass lesion/mass effect. Vascular: No hyperdense vessel or unexpected calcification. Skull: Normal. Negative for fracture or focal lesion. Sinuses/Orbits: No acute finding. Other: None. IMPRESSION: No acute intracranial abnormalities identified. Electronically Signed   By: Dorise Bullion III M.D   On: 01/23/2019 18:30   Ct Abdomen Pelvis W Contrast  Result Date: 01/22/2019 CLINICAL DATA:  Lower abdominal pain for 2 weeks EXAM: CT  ABDOMEN AND PELVIS WITH CONTRAST TECHNIQUE: Multidetector CT imaging of the abdomen and pelvis was performed using the standard protocol following bolus administration of intravenous contrast. CONTRAST:  136m OMNIPAQUE IOHEXOL 300 MG/ML  SOLN COMPARISON:  12/23/2018 FINDINGS: Lower chest: Lung bases are well aerated with some minimal scarring bilaterally. Hepatobiliary: No focal liver abnormality is seen. No gallstones, gallbladder wall thickening, or biliary dilatation. Pancreas: Unremarkable. No pancreatic ductal dilatation or surrounding inflammatory changes. Spleen: Normal in size without focal abnormality. Adrenals/Urinary Tract: Adrenal glands are within normal limits. Kidneys demonstrate a normal enhancement pattern bilaterally. Tiny nonobstructing right lower pole renal stone is noted. Normal excretion of contrast is noted on delayed images. The bladder is well distended. Stomach/Bowel: Diverticular change of the colon is noted without evidence of diverticulitis. No obstructive or inflammatory changes are seen. The appendix is air-filled and within normal limits. No small bowel abnormality is noted. Stomach is unremarkable with the exception of a small sliding-type hiatal hernia. Vascular/Lymphatic: Aortic atherosclerosis. No enlarged abdominal or pelvic lymph nodes. Reproductive: Uterus and bilateral adnexa are unremarkable. Other: No abdominal wall hernia or abnormality. No abdominopelvic ascites. Musculoskeletal: Chronic anterolisthesis of L5 on S1 is seen. Disc space narrowing at L4-5 is noted. IMPRESSION: Tiny nonobstructing right lower pole renal stone. Diverticulosis without diverticulitis. No acute abnormality noted. Electronically Signed   By: MInez CatalinaM.D.   On: 01/22/2019 21:35   Dg Humerus Right  Result Date: 02/05/2019 CLINICAL DATA:  73year old female with right arm pain. No known injury. Evaluate for possible infection. EXAM: RIGHT ELBOW - COMPLETE 3+ VIEW; RIGHT HUMERUS - 2+ VIEW  COMPARISON:  Right shoulder radiograph dated 05/19/2018 FINDINGS: No definite acute fracture identified. Small bone fragments adjacent to the lateral epicondyle as well as at the tip of the coronoid process most likely chronic. There is however elevation of the anterior fat pad indicative of joint effusion. An occult fracture or an infectious process is not excluded. Clinical correlation is recommended. No periosteal elevation or bone erosion to suggest acute osteomyelitis by radiograph. The soft tissues are unremarkable. No radiopaque foreign object or soft tissue gas. IMPRESSION: 1.  No definite acute fracture.  No dislocation. 2. Elevation of the anterior fat pad of the elbow indicative of joint effusion. Clinical correlation is recommended. Electronically Signed   By: Anner Crete M.D.   On: 02/05/2019 21:09   Dg Hand Complete Right  Result Date: 02/05/2019 CLINICAL DATA:  Acute onset of right hand pain, with thumb swelling and erythema. EXAM: RIGHT HAND - COMPLETE 3+ VIEW COMPARISON:  None. FINDINGS: There is no evidence of fracture or dislocation. Mild degenerative change is noted at the distal interphalangeal joints, concerning for mild osteoarthritis. The carpal rows are intact, and demonstrate normal alignment. Mild calcification is noted at the triangular fibrocartilage. Mild soft tissue swelling is noted about the thumb. The thumb is otherwise grossly unremarkable. IMPRESSION: 1. No evidence of fracture or dislocation. 2. Mild soft tissue swelling about the thumb; thumb otherwise unremarkable. 3. Mild degenerative change at the distal interphalangeal joints, concerning for mild osteoarthritis. Electronically Signed   By: Garald Balding M.D.   On: 02/05/2019 21:05   Dg Foot Complete Right  Result Date: 02/05/2019 CLINICAL DATA:  Right foot and ankle pain and swelling. EXAM: RIGHT FOOT COMPLETE - 3+ VIEW COMPARISON:  None. FINDINGS: There is no evidence of fracture or dislocation. Mild hammertoe  deformity of the digits. Plantar calcaneal spur. No bony destructive change. Generalized soft tissue edema. No soft tissue air. No radiopaque foreign body. IMPRESSION: Generalized soft tissue edema. No acute osseous abnormality or findings of osteomyelitis. Electronically Signed   By: Keith Rake M.D.   On: 02/05/2019 21:08    Micro Results    Recent Results (from the past 240 hour(s))  Body fluid culture     Status: None   Collection Time: 02/05/19  9:33 PM   Specimen: Synovium; Body Fluid  Result Value Ref Range Status   Specimen Description   Final    SYNOVIAL Performed at [facility], [address], [place], [place] [address]    Special Requests   Final    R ELBOW Performed at [facility], [address] [place], [place] [address]    Gram Stain   Final    MODERATE WBC PRESENT,BOTH PMN AND MONONUCLEAR NO ORGANISMS SEEN    Culture   Final    NO GROWTH 3 DAYS Performed at [facility], [place] [address] [place], [place] [address]    Report Status 02/09/2019 FINAL  Final  Gram stain     Status: None   Collection Time: 02/05/19  9:56 PM   Specimen: Synovium; Synovial Fluid  Result Value Ref Range Status   Specimen Description SYNOVIAL  Final   Special Requests RIGHT ELBOW  Final   Gram Stain   Final    ABUNDANT POLYMORPHONUCLEAR NO ORGANISMS SEEN Performed at [facility], [address] [place], [place] [address]    Report Status 02/05/2019 FINAL  Final  SARS Coronavirus 2 (CEPHEID - Performed in [facility] hospital lab), Hosp Order     Status: None   Collection Time: 02/05/19 11:09 PM   Specimen: Nasopharyngeal Swab  Result Value Ref Range Status   SARS Coronavirus 2 NEGATIVE NEGATIVE Final    Comment: (NOTE) If result is NEGATIVE SARS-CoV-2 target nucleic acids are NOT DETECTED. The SARS-CoV-2 RNA is generally detectable in upper and lower  respiratory specimens during the acute phase of infection. The lowest  concentration of SARS-CoV-2 viral copies  this assay can detect is 250  copies / mL. A negative result does not preclude SARS-CoV-2 infection  and should not be used as the sole basis for treatment or other  patient management decisions.  A negative result may occur with  improper specimen collection / handling, submission of specimen other  than nasopharyngeal swab, presence of viral mutation(s) within the  areas targeted by this assay, and inadequate number of viral copies  (<250 copies / mL). A negative result must be combined with clinical  observations, patient history, and epidemiological information. If result is POSITIVE SARS-CoV-2 target nucleic acids are DETECTED. The SARS-CoV-2 RNA is generally detectable in upper and lower  respiratory specimens dur ing the acute phase of infection.  Positive  results are indicative of active infection with SARS-CoV-2.  Clinical  correlation with patient history and other diagnostic information is  necessary to determine patient infection status.  Positive results do  not rule out bacterial infection or co-infection with other viruses. If result is PRESUMPTIVE POSTIVE SARS-CoV-2 nucleic acids MAY BE PRESENT.   A presumptive positive result was obtained on the submitted specimen  and confirmed on repeat testing.  While 2019 novel coronavirus  (SARS-CoV-2) nucleic acids may be present in the submitted sample  additional confirmatory testing may be necessary for epidemiological  and / or clinical management purposes  to differentiate between  SARS-CoV-2 and other Sarbecovirus currently known to infect humans.  If clinically indicated additional testing with an alternate test  methodology [phone]) is advised. The SARS-CoV-2 RNA is generally  detectable in upper and lower respiratory sp ecimens during the acute  phase of infection. The expected result is Negative. Fact Sheet for Patients:  StrictlyIdeas.no Fact Sheet for Healthcare  Providers: BankingDealers.co.za This test is not yet approved or cleared by the [place] FDA and has been authorized for detection and/or diagnosis of SARS-CoV-2 by FDA under an Emergency Use Authorization (EUA).  This EUA will remain in effect (meaning this test can be used) for the duration of the COVID-19 declaration under Section 564(b)(1) of the Act, 21 U.S.C. section 360bbb-3(b)(1), unless the authorization is terminated or revoked sooner. Performed at [facility], [address], [place], [place] [address]        Today   Subjective    Kayline Sheer today has no new complaints, eating or drinking well, remains intermittently confused and forgetful but overall cooperative, no agitation          Patient has been seen and examined prior to discharge   Objective   Blood pressure (!) 114/53, pulse 66, temperature 98.3 F (36.8 C), temperature source Oral, resp. rate 15, height 5' 1"  (1.549 m), weight 63.5 kg, SpO2 99 %.   Intake/Output Summary (Last 24 hours) at 02/09/2019 1653 Last data filed at 02/09/2019 1320 Gross per 24 hour  Intake 480 ml  Output --  Net 480 ml    Exam Gen:- Awake Alert,  In no apparent distress  HEENT:- [place].AT, No sclera icterus Neck-Supple Neck,No JVD,.  Lungs-  CTAB , fair symmetrical air movement CV- S1, S2 normal, regular  Abd-  +ve B.Sounds, Abd Soft, No tenderness,    Extremity/Skin:- No  edema, pedal pulses present  Psych-affect is appropriate, some cognitive deficits  neuro-generalized weakness, no new focal deficits, no tremors MSK--- mostly resolved  right elbow swelling, warmth and  tenderness , right elbow range of motion is now within normal limits --- no streaking   Data Review   CBC w Diff:  Lab Results  Component Value Date   WBC 4.9 02/09/2019   HGB 9.6 (L) 02/09/2019  HCT 30.0 (L) 02/09/2019   PLT 325 02/09/2019   LYMPHOPCT 16 02/05/2019   MONOPCT 17 02/05/2019   EOSPCT 0 02/05/2019    BASOPCT 0 02/05/2019    CMP:  Lab Results  Component Value Date   NA 139 02/09/2019   K 3.2 (L) 02/09/2019   CL 106 02/09/2019   CO2 25 02/09/2019   BUN 6 (L) 02/09/2019   CREATININE 0.56 02/09/2019   PROT 6.5 02/06/2019   ALBUMIN 2.8 (L) 02/06/2019   BILITOT 1.1 02/06/2019   ALKPHOS 55 02/06/2019   AST 14 (L) 02/06/2019   ALT 9 02/06/2019  . Total Discharge time is about 33 minutes  Roxan Hockey M.D on 02/09/2019 at 4:53 PM  Go to [email or web address] -  for contact info  [facility] - Office  [phone]

## 2019-02-09 NOTE — Progress Notes (Signed)
Nsg Discharge Note  Admit Date:  02/05/2019 Discharge date: 02/09/2019   Wendy Mccann to be D/C'd Home  per MD order.  AVS completed.  Patient able to verbalize understanding.  Discharge Medication: Allergies as of 02/09/2019   No Known Allergies     Medication List    TAKE these medications   acetaminophen 325 MG tablet Commonly known as: TYLENOL Take 2 tablets (650 mg total) by mouth every 6 (six) hours as needed for mild pain, fever or headache (or Fever >/= 101).   cyanocobalamin 1000 MCG/ML injection Commonly known as: (VITAMIN B-12) Inject 1 mL (1,000 mcg total) into the muscle every 30 (thirty) days. Start taking on: February 25, 2019   donepezil 5 MG tablet Commonly known as: ARICEPT Take 1 tablet (5 mg total) by mouth at bedtime.   ferrous sulfate 325 (65 FE) MG tablet Take 1 tablet (325 mg total) by mouth daily with breakfast.   indomethacin 50 MG capsule Commonly known as: INDOCIN Take 1 capsule (50 mg total) by mouth 2 (two) times daily with a meal for 10 days. Start taking on: February 10, 2019   LORazepam 0.5 MG tablet Commonly known as: Ativan Take 1 tablet (0.5 mg total) by mouth every 12 (twelve) hours as needed for anxiety or sleep.   ondansetron 4 MG tablet Commonly known as: Zofran Take 1 tablet (4 mg total) by mouth every 8 (eight) hours as needed for nausea or vomiting.   oxyCODONE 5 MG immediate release tablet Commonly known as: Oxy IR/ROXICODONE Take 1 tablet (5 mg total) by mouth every 4 (four) hours as needed for moderate pain.   potassium chloride SA 20 MEQ tablet Commonly known as: K-DUR Take 20 mEq by mouth daily. For 3 days   QUEtiapine 25 MG tablet Commonly known as: SEROQUEL Take 1 tablet (25 mg total) by mouth at bedtime.   senna-docusate 8.6-50 MG tablet Commonly known as: Senokot-S Take 2 tablets by mouth at bedtime.   simethicone 80 MG chewable tablet Commonly known as: MYLICON Chew 1 tablet (80 mg total) by mouth every 6  (six) hours as needed for flatulence.            Durable Medical Equipment  (From admission, onward)         Start     Ordered   02/09/19 1641  For home use only DME Walker rolling  Once    Question:  Patient needs a walker to treat with the following condition  Answer:  DJD (degenerative joint disease)   02/09/19 1640   02/09/19 1639  For home use only DME Walker  Once    Question:  Patient needs a walker to treat with the following condition  Answer:  DJD (degenerative joint disease)   02/09/19 1638          Discharge Assessment: Vitals:   02/08/19 2141 02/09/19 0702  BP: 138/84 (!) 114/53  Pulse: 64 66  Resp: 18 15  Temp: 97.6 F (36.4 C) 98.3 F (36.8 C)  SpO2: 100% 99%   Skin clean, dry and intact without evidence of skin break down, no evidence of skin tears noted. IV catheter discontinued intact. Site without signs and symptoms of complications - no redness or edema noted at insertion site, patient denies c/o pain - only slight tenderness at site.  Dressing with slight pressure applied.  D/c Instructions-Education: Discharge instructions given to patient/family with verbalized understanding. D/c education completed with patient's sister  including follow up instructions,  medication list, d/c activities limitations if indicated, with other d/c instructions as indicated by MD - patient's sister able to verbalize understanding, all questions fully answered. Patient's sister instructed to return to ED, call 911, or call MD for any changes in condition.  Patient escorted via WC, and D/C home via private auto.  Jethro Poling, RN 02/09/2019 5:16 PM

## 2019-02-09 NOTE — TOC Transition Note (Signed)
Transition of Care [facility]) - CM/SW Discharge Note   Patient Details  Name: Wendy Mccann MRN: [id number] Date of Birth: [birth year]  [facility]) CM/SW Contact:  Trish Mage, LCSW Phone Number: 02/09/2019, 4:40 PM   Clinical Narrative:   Met with patient who is d/cing today.  She is pleasantly confused.  Was arranging her belongings by the sink when I arrived.  When asked stated she is preparing to get up at [facility] for her work shift in [employer].  Later told me she is currently in [facility] "because I stay here to help out with the patients when they call out and need something."  Knew the year, but no month nor date.  With her permission called sister.  Encouraged sister to pursue guardianship as patient does not have resources to pay for in home aid.  Currently, sister and brother are only local supports, and they both work.  Sister stops by from 3:30-5 when she gets off of work, and brother from 63-7PM when he gets off.   Sister's name is [phone] [name],   Attempted to make referral to [facility], but unable to make formal referral as patient currently does not have PCP.  CSW to call to try to get PCP appointment, and then refer formally to [facility] for [facility] nursing, PT, social work, aid.    Final next level of care: [place] Barriers to Discharge: No Barriers Identified   Patient Goals and CMS Choice Patient states their goals for this hospitalization and ongoing recovery are:: "I think something is happening with the screws in my ankle." CMS Medicare.gov Compare Post Acute Care list provided to:: Patient Choice offered to / list presented to : Patient  Discharge Placement                       Discharge Plan and Services                DME Arranged: Walker rolling DME Agency: [employer] Date DME Agency Contacted: 02/09/19 Time DME Agency Contacted: 1640 Representative spoke with at DME Agency: [place] Agency:  [place] ([place]) Date [place]: 02/09/19 Time [place]: 1640 Representative spoke with at [place]: Leda Quail  Social Determinants of Health ([place]) Interventions     Readmission Risk Interventions No flowsheet data found.

## 2019-02-09 NOTE — Discharge Instructions (Signed)
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

## 2019-02-10 NOTE — Clinical Social Work Note (Signed)
Confirmed with sister that she knew about August 11 appointment with PCP.  Confirmed with [facility] that they would be able to pick up patient once she is seen.

## 2019-02-11 ENCOUNTER — Other Ambulatory Visit: Payer: Self-pay | Admitting: *Deleted

## 2019-02-11 DIAGNOSIS — F0390 Unspecified dementia without behavioral disturbance: Secondary | ICD-10-CM

## 2019-02-11 LAB — GLUCOSE, BODY FLUID OTHER: Glucose, Body Fluid Other: UNDETERMINED mg/dL

## 2019-02-11 NOTE — Addendum Note (Signed)
Addended by: Barrington Ellison on: 02/11/2019 05:19 PM   Modules accepted: Orders

## 2019-02-11 NOTE — Patient Outreach (Signed)
[place] [facility]) Care Management  02/11/2019  Wendy Mccann [birth year] [id number]  Transition of care telephone call  Referral received: 01/27/19 Initial outreach: 02/11/19 Insurance: [place]  Initial unsuccessful telephone call to patient's home number in order to complete transition of care assessment; no answer, left HIPAA compliant voicemail message requesting return call.   Objective: Per the electronic medical record, Wendy Mccann was admitted to [facility] on 7/9 for abdominal pain, hypokalemia, generalized weakness and high fall risk, anemia, volume depletion, hyperbilirubinemia and intermittent periods of confusion, visual hallucinations . Patient was found to have swollen joints including right wrist, elbow and right ankle.Arthrocentesis wasperformed on right elbow joint on 02/05/19.  She was discharged to [facility] on 01/27/19. She was discharged from [facility] to home on 02/09/19. This RNCM received a secure from Roque Lias , [place] Social Worker stating he had been in contact with patient's sister and encouraged her to pursue guardianship and Medicaid as a payor source. Rodney's email also stated that home health services of physical therapy and nursing was ordered through [place]. Rodney sent this RNCM another e-mail on 7/28 stating that [facility] cannot provide home health services until patient is seen by her provider on 02/24/19.   Comorbidities include: dementia without behavioral disturbances, normocytic anemia, generalized weakness, arthritis and effusion of the right elbow  Plan: Consulted with [place] [facility] ([place]  assistant manager Bary Castilla. Per leadership direction, will refer patient to [place] social worker to  assist patient and family with appropriate disposition.    Barrington Ellison RN,CCM,CDE [place] [facility]  Coordinator Office Phone [phone] Office Fax [phone]

## 2019-02-11 NOTE — Addendum Note (Signed)
Addended by: Barrington Ellison on: 02/11/2019 05:31 PM   Modules accepted: Orders

## 2019-02-12 DIAGNOSIS — M25421 Effusion, right elbow: Secondary | ICD-10-CM | POA: Diagnosis not present

## 2019-02-12 DIAGNOSIS — E876 Hypokalemia: Secondary | ICD-10-CM | POA: Diagnosis not present

## 2019-02-19 ENCOUNTER — Telehealth: Payer: Self-pay | Admitting: *Deleted

## 2019-02-19 NOTE — Patient Outreach (Signed)
[place] [facility]) Care Management  02/19/2019  Wendy Mccann May 20, 1946 [id number]   CSW received referral from [place], Marcie Bal that patient was discharged to [facility] and then discharged to home on 7/27 with [facility] to provide HHPT and nursing. CSW called & spoke with patient & her brother regarding resources. CSW provided patient with the phone number to [employer] (ph#: [phone]) to inquire about eldercare. Patient also inquired about her benefits through [facility], CSW provided patient with the Benefits Call Center phone #: [phone]. Patient declined need for transportation or meals on wheels as her siblings assist her with transportation to doctors appointments and errands. Patient also states that someone had come out a few weeks ago and started receiving meals this week. Patient anticipates [facility] to start tomorrow, but declined any further CSW needs at this time. CSW will sign off but encouraged patient to call back should any further needs come up.    Raynaldo Opitz, LCSW [facility]  Clinical Social Worker cell #: [phone]

## 2019-02-24 ENCOUNTER — Other Ambulatory Visit: Payer: Self-pay

## 2019-02-24 ENCOUNTER — Encounter: Payer: Self-pay | Admitting: Family Medicine

## 2019-02-24 ENCOUNTER — Ambulatory Visit (INDEPENDENT_AMBULATORY_CARE_PROVIDER_SITE_OTHER): Payer: 59 | Admitting: Family Medicine

## 2019-02-24 VITALS — BP 127/61 | HR 65 | Temp 98.2°F | Ht 61.0 in | Wt 137.4 lb

## 2019-02-24 DIAGNOSIS — M1990 Unspecified osteoarthritis, unspecified site: Secondary | ICD-10-CM | POA: Diagnosis not present

## 2019-02-24 DIAGNOSIS — E538 Deficiency of other specified B group vitamins: Secondary | ICD-10-CM | POA: Insufficient documentation

## 2019-02-24 DIAGNOSIS — R768 Other specified abnormal immunological findings in serum: Secondary | ICD-10-CM | POA: Diagnosis not present

## 2019-02-24 DIAGNOSIS — F0390 Unspecified dementia without behavioral disturbance: Secondary | ICD-10-CM

## 2019-02-24 DIAGNOSIS — E876 Hypokalemia: Secondary | ICD-10-CM | POA: Diagnosis not present

## 2019-02-24 DIAGNOSIS — D649 Anemia, unspecified: Secondary | ICD-10-CM | POA: Diagnosis not present

## 2019-02-24 MED ORDER — CYANOCOBALAMIN 1000 MCG/ML IJ SOLN
1000.0000 ug | INTRAMUSCULAR | Status: DC
  Administered 2019-02-24: 1000 ug via INTRAMUSCULAR

## 2019-02-24 NOTE — Progress Notes (Addendum)
New Patient Office Visit  Subjective:  Patient ID: Wendy Mccann, female    DOB: 1946/06/23  Age: 73 y.o. MRN: [id number]  CC:  Chief Complaint  Patient presents with  . New Patient (Initial Visit)  . Dementia    beginning stages  Hospital follow up  HPI Wendy Mccann presents for follow up evaluation after being hospitalized from 01-22-19-01-27-19 for acute metabolic encephalopathy.  Pt discharged to [facility] on 01-27-19-discharged to home on 01-28-19 with home health. Admission 02-05-19 for hallucination over the previous week, dementia, anemia and hypokalemia-discharged 02/09/19   01-22-19: ct of abdomen and pelvis:Tiny nonobstructing right lower pole renal stone. Diverticulosis without diverticulitis. No acute abnormality noted.   01-23-19: ct of head: No acute intracranial abnormalities identified.   01-23-19: wbc 10.2; hgb 10.5; hct 32.7; mcv 99.1; plt 241; glucose 112; bun 9; create 0.66; k+ 3.3; an++ 139; ca 8.8; total bili 1.4 albumin 3.0; vit B 12: 189 folate 6.2; iron 10; tibc 267; ferritin 214; tsh 2.344   for dementia, anemia, B12 deficiency, joint swelling pain. Pt long term hospital employee-admitted for hallucinations, anemia, hypokalemia and dementia.  Pt with +ANA and RF.  Recommendations for primary care physician for things to follow: at discharge 01/27/19  1) fall precautions 2) vitamin B 12 injections monthly  01-27-19 Dementia--- as per patient's sister patient has been having cognitive decline over the last 4 years to the point where at work she was having difficulty making salads so she was told to quit cooking food and focus on cleaning the kitchen... 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 Alzheimer's dementia (HCC)   . Anemia   . Medical history non-contributory   . Rotator cuff (capsule) sprain     Past Surgical History:  Procedure Laterality Date  . ankles      Family History  Problem Relation Age of Onset  . COPD Father     Social History   Socioeconomic History  . Marital status: Single    Spouse name: Not on file  . Number of children: Not on file  . Years of education: Not on file  . Highest education level: Not on file  Occupational History  . Not on file  Social Needs  . Financial resource strain: Not on file  . Food insecurity    Worry: Not on file    Inability: Not on file  . Transportation needs    Medical: Not on file    Non-medical:  Not on file  Tobacco Use  . Smoking status: Never Smoker  . Smokeless tobacco: Never Used  Substance and Sexual Activity  . Alcohol use: No  . Drug use: No  . Sexual activity: Not on file  Lifestyle  . Physical activity    Days per week: Not on file    Minutes per session: Not on file  . Stress: Not on file  Relationships  . Social Herbalist on phone:  Not on file    Gets together: Not on file    Attends religious service: Not on file    Active member of club or organization: Not on file    Attends meetings of clubs or organizations: Not on file    Relationship status: Not on file  . Intimate partner violence    Fear of current or ex partner: Not on file    Emotionally abused: Not on file    Physically abused: Not on file    Forced sexual activity: Not on file  Other Topics Concern  . Not on file  Social History Narrative  . Not on file    ROS Review of Systems  Constitutional: Positive for activity change, fatigue and unexpected weight change. Negative for fever.  HENT: Negative for congestion, sinus pain, sore throat and trouble swallowing.   Eyes: Negative for visual disturbance.  Respiratory: Negative for cough and shortness of breath.   Cardiovascular: Positive for leg swelling. Negative for chest pain.  Genitourinary: Negative for dysuria.  Musculoskeletal: Negative for back pain and myalgias.  Neurological: Negative for dizziness and headaches.  Psychiatric/Behavioral: Positive for confusion and decreased concentration.    Objective:   Today's Vitals: BP 127/61 (BP Location: Left Arm, Patient Position: Sitting, Cuff Size: Normal)   Pulse 65   Temp 98.2 F (36.8 C) (Oral)   Ht 5\' 1"  (1.549 m)   Wt 137 lb 6.4 oz (62.3 kg)   SpO2 97%   BMI 25.96 kg/m   Physical Exam Constitutional:      General: She is not in acute distress.    Appearance: Normal appearance. She is normal weight. She is not ill-appearing.  HENT:     Head: Normocephalic and atraumatic.     Right Ear: Tympanic membrane normal.     Left Ear: Tympanic membrane normal.     Nose: Nose normal.  Eyes:     Conjunctiva/sclera: Conjunctivae normal.  Cardiovascular:     Rate and Rhythm: Normal rate and regular rhythm.     Pulses: Normal pulses.     Heart sounds: Normal heart sounds.  Pulmonary:     Effort: Pulmonary effort is normal.     Breath  sounds: Normal breath sounds.  Musculoskeletal:        General: Swelling and tenderness present.     Right lower leg: Edema present.     Left lower leg: Edema present.  Neurological:     Mental Status: She is alert. She is disoriented.     Gait: Gait normal.  Psychiatric:        Mood and Affect: Mood normal.   MMS 8/30  Assessment & Plan:   Problem List Items Addressed This Visit    None      Outpatient Encounter Medications as of 02/24/2019  Medication Sig  . acetaminophen (TYLENOL) 325 MG tablet Take 2 tablets (650 mg total) by mouth every 6 (six) hours as needed for mild pain, fever or headache (or Fever >/= 101).  . [  START ON 02/25/2019] cyanocobalamin (,VITAMIN B-12,) 1000 MCG/ML injection Inject 1 mL (1,000 mcg total) into the muscle every 30 (thirty) days.  [name] donepezil (ARICEPT) 5 MG tablet Take 1 tablet (5 mg total) by mouth at bedtime.  . ferrous sulfate 325 (65 FE) MG tablet Take 1 tablet (325 mg total) by mouth daily with breakfast.  . LORazepam (ATIVAN) 0.5 MG tablet Take 1 tablet (0.5 mg total) by mouth every 12 (twelve) hours as needed for anxiety or sleep.  [name] ondansetron (ZOFRAN) 4 MG tablet Take 1 tablet (4 mg total) by mouth every 8 (eight) hours as needed for nausea or vomiting.  [name] oxyCODONE (OXY IR/ROXICODONE) 5 MG immediate release tablet Take 1 tablet (5 mg total) by mouth every 4 (four) hours as needed for moderate pain.  . potassium chloride SA (K-DUR) 20 MEQ tablet Take 20 mEq by mouth daily. For 3 days  . QUEtiapine (SEROQUEL) 25 MG tablet Take 1 tablet (25 mg total) by mouth at bedtime.  . senna-docusate (SENOKOT-S) 8.6-50 MG tablet Take 2 tablets by mouth at bedtime.  . simethicone (MYLICON) 80 MG chewable tablet Chew 1 tablet (80 mg total) by mouth every 6 (six) hours as needed for flatulence.   No facility-administered encounter medications on file as of 02/24/2019.     Follow-up:  1. Rt Elbow Arthritis - Ambulatory referral to Rheumatology-pt has not  received an appointment for rheumatology Tylenol otc 2. Elevated rheumatoid factor - Ambulatory referral to Rheumatology Pt has NOT been given an appt for follow up  [facility] Casimer Lanius, [facility] at [address], [place], [place] [address] Phone [phone]-recommended during admission  3. Normocytic anemia Iron, TIBC, Ferritin  4. Dementia without behavioral disturbance, unspecified dementia type (HCC) pt with one episode of disorientation and hallucination-hospitalized x 2. D/w brother concern for dementia-Health Care of attorney and Living will discussed. Pt will see neurology for evaluation and determination of compentancy.  currently pt lives alone-d/w pt and brother need for emergency alert system.  HHC recommended for evaluation-social worker to assist with placement. Concern for safety of environment with current mental status -meds Aricept Neurology refer MMS -8 5. Hypokalemia CMP Pt given potassium to take for 3 days at discharge. No follow up labwork in 2 weeks 6. B12 deficiency IM B12 today(due tomorrow per note) Jomar Denz Mat Carne, MD

## 2019-02-25 ENCOUNTER — Encounter: Payer: Self-pay | Admitting: Family Medicine

## 2019-02-25 DIAGNOSIS — D649 Anemia, unspecified: Secondary | ICD-10-CM | POA: Diagnosis not present

## 2019-02-25 NOTE — Patient Instructions (Addendum)
Neurology referral for assistance with medication and formal assessment of dementia  Rheumatology referral for diagnosis and treatment options  Home Health to assist with PT/social work for placement/mental health assessment/nursing to assess medication management.   No narcotics and No benzos-fall risk to patient  Labwork for post hospitalization follow up

## 2019-02-25 NOTE — Addendum Note (Signed)
Addended by: Maryruth Hancock on: 02/25/2019 11:48 PM   Modules accepted: Orders

## 2019-02-26 ENCOUNTER — Telehealth: Payer: Self-pay | Admitting: Family Medicine

## 2019-02-26 LAB — IRON,TIBC AND FERRITIN PANEL
%SAT: 8 % (calc) — ABNORMAL LOW (ref 16–45)
Ferritin: 188 ng/mL (ref 16–288)
Iron: 23 ug/dL — ABNORMAL LOW (ref 45–160)
TIBC: 283 mcg/dL (calc) (ref 250–450)

## 2019-02-26 NOTE — Telephone Encounter (Signed)
Wendy Mccann is returning [name] phone call.

## 2019-02-27 ENCOUNTER — Telehealth: Payer: Self-pay

## 2019-02-27 ENCOUNTER — Other Ambulatory Visit: Payer: Self-pay | Admitting: *Deleted

## 2019-02-27 NOTE — Patient Outreach (Addendum)
[place] [facility]) Care Management  02/27/2019  Wendy Mccann January 10, 1946 [id number]  Transition of Care/ Case Closure  Referral received: 01/27/19 Initial outreach: 02/11/19 Insurance: [place] Choice Plan   Subjective: Successful telephone call to patient's home number in order to complete transition of care assessment; 2 HIPAA identifiers verified. Explained purpose of call and completed transition of care assessment.  This RNCM is familiar with Mrs. Penman as she always attended the [place] [facility] blood pressure and blood sugar screenings that were held quarterly in the [employer]. Mrs. Witkop states she is doing well except to "aching bones" due to arthritis and the weather. Says she broke bother ankles in the past and they always ache when the weather changes. Says she is tolerating her regular diet and denies bowel or bladder problems.  Siblings are assisting with her recovery. She says the home health agency is to see her next week.   Objective: Per the electronic medical record, Zeda Gangwer was admitted to [facility] on 7/9 for abdominal pain, hypokalemia, generalized weakness and high fall risk, anemia, volume depletion,hyperbilirubinemia and intermittent periods of confusion, visual hallucinations . Patient was found to have swollen joints including right wrist, elbow and right ankle.Arthrocentesis wasperformed on right elbow joint on 02/05/19.  She was discharged to [facility] on 01/27/19. She was discharged from [facility] to home on 02/09/19. This RNCM received a secure from Roque Lias , [place] Social Worker stating he had been in contact with patient's sister and encouraged her to pursue guardianship and Medicaid as a payor source. Rodney's e-mail also stated that home health services of physical therapy and nursing was ordered through [place]. Rodney sent this RNCM  another e-mail on 7/28 stating that [facility] cannot provide home health services until patient is seen by her provider on 02/24/19. Mrs. 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 She stated I haven't received one call from my manager but I'm going to try and not get emotional about it".   Comorbidities include: dementia without behavioral disturbances, normocytic anemia, generalized weakness, arthritis and effusion of the right elbow  Assessment: Mrs Toops has limited understanding of her care instructions but she states her brother helps her as needed.  Plan: At Mrs. Nand request provided her with the name and phone number of her primary car providers office.  [place] Management LCSW Claiborne Billings Harrison's note of 02/19/19 and primary care provider office visit note of 02/24/19 with referral to home health.  Will send In Basket message to Dr. Holly Bodily offering [place] Management assistance with care management needs. No ongoing care management needs identified so will close case to [place] Management services and route successful outreach letter with [place] Management pamphlet and 24 Hour Nurse Line Magnet and medication box to [place] Management clinical pool to be mailed to patient's home address.   Barrington Ellison RN,CCM,CDE [place] Management Coordinator Office Phone [phone] Office Fax [phone]

## 2019-02-27 NOTE — Telephone Encounter (Signed)
Please call [place] [facility] regarding medication for [employer].  [phone] or [phone]

## 2019-03-24 NOTE — Progress Notes (Signed)
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   History of Present Illness: Wendy Mccann is a 73 y.o. female history of joint pain.  She is accompanied by her son today.  According to patient she had a right ankle joint fracture about 20 years ago.  She has had off-and-on swelling in the right ankle since then.  She states in the first week of July she developed pain and swelling in her right wrist joint.  At the time she was admitted to the hospital due to memory loss.  They diagnosed her with dementia.  They also did x-ray of her wrist joint.  No treatment was given.  She states the symptoms have resolved and she has no recurrence of any other joint pain now.  She denies any discomfort in her spine, shoulders, elbows, wrist, hips, knees and her feet.  Activities of Daily Living:  Patient reports morning stiffness for 10 minutes.   Patient Denies nocturnal pain.  Difficulty dressing/grooming: Denies Difficulty climbing stairs: Denies Difficulty getting out of chair: Denies Difficulty using hands for taps, buttons, cutlery, and/or writing: Denies  Review of Systems  Constitutional: Positive for appetite change and weight loss. Negative for fatigue.  HENT: Negative for mouth sores, mouth dryness and nose dryness.   Eyes: Negative for itching and dryness.  Respiratory: Negative for shortness of breath, wheezing and difficulty breathing.   Cardiovascular: Negative for chest pain and palpitations.  Gastrointestinal: Positive for constipation. Negative for blood in stool and diarrhea.  Endocrine: Negative for increased urination.  Genitourinary: Negative for difficulty urinating and painful  urination.  Musculoskeletal: Positive for arthralgias, joint pain, joint swelling and morning stiffness.  Skin: Negative for rash.  Allergic/Immunologic: Negative for susceptible to infections.  Neurological: Positive for numbness and memory loss. Negative for dizziness, headaches and weakness.  Hematological: Negative for bruising/bleeding tendency.  Psychiatric/Behavioral: Positive for confusion. Negative for sleep disturbance.    PMFS History:  Patient Active Problem List   Diagnosis Date Noted  . Elevated rheumatoid factor 02/24/2019  . B12 deficiency 02/24/2019  . Dementia without behavioral disturbance ([place]) 02/07/2019  . 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 Acute metabolic encephalopathy 86/76/7209  . Normocytic anemia 01/23/2019  . Hypokalemia 01/22/2019  . Volume depletion 01/22/2019  . Hyperbilirubinemia 01/22/2019  . Generalized weakness 08/12/2016  . Orthostatic hypotension 08/12/2016  . Abdominal pain 08/12/2016  . Cough 08/12/2016  . Dehydration, mild 08/12/2016    Past Medical History:  Diagnosis Date  . Alzheimer's dementia ([place])   . Anemia   . Medical history non-contributory   . Rotator cuff (capsule) sprain     Family History  Problem Relation Age of Onset  . Gout Father   . Stroke Father   . Heart attack Father   . Dementia Mother    Past Surgical History:  Procedure Laterality Date  . ankles Bilateral    Social History   Social History Narrative  . Not on file   Immunization History  Administered Date(s) Administered  . Influenza-Unspecified 04/03/2018  . Pneumococcal Polysaccharide-23  07/28/2014  . Tdap 07/16/1992     Objective: Vital Signs: BP 110/61 (BP Location: Right Arm, Patient Position: Sitting, Cuff Size: Normal)   Pulse 66   Resp 13   Ht 4' 10.5" (1.486 m)   Wt 131 lb (59.4 kg)   BMI 26.91 kg/m    Physical Exam Vitals signs and nursing note reviewed.  Constitutional:      Appearance: She is well-developed.  HENT:      Head: Normocephalic and atraumatic.  Eyes:     Conjunctiva/sclera: Conjunctivae normal.  Neck:     Musculoskeletal: Normal range of motion.  Cardiovascular:     Rate and Rhythm: Normal rate and regular rhythm.     Heart sounds: Normal heart sounds.  Pulmonary:     Effort: Pulmonary effort is normal.     Breath sounds: Normal breath sounds.  Abdominal:     General: Bowel sounds are normal.     Palpations: Abdomen is soft.  Lymphadenopathy:     Cervical: No cervical adenopathy.  Skin:    General: Skin is warm and dry.     Capillary Refill: Capillary refill takes less than 2 seconds.  Neurological:     Mental Status: She is alert and oriented to person, place, and time.  Psychiatric:        Behavior: Behavior normal.      Musculoskeletal Exam: C-spine was in good range of motion.  Shoulder joints, elbow joints, wrist joints, MCPs PIPs and DIPs with good range of motion with no synovitis.  Hip joints and knee joints with good range of motion.  She has thickening of her right ankle joint due to previous fracture but no synovitis was noted.  MTPs and PIPs did not show any synovitis.  CDAI Exam: CDAI Score: - Patient Global: -; Provider Global: - Swollen: -; Tender: - Joint Exam   No joint exam has been documented for this visit   There is currently no information documented on the homunculus. Go to the Rheumatology activity and complete the homunculus joint exam.  Investigation: Findings:  02/06/19: ANA 1:320 Nucleolar, 1,1280 Nuclear dot, sed rate 90, RF 18.8, uric acid 4.2, Magnesium 2.4  Component     Latest Ref Rng & Units 02/06/2019  Nucleolar Pattern      1:320 (H)  NOTE:      Comment  Nuclear Dot Pattern      1:1280 (H)  Sed Rate     0 - 22 mm/hr 90 (H)  ANA Ab, IFA      Positive (A)  RA Latex Turbid.     0.0 - 13.9 IU/mL 18.8 (H)  Uric Acid, Serum     2.5 - 7.1 mg/dL 4.2  Magnesium     1.7 - 2.4 mg/dL 2.4   Imaging: No results found.  Recent Labs: Lab  Results  Component Value Date   WBC 4.9 02/09/2019   HGB 9.6 (L) 02/09/2019   PLT 325 02/09/2019   NA 139 02/09/2019   K 3.2 (L) 02/09/2019   CL 106 02/09/2019   CO2 25 02/09/2019   GLUCOSE 81 02/09/2019   BUN 6 (L) 02/09/2019   CREATININE 0.56 02/09/2019   BILITOT 1.1 02/06/2019   ALKPHOS 55 02/06/2019   AST 14 (L) 02/06/2019   ALT 9 02/06/2019   PROT 6.5 02/06/2019   ALBUMIN 2.8 (L) 02/06/2019   CALCIUM 8.8 (L) 02/09/2019   GFRAA >60 02/09/2019    Speciality Comments: No specialty comments available.  Procedures:  No procedures performed Allergies: Patient has no known allergies.   Assessment / Plan:     Visit Diagnoses: Polyarthralgia-patient has an episode in July 2020 with pain and swelling in her right wrist, elbow and discomfort in her shoulder per her son.  I did notice x-rays reports which are reviewed today.  They were basically unremarkable except for mild degenerative changes.  She has had no recurrence of swelling since then.  At this point she denies of any joint pain except for recurrent discomfort in her right ankle joint with the weather change.  She fractured her right ankle joint several years ago.  Positive ANA (antinuclear antibody) -she has no clinical features of autoimmune disease on examination.  Although she had significantly high ANA test.  I will obtain following labs today.  Plan: Sedimentation rate, ANA, Anti-scleroderma antibody, RNP Antibody, Anti-Smith antibody, Sjogrens syndrome-B extractable nuclear antibody, Sjogrens syndrome-A extractable nuclear antibody, Anti-DNA antibody, double-stranded, C3 and C4, Glucose 6 phosphate dehydrogenase  Rheumatoid factor positive -patient has no synovitis today.  02/06/19: ANA 1:320 Nucleolar, 1,1280 Nuclear dot, sed rate 90, RF 18.8, uric acid 4.2, Magnesium 2.4 - Plan: Cyclic citrul peptide antibody, IgG  Acute metabolic encephalopathy-hospitalized in July which was treated.  Dementia without behavioral  disturbance, unspecified dementia type (H[facility])  Orthostatic hypotension  Normocytic anemia-she has anemia.  She is getting further work-up from her PCPs office.  Hyperbilirubinemia  Generalized weakness  B12 deficiency  Orders: Orders Placed This Encounter  Procedures  . Sedimentation rate  . Cyclic citrul peptide antibody, IgG  . ANA  . Anti-scleroderma antibody  . RNP Antibody  . Anti-Smith antibody  . Sjogrens syndrome-B extractable nuclear antibody  . Sjogrens syndrome-A extractable nuclear antibody  . Anti-DNA antibody, double-stranded  . C3 and C4  . Glucose 6 phosphate dehydrogenase   No orders of the defined types were placed in this encounter.   Face-to-face time spent with patient was 45 minutes. Greater than 50% of time was spent in counseling and coordination of care.  Follow-Up Instructions: Return for Polyarthralgia, positive ANA.   Pollyann Savoy, MD  Note - This record has been created using Animal nutritionist.  Chart creation errors have been sought, but may not always  have been located. Such creation errors do not reflect on  the standard of medical care.

## 2019-03-30 ENCOUNTER — Ambulatory Visit: Payer: 59 | Admitting: Family Medicine

## 2019-04-01 ENCOUNTER — Other Ambulatory Visit: Payer: Self-pay

## 2019-04-01 ENCOUNTER — Telehealth: Payer: Self-pay | Admitting: Family Medicine

## 2019-04-01 ENCOUNTER — Ambulatory Visit: Payer: 59 | Admitting: Family Medicine

## 2019-04-01 ENCOUNTER — Telehealth: Payer: Self-pay

## 2019-04-01 VITALS — BP 129/74 | HR 56 | Temp 97.8°F | Ht 61.0 in | Wt 131.6 lb

## 2019-04-01 DIAGNOSIS — M2540 Effusion, unspecified joint: Secondary | ICD-10-CM | POA: Diagnosis not present

## 2019-04-01 DIAGNOSIS — F0390 Unspecified dementia without behavioral disturbance: Secondary | ICD-10-CM

## 2019-04-01 MED ORDER — QUETIAPINE FUMARATE 25 MG PO TABS: 25.0000 mg | ORAL_TABLET | Freq: Every day | ORAL | 0 refills | Status: DC

## 2019-04-01 NOTE — Telephone Encounter (Signed)
Patient was seen in the office today and was not sure if they let Dr. Holly Bodily know patient needs refill on QUEtiapine (SEROQUEL) 25 MG tablet.   [employer] on file.

## 2019-04-01 NOTE — Telephone Encounter (Signed)
Done

## 2019-04-01 NOTE — Patient Instructions (Addendum)
Try miralax daily for stool   Blood work   Consider wearing support knee highs-mild compression

## 2019-04-01 NOTE — Telephone Encounter (Signed)
LeighAnn Minola Guin, CMA  

## 2019-04-01 NOTE — Progress Notes (Signed)
Established Patient Office Visit  Subjective:  Patient ID: Wendy Mccann, female    DOB: 11-Jul-1946  Age: 73 y.o. MRN: [id number]  CC:  Chief Complaint  Patient presents with  . Follow-up  . Joint Swelling    HPI Wendy Mccann presents for   Low K Anemia-taking iron daily-no blood in stool Dementia-referral to neurology Joint swelling-bilat ankles-right worse than left, right wrist swelling-intermittently Pt with h/o surgery bilat ankles  Past Medical History:  Diagnosis Date  . Alzheimer's dementia ([place])   . Anemia   . Medical history non-contributory   . Rotator cuff (capsule) sprain     Past Surgical History:  Procedure Laterality Date  . ankles      Family History  Problem Relation Age of Onset  . COPD Father     Social History   Socioeconomic History  . Marital status: Single    Spouse name: Not on file  . Number of children: Not on file  . Years of education: Not on file  . Highest education level: Not on file  Occupational History  . Not on file  Social Needs  . Financial resource strain: Not on file  . Food insecurity    Worry: Not on file    Inability: Not on file  . Transportation needs    Medical: Not on file    Non-medical: Not on file  Tobacco Use  . Smoking status: Never Smoker  . Smokeless tobacco: Never Used  Substance and Sexual Activity  . Alcohol use: No  . Drug use: No  . Sexual activity: Not on file  Lifestyle  . Physical activity    Days per week: Not on file    Minutes per session: Not on file  . Stress: Not on file  Relationships  . Social Herbalist on phone: Not on file    Gets together: Not on file    Attends religious service: Not on file    Active member of club or organization: Not on file    Attends meetings of clubs or organizations: Not on file    Relationship status: Not on file  . Intimate partner violence    Fear of current or ex partner: Not on file    Emotionally abused: Not on file     Physically abused: Not on file    Forced sexual activity: Not on file  Other Topics Concern  . Not on file  Social History Narrative  . Not on file    Outpatient Medications Prior to Visit  Medication Sig Dispense Refill  . acetaminophen (TYLENOL) 325 MG tablet Take 2 tablets (650 mg total) by mouth every 6 (six) hours as needed for mild pain, fever or headache (or Fever >/= 101). 12 tablet 0  . donepezil (ARICEPT) 5 MG tablet Take 1 tablet (5 mg total) by mouth at bedtime. 30 tablet 0  . ferrous sulfate 325 (65 FE) MG tablet Take 1 tablet (325 mg total) by mouth daily with breakfast. 30 tablet 0  . ondansetron (ZOFRAN) 4 MG tablet Take 1 tablet (4 mg total) by mouth every 8 (eight) hours as needed for nausea or vomiting. 5 tablet 0  . potassium chloride SA (K-DUR) 20 MEQ tablet Take 20 mEq by mouth daily. For 3 days    . QUEtiapine (SEROQUEL) 25 MG tablet Take 1 tablet (25 mg total) by mouth at bedtime. 30 tablet 0  . senna-docusate (SENOKOT-S) 8.6-50 MG tablet Take 2 tablets  by mouth at bedtime. 60 tablet 1  . simethicone (MYLICON) 80 MG chewable tablet Chew 1 tablet (80 mg total) by mouth every 6 (six) hours as needed for flatulence. 30 tablet 0   Facility-Administered Medications Prior to Visit  Medication Dose Route Frequency Provider Last Rate Last Dose  . cyanocobalamin ((VITAMIN B-12)) injection 1,000 mcg  1,000 mcg Intramuscular Q30 days Wandra Feinstein, MD   1,000 mcg at 02/24/19 0530    No Known Allergies  ROS Review of Systems  Musculoskeletal: Positive for arthralgias and joint swelling.  Psychiatric/Behavioral: Positive for confusion.      Objective:    Physical Exam  Constitutional: She appears well-developed and well-nourished.  Cardiovascular: Normal rate and regular rhythm.  Pulmonary/Chest: Effort normal and breath sounds normal.  Musculoskeletal:        General: Edema present.     Comments: Ankle 1+edema bilat  Neurological: She is alert.  disoriented     BP 129/74 (BP Location: Left Arm, Patient Position: Sitting, Cuff Size: Normal)   Pulse (!) 56   Temp 97.8 F (36.6 C) (Oral)   Ht 5\' 1"  (1.549 m)   Wt 131 lb 9.6 oz (59.7 kg)   SpO2 98%   BMI 24.87 kg/m  Wt Readings from Last 3 Encounters:  04/01/19 131 lb 9.6 oz (59.7 kg)  02/24/19 137 lb 6.4 oz (62.3 kg)  02/05/19 140 lb (63.5 kg)     Health Maintenance Due  Topic Date Due  . Hepatitis C Screening  04-11-1946  . MAMMOGRAM  01/23/1997  . COLONOSCOPY  01/23/1997  . TETANUS/TDAP  07/16/2002  . DEXA SCAN  01/24/2012  . PNA vac Low Risk Adult (2 of 2 - PCV13) 07/29/2015  . INFLUENZA VACCINE  02/14/2019    Lab Results  Component Value Date   TSH 2.344 01/25/2019   Lab Results  Component Value Date   WBC 4.9 02/09/2019   HGB 9.6 (L) 02/09/2019   HCT 30.0 (L) 02/09/2019   MCV 97.7 02/09/2019   PLT 325 02/09/2019   Lab Results  Component Value Date   NA 139 02/09/2019   K 3.2 (L) 02/09/2019   CO2 25 02/09/2019   GLUCOSE 81 02/09/2019   BUN 6 (L) 02/09/2019   CREATININE 0.56 02/09/2019   BILITOT 1.1 02/06/2019   ALKPHOS 55 02/06/2019   AST 14 (L) 02/06/2019   ALT 9 02/06/2019   PROT 6.5 02/06/2019   ALBUMIN 2.8 (L) 02/06/2019   CALCIUM 8.8 (L) 02/09/2019   ANIONGAP 8 02/09/2019   Assessment & Plan:  1. Joint swelling bilat ankle-rheumatology referral Andersyn Fragoso 02/11/2019, MD

## 2019-04-06 ENCOUNTER — Other Ambulatory Visit: Payer: Self-pay

## 2019-04-06 ENCOUNTER — Telehealth: Payer: Self-pay

## 2019-04-06 ENCOUNTER — Ambulatory Visit: Payer: 59 | Admitting: Rheumatology

## 2019-04-06 ENCOUNTER — Encounter: Payer: Self-pay | Admitting: Rheumatology

## 2019-04-06 VITALS — BP 110/61 | HR 66 | Resp 13 | Ht 58.5 in | Wt 131.0 lb

## 2019-04-06 DIAGNOSIS — R531 Weakness: Secondary | ICD-10-CM | POA: Diagnosis not present

## 2019-04-06 DIAGNOSIS — E538 Deficiency of other specified B group vitamins: Secondary | ICD-10-CM

## 2019-04-06 DIAGNOSIS — G9341 Metabolic encephalopathy: Secondary | ICD-10-CM

## 2019-04-06 DIAGNOSIS — R768 Other specified abnormal immunological findings in serum: Secondary | ICD-10-CM

## 2019-04-06 DIAGNOSIS — M2550 Pain in unspecified joint: Secondary | ICD-10-CM

## 2019-04-06 DIAGNOSIS — F0390 Unspecified dementia without behavioral disturbance: Secondary | ICD-10-CM

## 2019-04-06 DIAGNOSIS — I951 Orthostatic hypotension: Secondary | ICD-10-CM | POA: Diagnosis not present

## 2019-04-06 DIAGNOSIS — D649 Anemia, unspecified: Secondary | ICD-10-CM | POA: Diagnosis not present

## 2019-04-06 DIAGNOSIS — E876 Hypokalemia: Secondary | ICD-10-CM | POA: Diagnosis not present

## 2019-04-06 MED ORDER — QUETIAPINE FUMARATE 25 MG PO TABS: 25.0000 mg | ORAL_TABLET | Freq: Every day | ORAL | 0 refills | Status: DC

## 2019-04-06 NOTE — Telephone Encounter (Signed)
Wendy Mccann, CMA  

## 2019-04-07 NOTE — Progress Notes (Signed)
I will discuss results at the follow-up visit.

## 2019-04-08 DIAGNOSIS — M2540 Effusion, unspecified joint: Secondary | ICD-10-CM | POA: Insufficient documentation

## 2019-04-08 LAB — C3 AND C4
C3 Complement: 158 mg/dL (ref 83–193)
C4 Complement: 61 mg/dL — ABNORMAL HIGH (ref 15–57)

## 2019-04-08 LAB — ANA: Anti Nuclear Antibody (ANA): POSITIVE — AB

## 2019-04-08 LAB — ANTI-NUCLEAR AB-TITER (ANA TITER)
ANA TITER: 1:1280 {titer} — ABNORMAL HIGH
ANA TITER: 1:1280 {titer} — ABNORMAL HIGH
ANA Titer 1: 1:320 {titer} — ABNORMAL HIGH

## 2019-04-08 LAB — GLUCOSE 6 PHOSPHATE DEHYDROGENASE: G-6PDH: 15.4 U/g Hgb (ref 7.0–20.5)

## 2019-04-08 LAB — SJOGRENS SYNDROME-A EXTRACTABLE NUCLEAR ANTIBODY: SSA (Ro) (ENA) Antibody, IgG: >8 AI — AB

## 2019-04-08 LAB — SEDIMENTATION RATE: Sed Rate: 72 mm/h — ABNORMAL HIGH (ref 0–30)

## 2019-04-08 LAB — ANTI-DNA ANTIBODY, DOUBLE-STRANDED: ds DNA Ab: 1 IU/mL

## 2019-04-08 LAB — CYCLIC CITRUL PEPTIDE ANTIBODY, IGG: Cyclic Citrullin Peptide Ab: 20 UNITS — ABNORMAL HIGH

## 2019-04-08 LAB — SJOGRENS SYNDROME-B EXTRACTABLE NUCLEAR ANTIBODY: SSB (La) (ENA) Antibody, IgG: <1 AI

## 2019-04-08 LAB — ANTI-SCLERODERMA ANTIBODY: Scleroderma (Scl-70) (ENA) Antibody, IgG: <1 AI

## 2019-04-08 LAB — RNP ANTIBODY: Ribonucleic Protein(ENA) Antibody, IgG: <1 AI

## 2019-04-08 LAB — ANTI-SMITH ANTIBODY: ENA SM Ab Ser-aCnc: <1 AI

## 2019-04-09 NOTE — Progress Notes (Signed)
Office Visit Note  Patient: Wendy Mccann             Date of Birth: 1946-01-08           MRN: [id number]             PCP: Maryruth Hancock, MD Referring: Maryruth Hancock, MD Visit Date: 04/23/2019 Occupation: @GUAROCC @  Subjective:  Right wrist pain    History of Present Illness: Wendy Mccann is a 73 y.o. female with history of seropositive rheumatoid arthritis.  She presents today with right wrist joint pain and swelling.  She has trapezius muscle tenderness bilaterally.  She is also having right ankle joint pain and swelling.  She states she fractured her right ankle joint in the past.  She denies any other joint pain or joint swelling. Denies sicca symptoms. She denies any other new or worsening symptoms.    Activities of Daily Living:  Patient reports joint stiffness all day Patient Denies nocturnal pain.  Difficulty dressing/grooming: Denies Difficulty climbing stairs: Reports Difficulty getting out of chair: Reports Difficulty using hands for taps, buttons, cutlery, and/or writing: Denies  Review of Systems  Constitutional: Positive for fatigue.  HENT: Negative for mouth sores, mouth dryness and nose dryness.   Eyes: Negative for pain, itching, visual disturbance and dryness.  Respiratory: Negative for cough, hemoptysis, shortness of breath, wheezing and difficulty breathing.   Cardiovascular: Negative for chest pain, palpitations, hypertension and swelling in legs/feet.  Gastrointestinal: Negative for abdominal pain, blood in stool, constipation and diarrhea.  Endocrine: Negative for increased urination.  Genitourinary: Negative for difficulty urinating and painful urination.  Musculoskeletal: Positive for arthralgias, joint pain, joint swelling and morning stiffness. Negative for myalgias, muscle weakness, muscle tenderness and myalgias.  Skin: Negative for color change, pallor, rash, hair loss, nodules/bumps, skin tightness, ulcers and sensitivity to sunlight.    Allergic/Immunologic: Negative for susceptible to infections.  Neurological: Positive for memory loss and weakness. Negative for dizziness.  Hematological: Negative for bruising/bleeding tendency and swollen glands.  Psychiatric/Behavioral: Negative for depressed mood and sleep disturbance. The patient is not nervous/anxious.     PMFS History:  Patient Active Problem List   Diagnosis Date Noted   Joint swelling 04/08/2019   Elevated rheumatoid factor 02/24/2019   B12 deficiency 02/24/2019   Dementia without behavioral disturbance ([place]) 02/07/2019   Rt Elbow Arthritis 39/68/8648   Acute metabolic encephalopathy 47/20/7218   Normocytic anemia 01/23/2019   Hypokalemia 01/22/2019   Volume depletion 01/22/2019   Hyperbilirubinemia 01/22/2019   Generalized weakness 08/12/2016   Orthostatic hypotension 08/12/2016   Abdominal pain 08/12/2016   Cough 08/12/2016   Dehydration, mild 08/12/2016    Past Medical History:  Diagnosis Date   Alzheimer's dementia ([place])    Anemia    Medical history non-contributory    Rotator cuff (capsule) sprain     Family History  Problem Relation Age of Onset   Gout Father    Stroke Father    Heart attack Father    Dementia Mother    Past Surgical History:  Procedure Laterality Date   ankles Bilateral    Social History   Social History Narrative   Not on file   Immunization History  Administered Date(s) Administered   Influenza-Unspecified 04/03/2018   Pneumococcal Polysaccharide-23 07/28/2014   Tdap 07/16/1992     Objective: Vital Signs: BP (!) 111/55 (BP Location: Left Arm, Patient Position: Sitting, Cuff Size: Normal)    Pulse 65    Resp 14  Ht 4' 10.5" (1.486 m)    Wt 129 lb (58.5 kg)    BMI 26.50 kg/m    Physical Exam Vitals signs and nursing note reviewed.  Constitutional:      Appearance: She is well-developed.  HENT:     Head: Normocephalic and atraumatic.  Eyes:     Conjunctiva/sclera:  Conjunctivae normal.  Neck:     Musculoskeletal: Normal range of motion.  Cardiovascular:     Rate and Rhythm: Normal rate and regular rhythm.     Heart sounds: Normal heart sounds.  Pulmonary:     Effort: Pulmonary effort is normal.     Breath sounds: Normal breath sounds.  Abdominal:     General: Bowel sounds are normal.     Palpations: Abdomen is soft.  Lymphadenopathy:     Cervical: No cervical adenopathy.  Skin:    General: Skin is warm and dry.     Capillary Refill: Capillary refill takes less than 2 seconds.  Neurological:     Mental Status: She is alert and oriented to person, place, and time.  Psychiatric:        Behavior: Behavior normal.      Musculoskeletal Exam: C-spine limited ROM. Thoracic and lumbar spine good ROM.  No midline spinal tenderness.  No SI joint tenderness. Trapezius muscle tenderness bilaterally. She has painful full ROM of the right shoulder joint. Left shoulder good ROM. Elbow joints, wrist joints, MCPs, PIPs, and DIPs good ROM. Tenderness and warmth over the right wrist joint.  CMC joint synovial thickening.  PIP and DIP joint synovial thickening but no synovitis.  Knee joints good ROM with no warmth or effusion. Tenderness and inflammation of the right ankle joint. Left ankle joint good ROM with no tenderness or inflammation.   CDAI Exam: CDAI Score: -- Patient Global: --; Provider Global: -- Swollen: 2 ; Tender: 2  Joint Exam      Right  Left  Wrist  Swollen Tender     Ankle  Swollen Tender        Investigation: No additional findings.  Imaging: No results found.  Recent Labs: Lab Results  Component Value Date   WBC 4.9 02/09/2019   HGB 9.6 (L) 02/09/2019   PLT 325 02/09/2019   NA 139 02/09/2019   K 3.2 (L) 02/09/2019   CL 106 02/09/2019   CO2 25 02/09/2019   GLUCOSE 81 02/09/2019   BUN 6 (L) 02/09/2019   CREATININE 0.56 02/09/2019   BILITOT 1.1 02/06/2019   ALKPHOS 55 02/06/2019   AST 14 (L) 02/06/2019   ALT 9 02/06/2019    PROT 6.5 02/06/2019   ALBUMIN 2.8 (L) 02/06/2019   CALCIUM 8.8 (L) 02/09/2019   GFRAA >60 02/09/2019  April 06, 2019 ANA 1: 1280 nucleolar, 1: 320 speckled, +Ro, (rest the ENA negative, C3 normal, C4 normal, ESR 72, anti-CCP 20, G6PD normal  Speciality Comments: No specialty comments available.  Procedures:  No procedures performed Allergies: Patient has no known allergies.   Assessment / Plan:     Visit Diagnoses: Rheumatoid arthritis involving multiple sites with positive rheumatoid factor (HCC) - Positive RF, positive anti-CCP, elevated ESR, positive ANA, positive Ro.  History of episodic joint swelling: She presents today with tenderness and warmth of the right wrist joint.  She has also having increased discomfort and mild inflammation of the right ankle joint which she previously fractured.  We discussed the recent lab work from 04/06/2019.  All questions were addressed.  We discussed the diagnosis  of rheumatoid arthritis and the significance of a positive ANA and positive Ro.  We discussed starting her on Plaquenil 2 mg 1 tablet twice daily Monday through Friday.  Indications, contraindications, potential side effects of Plaquenil were discussed.  All questions were addressed and consent was obtained today.  Standing orders for lab work were placed today.  She will return for lab work in 1 month then 3 months and every 5 months.  She was advised to notify us if she cannot tolerate taking Plaquenil.  She will follow-up in the office for 1 month follow-up.  - Plan: hydroxychloroquine (PLAQUENIL) 200 MG tablet  Patient was counseled on the purpose, proper use, and adverse effects of hydroxychloroquine including nausea/diarrhea, skin rash, headaches, and sun sensitivity.  Discussed importance of annual eye exams while on hydroxychloroquine to monitor to ocular toxicity and discussed importance of frequent laboratory monitoring.  Provided patient with eye exam form for baseline ophthalmologic  exam.  Provided patient with educational materials on hydroxychloroquine and answered all questions.  Patient consented to hydroxychloroquine.  Will upload consent in the media tab.    Dose will be Plaquenil 200 mg twice daily Monday through Friday.   High risk medication use -Plaquenil 200 mg 1 tablet by mouth daily M-F only.   Plan: CBC with Differential/Platelet, COMPLETE METABOLIC PANEL WITH GFR  Other medical conditions are listed as follows:   Acute metabolic encephalopathy  Dementia without behavioral disturbance, unspecified dementia type (HCC)  Orthostatic hypotension  Normocytic anemia  Hyperbilirubinemia  Hypokalemia  B12 deficiency  Generalized weakness   Orders: Orders Placed This Encounter  Procedures   CBC with Differential/Platelet   COMPLETE METABOLIC PANEL WITH GFR   Meds ordered this encounter  Medications   hydroxychloroquine (PLAQUENIL) 200 MG tablet    Sig: Take 1 tablet 200 mg BID Monday-Friday    Dispense:  120 tablet    Refill:  0    Face-to-face time spent with patient was 30 minutes. Greater than 50% of time was spent in counseling and coordination of care.  Follow-Up Instructions: Return in about 4 weeks (around 05/21/2019) for Rheumatoid arthritis.   Ofilia Neas, PA-C   I examined and evaluated the patient with Hazel Sams PA.  Patient has been experiencing joint pain and joint swelling.  I detailed discussion regarding the lab work with patient and her brother.  After different treatment options and their side effects were discussed we decided to place her on Plaquenil.  Patient was in agreement to proceed with the medication.  The plan of care was discussed as noted above.  Bo Merino, MD  Note - This record has been created using Editor, commissioning.  Chart creation errors have been sought, but may not always  have been located. Such creation errors do not reflect on  the standard of medical care.

## 2019-04-13 ENCOUNTER — Telehealth: Payer: Self-pay | Admitting: Family Medicine

## 2019-04-13 NOTE — Telephone Encounter (Signed)
Important to keep appointment with neurologist. Persons with dementia often have anxiety, depression and think mistakenly that someone has stolen items when they have misplaced them.  They can become difficulty to manage in the household since they forget who family members are and can mistake them for someone else-ie intruder, some to harm , not help. Likely the neurologist will recommend additional medication if pt is tolerating what we started

## 2019-04-13 NOTE — Telephone Encounter (Signed)
Routing to Dr. Corum for advice ? 

## 2019-04-13 NOTE — Telephone Encounter (Signed)
Patient sister is calling, Wendy Mccann, and states that the patient has been having panic attacks and migraines because she has lost her ID and she is thinking someone came into her house and stole it. She told her brother to get out the house because she thinks he stole it. She then allowed him back to stay. She is wanting to know if she needs something stronger for her dementia. Please contact back [phone]

## 2019-04-14 NOTE — Telephone Encounter (Signed)
Wendy Mccann sister is aware of recommendation and agrees

## 2019-04-23 ENCOUNTER — Ambulatory Visit (INDEPENDENT_AMBULATORY_CARE_PROVIDER_SITE_OTHER): Payer: 59

## 2019-04-23 ENCOUNTER — Ambulatory Visit (INDEPENDENT_AMBULATORY_CARE_PROVIDER_SITE_OTHER): Payer: 59 | Admitting: Rheumatology

## 2019-04-23 ENCOUNTER — Encounter: Payer: Self-pay | Admitting: Rheumatology

## 2019-04-23 ENCOUNTER — Other Ambulatory Visit: Payer: Self-pay

## 2019-04-23 VITALS — BP 111/55 | HR 65 | Resp 14 | Ht 58.5 in | Wt 129.0 lb

## 2019-04-23 VITALS — BP 129/74 | Ht 61.0 in | Wt 131.0 lb

## 2019-04-23 DIAGNOSIS — R531 Weakness: Secondary | ICD-10-CM

## 2019-04-23 DIAGNOSIS — F0390 Unspecified dementia without behavioral disturbance: Secondary | ICD-10-CM

## 2019-04-23 DIAGNOSIS — I951 Orthostatic hypotension: Secondary | ICD-10-CM | POA: Diagnosis not present

## 2019-04-23 DIAGNOSIS — E538 Deficiency of other specified B group vitamins: Secondary | ICD-10-CM

## 2019-04-23 DIAGNOSIS — G9341 Metabolic encephalopathy: Secondary | ICD-10-CM | POA: Diagnosis not present

## 2019-04-23 DIAGNOSIS — Z23 Encounter for immunization: Secondary | ICD-10-CM | POA: Diagnosis not present

## 2019-04-23 DIAGNOSIS — R413 Other amnesia: Secondary | ICD-10-CM

## 2019-04-23 DIAGNOSIS — E876 Hypokalemia: Secondary | ICD-10-CM

## 2019-04-23 DIAGNOSIS — M0579 Rheumatoid arthritis with rheumatoid factor of multiple sites without organ or systems involvement: Secondary | ICD-10-CM | POA: Diagnosis not present

## 2019-04-23 DIAGNOSIS — Z79899 Other long term (current) drug therapy: Secondary | ICD-10-CM

## 2019-04-23 DIAGNOSIS — D649 Anemia, unspecified: Secondary | ICD-10-CM

## 2019-04-23 MED ORDER — HYDROXYCHLOROQUINE SULFATE 200 MG PO TABS: ORAL_TABLET | ORAL | 0 refills | Status: AC

## 2019-04-23 NOTE — Progress Notes (Signed)
Pharmacy Note  Subjective: Patient presents today to the [facility] to see Dr. Estanislado Pandy.  Patient seen by the pharmacist for counseling on hydroxychloroquine rheumatoid arthritis.  She is naive to therapy.  Objective: CMP     Component Value Date/Time   NA 139 02/09/2019 0620   K 3.2 (L) 02/09/2019 0620   CL 106 02/09/2019 0620   CO2 25 02/09/2019 0620   GLUCOSE 81 02/09/2019 0620   BUN 6 (L) 02/09/2019 0620   CREATININE 0.56 02/09/2019 0620   CALCIUM 8.8 (L) 02/09/2019 0620   PROT 6.5 02/06/2019 0718   ALBUMIN 2.8 (L) 02/06/2019 0718   AST 14 (L) 02/06/2019 0718   ALT 9 02/06/2019 0718   ALKPHOS 55 02/06/2019 0718   BILITOT 1.1 02/06/2019 0718   GFRNONAA >60 02/09/2019 0620   GFRAA >60 02/09/2019 0620    CBC    Component Value Date/Time   WBC 4.9 02/09/2019 0620   RBC 3.07 (L) 02/09/2019 0620   HGB 9.6 (L) 02/09/2019 0620   HCT 30.0 (L) 02/09/2019 0620   PLT 325 02/09/2019 0620   MCV 97.7 02/09/2019 0620   MCH 31.3 02/09/2019 0620   MCHC 32.0 02/09/2019 0620   RDW 12.0 02/09/2019 0620   LYMPHSABS 1.8 02/05/2019 2000   MONOABS 1.9 (H) 02/05/2019 2000   EOSABS 0.0 02/05/2019 2000   BASOSABS 0.0 02/05/2019 2000    Assessment/Plan: Patient was counseled on the purpose, proper use, and adverse effects of hydroxychloroquine including nausea/diarrhea, skin rash, headaches, and sun sensitivity.  Discussed importance of annual eye exams while on hydroxychloroquine to monitor to ocular toxicity and discussed importance of frequent laboratory monitoring.  Provided patient with eye exam form for baseline ophthalmologic exam and standing lab instructions.  Provided patient with educational materials on hydroxychloroquine and answered all questions.  Patient consented to hydroxychloroquine.  Will upload consent in the media tab.     Dose will be Plaquenil 200 mg twice daily Monday through Friday.  Prescription pending lab results.  All questions encouraged and  answered.  Instructed patient to call with any questions or concerns.  Mariella Saa, PharmD, [place], [place] [facility] Pharmacist [phone]  04/23/2019 12:05 PM

## 2019-04-23 NOTE — Patient Instructions (Addendum)
Hydroxychloroquine tablets What is this medicine? HYDROXYCHLOROQUINE (hye drox ee KLOR oh kwin) is used to treat rheumatoid arthritis and systemic lupus erythematosus. It is also used to treat malaria. This medicine may be used for other purposes; ask your health care provider or pharmacist if you have questions. COMMON BRAND NAME(S): Plaquenil, Quineprox What should I tell my health care provider before I take this medicine? They need to know if you have any of these conditions:  diabetes  eye disease, vision problems  G6PD deficiency  heart disease  history of irregular heartbeat  if you often drink alcohol  kidney disease  liver disease  porphyria  psoriasis  an unusual or allergic reaction to chloroquine, hydroxychloroquine, other medicines, foods, dyes, or preservatives  pregnant or trying to get pregnant  breast-feeding How should I use this medicine? Take this medicine by mouth with a glass of water. Follow the directions on the prescription label. Do not cut, crush or chew this medicine. Swallow the tablets whole. Take this medicine with food. Avoid taking antacids within 4 hours of taking this medicine. It is best to separate these medicines by at least 4 hours. Take your medicine at regular intervals. Do not take it more often than directed. Take all of your medicine as directed even if you think you are better. Do not skip doses or stop your medicine early. Talk to your pediatrician regarding the use of this medicine in children. While this drug may be prescribed for selected conditions, precautions do apply. Overdosage: If you think you have taken too much of this medicine contact a poison control center or emergency room at once. NOTE: This medicine is only for you. Do not share this medicine with others. What if I miss a dose? If you miss a dose, take it as soon as you can. If it is almost time for your next dose, take only that dose. Do not take double or extra  doses. What may interact with this medicine? Do not take this medicine with any of the following medications:  cisapride  dronedarone  pimozide  thioridazine This medicine may also interact with the following medications:  ampicillin  antacids  cimetidine  cyclosporine  digoxin  kaolin  medicines for diabetes, like insulin, glipizide, glyburide  medicines for seizures like carbamazepine, phenobarbital, phenytoin  mefloquine  methotrexate  other medicines that prolong the QT interval (cause an abnormal heart rhythm)  praziquantel This list may not describe all possible interactions. Give your health care provider a list of all the medicines, herbs, non-prescription drugs, or dietary supplements you use. Also tell them if you smoke, drink alcohol, or use illegal drugs. Some items may interact with your medicine. What should I watch for while using this medicine? Visit your health care professional for regular checks on your progress. Tell your health care professional if your symptoms do not start to get better or if they get worse. You may need blood work done while you are taking this medicine. If you take other medicines that can affect heart rhythm, you may need more testing. Talk to your health care professional if you have questions. Your vision may be tested before and during use of this medicine. Tell your health care professional right away if you have any change in your eyesight. What side effects may I notice from receiving this medicine? Side effects that you should report to your doctor or health care professional as soon as possible:  allergic reactions like skin rash, itching or hives,   swelling of the face, lips, or tongue  changes in vision  decreased hearing or ringing of the ears  muscle weakness  redness, blistering, peeling or loosening of the skin, including inside the mouth  sensitivity to light  signs and symptoms of a dangerous change in  heartbeat or heart rhythm like chest pain; dizziness; fast or irregular heartbeat; palpitations; feeling faint or lightheaded, falls; breathing problems  signs and symptoms of liver injury like dark yellow or brown urine; general ill feeling or flu-like symptoms; light-colored stools; loss of appetite; nausea; right upper belly pain; unusually weak or tired; yellowing of the eyes or skin  signs and symptoms of low blood sugar such as feeling anxious; confusion; dizziness; increased hunger; unusually weak or tired; sweating; shakiness; cold; irritable; headache; blurred vision; fast heartbeat; loss of consciousness  suicidal thoughts  uncontrollable head, mouth, neck, arm, or leg movements Side effects that usually do not require medical attention (report to your doctor or health care professional if they continue or are bothersome):  diarrhea  dizziness  hair loss  headache  irritable  loss of appetite  nausea, vomiting  stomach pain This list may not describe all possible side effects. Call your doctor for medical advice about side effects. You may report side effects to FDA at 1-800-FDA-1088. Where should I keep my medicine? Keep out of the reach of children. Store at room temperature between 15 and 30 degrees C (59 and 86 degrees F). Protect from moisture and light. Throw away any unused medicine after the expiration date. NOTE: This sheet is a summary. It may not cover all possible information. If you have questions about this medicine, talk to your doctor, pharmacist, or health care provider.  2020 Elsevier/Gold Standard (2018-11-10 12:56:32) Standing Labs We placed an order today for your standing lab work.    Please come back and get your standing labs in 1 month, 3 month, and then every 5 months.  We have open lab daily Monday through Thursday from 8:30-12:30 PM and 1:30-4:30 PM and Friday from 8:30-12:30 PM and 1:30-4:00 PM at the office of Dr. Pollyann SavoyShaili Deveshwar.    You may experience shorter wait times on Monday and Friday afternoons. The office is located at [address], [address], [place], [place] [address] No appointment is necessary.   Labs are drawn by [facility].  You may receive a bill from [facility] for your lab work.  If you wish to have your labs drawn at another location, please call the office 24 hours in advance to send orders.  If you have any questions regarding directions or hours of operation,  please call [phone].   Just as a reminder please drink plenty of water prior to coming for your lab work. Thanks! Vaccines You are taking a medication(s) that can suppress your immune system.  The following immunizations are recommended: . Flu annually . Pneumonia (Pneumovax 23 and Prevnar 13 spaced at least 1 year apart) . Shingrix  Please check with your PCP to make sure you are up to date.  Hydroxychloroquine tablets What is this medicine? HYDROXYCHLOROQUINE (hye drox ee KLOR oh kwin) is used to treat rheumatoid arthritis and systemic lupus erythematosus. It is also used to treat malaria. This medicine may be used for other purposes; ask your health care provider or pharmacist if you have questions. COMMON BRAND NAME(S): Plaquenil, Quineprox What should I tell my health care provider before I take this medicine? They need to know if you have any of these conditions:  diabetes  eye disease, vision problems  G6PD deficiency  heart disease  history of irregular heartbeat  if you often drink alcohol  kidney disease  liver disease  porphyria  psoriasis  an unusual or allergic reaction to chloroquine, hydroxychloroquine, other medicines, foods, dyes, or preservatives  pregnant or trying to get pregnant  breast-feeding How should I use this medicine? Take this medicine by mouth with a glass of water. Follow the directions on the prescription label. Do not cut, crush or chew this medicine. Swallow the tablets whole.  Take this medicine with food. Avoid taking antacids within 4 hours of taking this medicine. It is best to separate these medicines by at least 4 hours. Take your medicine at regular intervals. Do not take it more often than directed. Take all of your medicine as directed even if you think you are better. Do not skip doses or stop your medicine early. Talk to your pediatrician regarding the use of this medicine in children. While this drug may be prescribed for selected conditions, precautions do apply. Overdosage: If you think you have taken too much of this medicine contact a poison control center or emergency room at once. NOTE: This medicine is only for you. Do not share this medicine with others. What if I miss a dose? If you miss a dose, take it as soon as you can. If it is almost time for your next dose, take only that dose. Do not take double or extra doses. What may interact with this medicine? Do not take this medicine with any of the following medications:  cisapride  dronedarone  pimozide  thioridazine This medicine may also interact with the following medications:  ampicillin  antacids  cimetidine  cyclosporine  digoxin  kaolin  medicines for diabetes, like insulin, glipizide, glyburide  medicines for seizures like carbamazepine, phenobarbital, phenytoin  mefloquine  methotrexate  other medicines that prolong the QT interval (cause an abnormal heart rhythm)  praziquantel This list may not describe all possible interactions. Give your health care provider a list of all the medicines, herbs, non-prescription drugs, or dietary supplements you use. Also tell them if you smoke, drink alcohol, or use illegal drugs. Some items may interact with your medicine. What should I watch for while using this medicine? Visit your health care professional for regular checks on your progress. Tell your health care professional if your symptoms do not start to get better or if they  get worse. You may need blood work done while you are taking this medicine. If you take other medicines that can affect heart rhythm, you may need more testing. Talk to your health care professional if you have questions. Your vision may be tested before and during use of this medicine. Tell your health care professional right away if you have any change in your eyesight. What side effects may I notice from receiving this medicine? Side effects that you should report to your doctor or health care professional as soon as possible:  allergic reactions like skin rash, itching or hives, swelling of the face, lips, or tongue  changes in vision  decreased hearing or ringing of the ears  muscle weakness  redness, blistering, peeling or loosening of the skin, including inside the mouth  sensitivity to light  signs and symptoms of a dangerous change in heartbeat or heart rhythm like chest pain; dizziness; fast or irregular heartbeat; palpitations; feeling faint or lightheaded, falls; breathing problems  signs and symptoms of liver injury like dark yellow  or brown urine; general ill feeling or flu-like symptoms; light-colored stools; loss of appetite; nausea; right upper belly pain; unusually weak or tired; yellowing of the eyes or skin  signs and symptoms of low blood sugar such as feeling anxious; confusion; dizziness; increased hunger; unusually weak or tired; sweating; shakiness; cold; irritable; headache; blurred vision; fast heartbeat; loss of consciousness  suicidal thoughts  uncontrollable head, mouth, neck, arm, or leg movements Side effects that usually do not require medical attention (report to your doctor or health care professional if they continue or are bothersome):  diarrhea  dizziness  hair loss  headache  irritable  loss of appetite  nausea, vomiting  stomach pain This list may not describe all possible side effects. Call your doctor for medical advice about side  effects. You may report side effects to FDA at 1-800-FDA-1088. Where should I keep my medicine? Keep out of the reach of children. Store at room temperature between 15 and 30 degrees C (59 and 86 degrees F). Protect from moisture and light. Throw away any unused medicine after the expiration date. NOTE: This sheet is a summary. It may not cover all possible information. If you have questions about this medicine, talk to your doctor, pharmacist, or health care provider.  2020 Elsevier/Gold Standard (2018-11-10 12:56:32)

## 2019-04-27 NOTE — Progress Notes (Deleted)
LeighAnn Zachory Mangual, CMA  

## 2019-04-28 ENCOUNTER — Telehealth: Payer: Self-pay | Admitting: Neurology

## 2019-04-28 ENCOUNTER — Other Ambulatory Visit: Payer: Self-pay

## 2019-04-28 ENCOUNTER — Encounter: Payer: Self-pay | Admitting: Neurology

## 2019-04-28 ENCOUNTER — Ambulatory Visit (INDEPENDENT_AMBULATORY_CARE_PROVIDER_SITE_OTHER): Payer: 59 | Admitting: Neurology

## 2019-04-28 VITALS — BP 126/78 | HR 64 | Temp 97.5°F | Ht 58.5 in | Wt 129.5 lb

## 2019-04-28 DIAGNOSIS — R413 Other amnesia: Secondary | ICD-10-CM

## 2019-04-28 DIAGNOSIS — F0390 Unspecified dementia without behavioral disturbance: Secondary | ICD-10-CM

## 2019-04-28 DIAGNOSIS — E538 Deficiency of other specified B group vitamins: Secondary | ICD-10-CM | POA: Insufficient documentation

## 2019-04-28 MED ORDER — DONEPEZIL HCL 10 MG PO TABS: 10.0000 mg | ORAL_TABLET | Freq: Every day | ORAL | 4 refills | Status: DC

## 2019-04-28 MED ORDER — MEMANTINE HCL 10 MG PO TABS: 10.0000 mg | ORAL_TABLET | Freq: Two times a day (BID) | ORAL | 4 refills | Status: DC

## 2019-04-28 NOTE — Progress Notes (Signed)
PATIENT: Wendy Mccann DOB: 1946/01/14  Chief Complaint  Patient presents with  . Dementia    MMSE 19/30 - 8 animals.  She is here with her sister, Wendy Mccann.  Reports worsening memory and forgetfulness.   [name] PCP    Corum, Rex Kras, MD     HISTORICAL  Wendy Mccann is a 73 year old female, seen in request by her primary care physician Dr. Holly Bodily, Lattie Haw for evaluation of memory loss, she is accompanied by her sister Haynes Dage at today's visit on April 28, 2019.  I have reviewed and summarized the referring note from the referring physician.  She has past medical history of anemia, she has past medical history of autoimmune disease, polyarthralgia, is under the care of rheumatologist Dr. Estanislado Pandy  She was hospitalized in July 2020 for right wrist swelling, hallucinations, prior to that, she worked at the hospital nutritional service, still work part-time job.   she has never driven in her life, graduated from high school. She lives alone since her husband passed away no children, there was strong family history of dementia, mother, parents both suffered dementia.  She is 1 of 17 children of her parents.  Currently her brother and her sister are involved in her care.  She sleeps well, no hallucinations, has good appetite.  She started to have memory loss around 2019, tends to repeat herself become more obvious since most recent hospital admission, Mini-Mental Status Examination 19 out of 30,  Laboratory evaluation showed positive rheumatoid factor, ANA 1-320, nucleolar, 1:1280 nuclear dot., sed rate 90, cyclic citrul peptide antibody IgG  RPR was negative, TSH was normal 2.3, B12 was 189, ferritin 214, iron was only 10, saturation rate was 4  I personally reviewed CT head without contrast, supratentorium small vessel disease  REVIEW OF SYSTEMS: Full 14 system review of systems performed and notable only for as above All other review of systems were negative.  ALLERGIES: No Known  Allergies  HOME MEDICATIONS: Current Outpatient Medications  Medication Sig Dispense Refill  . acetaminophen (TYLENOL) 325 MG tablet Take 2 tablets (650 mg total) by mouth every 6 (six) hours as needed for mild pain, fever or headache (or Fever >/= 101). 12 tablet 0  . donepezil (ARICEPT) 5 MG tablet Take 1 tablet (5 mg total) by mouth at bedtime. 30 tablet 0  . ferrous sulfate 325 (65 FE) MG tablet Take 1 tablet (325 mg total) by mouth daily with breakfast. 30 tablet 0  . hydroxychloroquine (PLAQUENIL) 200 MG tablet Take 1 tablet 200 mg BID Monday-Friday 120 tablet 0  . Polyethylene Glycol 3350 (MIRALAX PO) Take by mouth as needed.    [name] QUEtiapine (SEROQUEL) 25 MG tablet Take 1 tablet (25 mg total) by mouth at bedtime. 30 tablet 0  . senna-docusate (SENOKOT-S) 8.6-50 MG tablet Take 2 tablets by mouth at bedtime. 60 tablet 1   Current Facility-Administered Medications  Medication Dose Route Frequency Provider Last Rate Last Dose  . cyanocobalamin ((VITAMIN B-12)) injection 1,000 mcg  1,000 mcg Intramuscular Q30 days Maryruth Hancock, MD   1,000 mcg at 02/24/19 0530    PAST MEDICAL HISTORY: Past Medical History:  Diagnosis Date  . Alzheimer's dementia ([place])   . Anemia   . Medical history non-contributory   . Rotator cuff (capsule) sprain     PAST SURGICAL HISTORY: Past Surgical History:  Procedure Laterality Date  . ankles Bilateral     FAMILY HISTORY: Family History  Problem Relation Age of Onset  .  Gout Father   . Stroke Father   . Heart attack Father   . Dementia Mother     SOCIAL HISTORY: Social History   Socioeconomic History  . Marital status: Single    Spouse name: Not on file  . Number of children: 0  . Years of education: 20  . Highest education level: High school graduate  Occupational History  . Occupation: Retired  Engineer, production  . Financial resource strain: Not on file  . Food insecurity    Worry: Not on file    Inability: Not on file  .  Transportation needs    Medical: Not on file    Non-medical: Not on file  Tobacco Use  . Smoking status: Never Smoker  . Smokeless tobacco: Never Used  Substance and Sexual Activity  . Alcohol use: No  . Drug use: No  . Sexual activity: Not on file  Lifestyle  . Physical activity    Days per week: Not on file    Minutes per session: Not on file  . Stress: Not on file  Relationships  . Social Musician on phone: Not on file    Gets together: Not on file    Attends religious service: Not on file    Active member of club or organization: Not on file    Attends meetings of clubs or organizations: Not on file    Relationship status: Not on file  . Intimate partner violence    Fear of current or ex partner: Not on file    Emotionally abused: Not on file    Physically abused: Not on file    Forced sexual activity: Not on file  Other Topics Concern  . Not on file  Social History Narrative   Lives alone.   Right-handed.   No daily caffeine use.     PHYSICAL EXAM   Vitals:   04/28/19 0812  BP: 126/78  Pulse: 64  Temp: (!) 97.5 F (36.4 C)  Weight: 129 lb 8 oz (58.7 kg)  Height: 4' 10.5" (1.486 m)    Not recorded      Body mass index is 26.6 kg/m.  PHYSICAL EXAMNIATION:  Gen: NAD, conversant, well nourised, well groomed                     Cardiovascular: Regular rate rhythm, no peripheral edema, warm, nontender. Eyes: Conjunctivae clear without exudates or hemorrhage Neck: Supple, no carotid bruits. Pulmonary: Clear to auscultation bilaterally   NEUROLOGICAL EXAM:  MMSE - Mini Mental State Exam 04/28/2019 02/24/2019  Not completed: - Unable to complete  Orientation to time 3 0  Orientation to Place 4 0  Registration 3 0  Attention/ Calculation 0 0  Recall 0 3  Language- name 2 objects 2 2  Language- repeat 1 1  Language- follow 3 step command 3 0  Language- read & follow direction 1 1  Write a sentence 1 0  Copy design 1 1  Total score 19  8  animal naming 8.   CRANIAL NERVES:  CN II: Visual fields are full to confrontation.   Pupils are round equal and briskly reactive to light. CN III, IV, VI: extraocular movement are normal. No ptosis. CN V: Facial sensation is intact to pinprick in all 3 divisions bilaterally. Corneal responses are intact.  CN VII: Face is symmetric with normal eye closure and smile. CN VIII: Hearing is normal to causal conversation. CN IX, X: Palate elevates  symmetrically. Phonation is normal. CN XI: Head turning and shoulder shrug are intact CN XII: Tongue is midline with normal movements and no atrophy.  MOTOR: There is no pronator drift of out-stretched arms. Muscle bulk and tone are normal. Muscle strength is normal.  REFLEXES: Reflexes are 1 and symmetric at the biceps, triceps, knees, and ankles. Plantar responses are flexor.  SENSORY: Intact to light touch, pinprick, positional sensation and vibratory sensation are intact in fingers and toes.  COORDINATION: Rapid alternating movements and fine finger movements are intact. There is no dysmetria on finger-to-nose and heel-knee-shin.    GAIT/STANCE: Posture is normal. Gait is steady with normal steps, base, arm swing, and turning.      DIAGNOSTIC DATA (LABS, IMAGING, TESTING) - I reviewed patient records, labs, notes, testing and imaging myself where available.   ASSESSMENT AND PLAN  PHILENA OBEY is a 73 y.o. 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 Ph.D.  [facility] [address] [place], [place] [address] Ph: [phone] Fax: [phone]  CC: Wandra Feinstein, MD

## 2019-04-28 NOTE — Telephone Encounter (Signed)
cone UMR/Medicare order sent to [facility]. No auth they will reach out to the patient to schedule.

## 2019-05-03 LAB — HOMOCYSTEINE: Homocysteine: 13 umol/L (ref 0.0–19.2)

## 2019-05-03 LAB — METHYLMALONIC ACID, SERUM: Methylmalonic Acid: 227 nmol/L (ref 0–378)

## 2019-05-03 LAB — VITAMIN B12: Vitamin B-12: 443 pg/mL (ref 232–1245)

## 2019-05-04 DIAGNOSIS — M2550 Pain in unspecified joint: Secondary | ICD-10-CM | POA: Diagnosis not present

## 2019-05-04 DIAGNOSIS — F0390 Unspecified dementia without behavioral disturbance: Secondary | ICD-10-CM | POA: Diagnosis not present

## 2019-05-04 DIAGNOSIS — R768 Other specified abnormal immunological findings in serum: Secondary | ICD-10-CM | POA: Diagnosis not present

## 2019-05-04 DIAGNOSIS — M1990 Unspecified osteoarthritis, unspecified site: Secondary | ICD-10-CM | POA: Diagnosis not present

## 2019-05-04 DIAGNOSIS — M1611 Unilateral primary osteoarthritis, right hip: Secondary | ICD-10-CM | POA: Diagnosis not present

## 2019-05-04 DIAGNOSIS — M25551 Pain in right hip: Secondary | ICD-10-CM | POA: Diagnosis not present

## 2019-05-04 DIAGNOSIS — M0579 Rheumatoid arthritis with rheumatoid factor of multiple sites without organ or systems involvement: Secondary | ICD-10-CM | POA: Diagnosis not present

## 2019-05-12 NOTE — Progress Notes (Deleted)
Office Visit Note  Patient: Wendy Mccann             Date of Birth: 08-24-1945           MRN: [id number]             PCP: Maryruth Hancock, MD Referring: Maryruth Hancock, MD Visit Date: 05/26/2019 Occupation: @GUAROCC @  Subjective:  No chief complaint on file.  Plaquenil 200 mg 1 tablet Monday through Friday only started in October 2020.  Most recent CBC/CMP within normal limits except for low hemoglobin and calcium on 02/09/2019.  Due for CBC/CMP today and will monitor every 3 months.  History of Present Illness: Wendy Mccann is a 73 y.o. female ***   Activities of Daily Living:  Patient reports morning stiffness for *** {minute/hour:19697}.   Patient {ACTIONS;DENIES/REPORTS:21021675::"Denies"} nocturnal pain.  Difficulty dressing/grooming: {ACTIONS;DENIES/REPORTS:21021675::"Denies"} Difficulty climbing stairs: {ACTIONS;DENIES/REPORTS:21021675::"Denies"} Difficulty getting out of chair: {ACTIONS;DENIES/REPORTS:21021675::"Denies"} Difficulty using hands for taps, buttons, cutlery, and/or writing: {ACTIONS;DENIES/REPORTS:21021675::"Denies"}  No Rheumatology ROS completed.   PMFS History:  Patient Active Problem List   Diagnosis Date Noted  . Vitamin B12 deficiency 04/28/2019  . Joint swelling 04/08/2019  . Elevated rheumatoid factor 02/24/2019  . B12 deficiency 02/24/2019  . Dementia without behavioral disturbance ([place]) 02/07/2019  . 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 Acute metabolic encephalopathy 64/40/3474  . Normocytic anemia 01/23/2019  . Hypokalemia 01/22/2019  . Volume depletion 01/22/2019  . Hyperbilirubinemia 01/22/2019  . Generalized weakness 08/12/2016  . Orthostatic hypotension 08/12/2016  . Abdominal pain 08/12/2016  . Cough 08/12/2016  . Dehydration, mild 08/12/2016    Past Medical History:  Diagnosis Date  . Alzheimer's dementia ([place])   . Anemia   . Medical history non-contributory   . Rotator cuff (capsule) sprain     Family History  Problem  Relation Age of Onset  . Gout Father   . Stroke Father   . Heart attack Father   . Dementia Mother    Past Surgical History:  Procedure Laterality Date  . ankles Bilateral    Social History   Social History Narrative   Lives alone.   Right-handed.   No daily caffeine use.   Immunization History  Administered Date(s) Administered  . Fluad Quad(high Dose 65+) 04/23/2019  . Influenza-Unspecified 04/03/2018  . Pneumococcal Polysaccharide-23 07/28/2014  . Tdap 07/16/1992     Objective: Vital Signs: There were no vitals taken for this visit.   Physical Exam   Musculoskeletal Exam: ***  CDAI Exam: CDAI Score: - Patient Global: -; Provider Global: - Swollen: -; Tender: - Joint Exam   No joint exam has been documented for this visit   There is currently no information documented on the homunculus. Go to the Rheumatology activity and complete the homunculus joint exam.  Investigation: No additional findings.  Imaging: No results found.  Recent Labs: Lab Results  Component Value Date   WBC 4.9 02/09/2019   HGB 9.6 (L) 02/09/2019   PLT 325 02/09/2019   NA 139 02/09/2019   K 3.2 (L) 02/09/2019   CL 106 02/09/2019   CO2 25 02/09/2019   GLUCOSE 81 02/09/2019   BUN 6 (L) 02/09/2019   CREATININE 0.56 02/09/2019   BILITOT 1.1 02/06/2019   ALKPHOS 55 02/06/2019   AST 14 (L) 02/06/2019   ALT 9 02/06/2019   PROT 6.5 02/06/2019   ALBUMIN 2.8 (L) 02/06/2019   CALCIUM 8.8 (L) 02/09/2019   GFRAA >60 02/09/2019    Speciality Comments: No specialty comments  available.  Procedures:  No procedures performed Allergies: Patient has no known allergies.   Assessment / Plan:     Visit Diagnoses: No diagnosis found.  Orders: No orders of the defined types were placed in this encounter.  No orders of the defined types were placed in this encounter.   Face-to-face time spent with patient was *** minutes. Greater than 50% of time was spent in counseling and  coordination of care.  Follow-Up Instructions: No follow-ups on file.   Ellen Henri, CMA  Note - This record has been created using Animal nutritionist.  Chart creation errors have been sought, but may not always  have been located. Such creation errors do not reflect on  the standard of medical care.

## 2019-05-26 ENCOUNTER — Ambulatory Visit: Payer: 59 | Admitting: Physician Assistant

## 2019-05-28 DIAGNOSIS — Z23 Encounter for immunization: Secondary | ICD-10-CM | POA: Insufficient documentation

## 2019-05-28 NOTE — Patient Instructions (Addendum)

## 2019-06-25 NOTE — Progress Notes (Signed)
Dr Corum to sign 

## 2019-06-25 NOTE — Progress Notes (Deleted)
Nurse Visit for Flushot

## 2019-07-08 LAB — COMPREHENSIVE METABOLIC PANEL
ALT: 19 (ref 3–30)
AST: 22
Albumin/Globulin Ratio: 1.2
Albumin: 3.8
Alkaline Phosphatase: 96
BUN/Creatinine Ratio: 18
BUN: 17 (ref 4–21)
Calcium: 9.7
Carbon Dioxide, Total: 25
Chloride: 101
Creat: 0.95
EGFR (African American): 69
EGFR (Non-African Amer.): 60
Globulin, Total: 3.3
Glucose: 76
Total Bilirubin: 0.4
Total Protein: 7.1 (ref 6.4–8.2)

## 2019-07-08 LAB — CBC
Basophil %: 0.6
Eosinophils, %: 4.5
HCT: 38 (ref 29–41)
Hemoglobin: 12.3
Lymphocytes: 24.6
MCH: 30.3
MCHC: 32
MCV: 94.6 (ref 76–111)
MONO%: 14 %
MPV: 11.2 fL (ref 0–99.8)
Neutrophils: 56.7
RBC: 4.06 (ref 3.87–5.11)
RDW: 14.9
WBC: 6.9
platelet count: 149

## 2019-07-28 NOTE — Progress Notes (Deleted)
Established Patient Office Visit  Subjective:  Patient ID: Wendy Mccann, female    DOB: [birth year]  Age: 74 y.o. MRN: [id number]  CC:  Chief Complaint  Patient presents with  . Flu Vaccine    HPI Wendy Mccann presents for ***  Past Medical History:  Diagnosis Date  . Alzheimer's dementia (HCC)   . Anemia   . Medical history non-contributory   . Rotator cuff (capsule) sprain     Past Surgical History:  Procedure Laterality Date  . ankles Bilateral     Family History  Problem Relation Age of Onset  . Gout Father   . Stroke Father   . Heart attack Father   . Dementia Mother     Social History   Socioeconomic History  . Marital status: Single    Spouse name: Not on file  . Number of children: 0  . Years of education: 57  . Highest education level: High school graduate  Occupational History  . Occupation: Retired  Tobacco Use  . Smoking status: Never Smoker  . Smokeless tobacco: Never Used  Substance and Sexual Activity  . Alcohol use: No  . Drug use: No  . Sexual activity: Not on file  Other Topics Concern  . Not on file  Social History Narrative   Lives alone.   Right-handed.   No daily caffeine use.   Social Determinants of Health   Financial Resource Strain:   . Difficulty of Paying Living Expenses: Not on file  Food Insecurity:   . Worried About Programme researcher, broadcasting/film/video in the Last Year: Not on file  . Ran Out of Food in the Last Year: Not on file  Transportation Needs:   . Lack of Transportation (Medical): Not on file  . Lack of Transportation (Non-Medical): Not on file  Physical Activity:   . Days of Exercise per Week: Not on file  . Minutes of Exercise per Session: Not on file  Stress:   . Feeling of Stress : Not on file  Social Connections:   . Frequency of Communication with Friends and Family: Not on file  . Frequency of Social Gatherings with Friends and Family: Not on file  . Attends Religious Services: Not on file  . Active  Member of Clubs or Organizations: Not on file  . Attends Banker Meetings: Not on file  . Marital Status: Not on file  Intimate Partner Violence:   . Fear of Current or Ex-Partner: Not on file  . Emotionally Abused: Not on file  . Physically Abused: Not on file  . Sexually Abused: Not on file    Outpatient Medications Prior to Visit  Medication Sig Dispense Refill  . acetaminophen (TYLENOL) 325 MG tablet Take 2 tablets (650 mg total) by mouth every 6 (six) hours as needed for mild pain, fever or headache (or Fever >/= 101). 12 tablet 0  . ferrous sulfate 325 (65 FE) MG tablet Take 1 tablet (325 mg total) by mouth daily with breakfast. 30 tablet 0  . hydroxychloroquine (PLAQUENIL) 200 MG tablet Take 1 tablet 200 mg BID Monday-Friday 120 tablet 0  . Polyethylene Glycol 3350 (MIRALAX PO) Take by mouth as needed.    [name] QUEtiapine (SEROQUEL) 25 MG tablet Take 1 tablet (25 mg total) by mouth at bedtime. 30 tablet 0  . senna-docusate (SENOKOT-S) 8.6-50 MG tablet Take 2 tablets by mouth at bedtime. 60 tablet 1  . donepezil (ARICEPT) 5 MG tablet Take 1 tablet (5  mg total) by mouth at bedtime. 30 tablet 0   Facility-Administered Medications Prior to Visit  Medication Dose Route Frequency Provider Last Rate Last Admin  . cyanocobalamin ((VITAMIN B-12)) injection 1,000 mcg  1,000 mcg Intramuscular Q30 days Maryruth Hancock, MD   1,000 mcg at 02/24/19 0530    No Known Allergies  ROS Review of Systems    Objective:    Physical Exam  BP 129/74 (BP Location: Left Arm, Patient Position: Sitting, Cuff Size: Normal)   Ht 5\' 1"  (1.549 m)   Wt 131 lb (59.4 kg)   BMI 24.75 kg/m  Wt Readings from Last 3 Encounters:  04/28/19 129 lb 8 oz (58.7 kg)  04/28/19 131 lb (59.4 kg)  04/23/19 129 lb (58.5 kg)     Health Maintenance Due  Topic Date Due  . Hepatitis C Screening  1945/10/16  . MAMMOGRAM  01/23/1997  . COLONOSCOPY  01/23/1997  . TETANUS/TDAP  07/16/2002  . DEXA SCAN   01/24/2012  . PNA vac Low Risk Adult (2 of 2 - PCV13) 07/29/2015    There are no preventive care reminders to display for this patient.  Lab Results  Component Value Date   TSH 2.344 01/25/2019   Lab Results  Component Value Date   WBC 6.9 07/06/2019   HGB 9.6 (L) 02/09/2019   HCT 38 07/06/2019   MCV 94.6 07/06/2019   PLT 325 02/09/2019   Lab Results  Component Value Date   NA 139 02/09/2019   K 3.2 (L) 02/09/2019   CO2 25 07/06/2019   GLUCOSE 81 02/09/2019   BUN 17 07/06/2019   CREATININE 0.95 07/06/2019   BILITOT 0.4 07/06/2019   ALKPHOS 96 07/06/2019   AST 22 07/06/2019   ALT 19 07/06/2019   PROT 7.1 07/06/2019   ALBUMIN 3.8 07/06/2019   CALCIUM 9.7 07/06/2019   ANIONGAP 8 02/09/2019   No results found for: CHOL No results found for: HDL No results found for: LDLCALC No results found for: TRIG No results found for: CHOLHDL No results found for: HGBA1C    Assessment & Plan:   Problem List Items Addressed This Visit    Need for immunization against influenza - Primary   Relevant Orders   Flu Vaccine QUAD High Dose(Fluad) (Completed)    Other Visit Diagnoses    Memory loss          No orders of the defined types were placed in this encounter.   Follow-up: No follow-ups on file.    Rodney Langton, [place]

## 2019-07-28 NOTE — Progress Notes (Deleted)
  Wendy Mccann  [address] [place] [address] July 28, 2019     Dear [name],  In addition to helping you feel better when you are sick, we are interested in preventing illness and injury. To help maintain your good health, please make sure your immunizations are up to date. Our records indicate that you are  due for the following:  Adult vaccines due  Topic Date Due   TETANUS/TDAP  07/16/2002   The following information is from the Centers for Disease Control and Prevention about why vaccination is important:  Immunizations are not just for children. Protection from some childhood vaccines can wear off over time. You may also be at risk for vaccine-preventable disease due to your job, lifestyle, travel, age or health conditions.  Flu - All Adults need a flu vaccine every year. Tetanus - Every adult should get the Tdap vaccine once if they did not receive it as an adolescent to protect against pertussis (whooping cough), and then a Td (tetanus, diphtheria) booster shot every 10 years. Adults 60 years or older : Zoster vaccine, which protects against shingles. Adults 65 or older: Pneumococcal vaccine, which protects against pneumococcal diseases that cause infections in the lungs (adults with specific health conditions should receive this vaccine earlier). CDC link   https://stone-lopez.com/  Please contact your provider's office to schedule a vaccination appointment. Please disregard this notice if you have already received the listed vaccines. We will update your records at your next office visit or you can send a message via the patient portal at [email or web address]. Thank you for partnering with [place] to improve your wellness.   Sincerely,  [facility]

## 2019-07-28 NOTE — Progress Notes (Deleted)
Influenza vaccine allergy testing was performed on Collene Schlichter using standard technique. There were no immediate complications.  Interpretation:  ***  Test Results    Test Administration Information

## 2019-07-28 NOTE — Progress Notes (Deleted)
Dr Judee Clara to sign

## 2019-10-07 ENCOUNTER — Ambulatory Visit: Payer: Medicare Other | Admitting: Neurology

## 2019-10-07 ENCOUNTER — Telehealth: Payer: Self-pay | Admitting: Neurology

## 2019-10-07 ENCOUNTER — Other Ambulatory Visit: Payer: Self-pay

## 2019-10-07 ENCOUNTER — Encounter: Payer: Self-pay | Admitting: Neurology

## 2019-10-07 VITALS — BP 122/70 | HR 68 | Temp 97.6°F | Ht 61.0 in | Wt 137.2 lb

## 2019-10-07 DIAGNOSIS — R413 Other amnesia: Secondary | ICD-10-CM

## 2019-10-07 DIAGNOSIS — F0390 Unspecified dementia without behavioral disturbance: Secondary | ICD-10-CM

## 2019-10-07 NOTE — Telephone Encounter (Signed)
Can we still get the MRI of the brain that Dr. Terrace Arabia ordered in October?

## 2019-10-07 NOTE — Progress Notes (Signed)
PATIENT: ELIESE KERWOOD DOB: 1946-02-10  REASON FOR VISIT: follow up HISTORY FROM: patient  HISTORY OF PRESENT ILLNESS: Today 10/07/19  HISTORY  JANEAN EISCHEN is a 74 year old female, seen in request by her primary care physician Dr. Dorette Grate for evaluation of memory loss, she is accompanied by her sister Clerance Lav at today's visit on April 28, 2019.  I have reviewed and summarized the referring note from the referring physician.  She has past medical history of anemia, she has past medical history of autoimmune disease, polyarthralgia, is under the care of rheumatologist Dr. Corliss Skains  She was hospitalized in July 2020 for right wrist swelling, hallucinations, prior to that, she worked at the hospital nutritional service, still work part-time job.   she has never driven in her life, graduated from high school. She lives alone since her husband passed away no children, there was strong family history of dementia, mother, parents both suffered dementia.  She is 1 of 17 children of her parents.  Currently her brother and her sister are involved in her care.  She sleeps well, no hallucinations, has good appetite.  She started to have memory loss around 2019, tends to repeat herself become more obvious since most recent hospital admission, Mini-Mental Status Examination 19 out of 30,  Laboratory evaluation showed positive rheumatoid factor, ANA 1-320, nucleolar, 1:1280 nuclear dot., sed rate 90, cyclic citrul peptide antibody IgG  RPR was negative, TSH was normal 2.3, B12 was 189, ferritin 214, iron was only 10, saturation rate was 4  I personally reviewed CT head without contrast, supratentorium small vessel disease  Update October 07, 2019 SS: MRI of the brain was ordered, but has yet to be completed. B12, homocystine, MMA were normal.  She is here today for follow-up accompanied by her brother, Clide Cliff.  She lives alone, but her brother and sister check on her daily.  Her  brother comes after work, stays till bedtime.  She says she sleeps well, has a good appetite.  Her family manages her medications, calls her to take them.  She may misplace things around the house.  She does not drive.  She does her own ADLs and housework.  She remains on Aricept and Namenda, seems to be tolerating well.  She denies any falls.  Is no longer working.  REVIEW OF SYSTEMS: Out of a complete 14 system review of symptoms, the patient complains only of the following symptoms, and all other reviewed systems are negative.  Memory loss  ALLERGIES: No Known Allergies  HOME MEDICATIONS: Outpatient Medications Prior to Visit  Medication Sig Dispense Refill  . acetaminophen (TYLENOL) 325 MG tablet Take 2 tablets (650 mg total) by mouth every 6 (six) hours as needed for mild pain, fever or headache (or Fever >/= 101). 12 tablet 0  . donepezil (ARICEPT) 10 MG tablet Take 1 tablet (10 mg total) by mouth at bedtime. 90 tablet 4  . ferrous sulfate 325 (65 FE) MG tablet Take 1 tablet (325 mg total) by mouth daily with breakfast. 30 tablet 0  . hydroxychloroquine (PLAQUENIL) 200 MG tablet Take 1 tablet 200 mg BID Monday-Friday 120 tablet 0  . memantine (NAMENDA) 10 MG tablet Take 1 tablet (10 mg total) by mouth 2 (two) times daily. 180 tablet 4  . UNABLE TO FIND Med Name: OTC stool softners as needed. "exlax"    . Polyethylene Glycol 3350 (MIRALAX PO) Take by mouth as needed.    [name] QUEtiapine (SEROQUEL) 25 MG tablet Take  1 tablet (25 mg total) by mouth at bedtime. 30 tablet 0  . senna-docusate (SENOKOT-S) 8.6-50 MG tablet Take 2 tablets by mouth at bedtime. 60 tablet 1   Facility-Administered Medications Prior to Visit  Medication Dose Route Frequency Provider Last Rate Last Admin  . cyanocobalamin ((VITAMIN B-12)) injection 1,000 mcg  1,000 mcg Intramuscular Q30 days Wandra Feinstein, MD   1,000 mcg at 02/24/19 0530    PAST MEDICAL HISTORY: Past Medical History:  Diagnosis Date  . Alzheimer's  dementia (HCC)   . Anemia   . Medical history non-contributory   . Rotator cuff (capsule) sprain     PAST SURGICAL HISTORY: Past Surgical History:  Procedure Laterality Date  . ankles Bilateral     FAMILY HISTORY: Family History  Problem Relation Age of Onset  . Gout Father   . Stroke Father   . Heart attack Father   . Dementia Mother     SOCIAL HISTORY: Social History   Socioeconomic History  . Marital status: Single    Spouse name: Not on file  . Number of children: 0  . Years of education: 96  . Highest education level: High school graduate  Occupational History  . Occupation: Retired  Tobacco Use  . Smoking status: Never Smoker  . Smokeless tobacco: Never Used  Substance and Sexual Activity  . Alcohol use: No  . Drug use: No  . Sexual activity: Not on file  Other Topics Concern  . Not on file  Social History Narrative   Lives alone.   Right-handed.   No daily caffeine use.   Social Determinants of Health   Financial Resource Strain:   . Difficulty of Paying Living Expenses:   Food Insecurity:   . Worried About Programme researcher, broadcasting/film/video in the Last Year:   . Barista in the Last Year:   Transportation Needs:   . Freight forwarder (Medical):   [name] Lack of Transportation (Non-Medical):   Physical Activity:   . Days of Exercise per Week:   . Minutes of Exercise per Session:   Stress:   . Feeling of Stress :   Social Connections:   . Frequency of Communication with Friends and Family:   . Frequency of Social Gatherings with Friends and Family:   . Attends Religious Services:   . Active Member of Clubs or Organizations:   . Attends Banker Meetings:   [name] Marital Status:   Intimate Partner Violence:   . Fear of Current or Ex-Partner:   . Emotionally Abused:   [name] Physically Abused:   . Sexually Abused:    PHYSICAL EXAM  Vitals:   10/07/19 1531  BP: 122/70  Pulse: 68  Temp: 97.6 F (36.4 C)  Weight: 137 lb 3.2 oz (62.2 kg)    Height: 5\' 1"  (1.549 m)   Body mass index is 25.92 kg/m.  Generalized: Well developed, in no acute distress  MMSE - Mini Mental State Exam 10/07/2019 04/28/2019 02/24/2019  Not completed: - - Unable to complete  Orientation to time 3 3 0  Orientation to Place 3 4 0  Registration 3 3 0  Attention/ Calculation 0 0 0  Recall 0 0 3  Language- name 2 objects 2 2 2   Language- repeat 1 1 1   Language- follow 3 step command 3 3 0  Language- read & follow direction 1 1 1   Write a sentence 0 1 0  Copy design 0 1 1  Total score  16 19 8     Neurological examination  Mentation: Alert oriented to time, place, history taking. Follows all commands speech and language fluent, very pleasant, engaged and cooperative Cranial nerve II-XII: Pupils were equal round reactive to light. Extraocular movements were full, visual field were full on confrontational test. Facial sensation and strength were normal.  Head turning and shoulder shrug  were normal and symmetric. Motor: The motor testing reveals 5 over 5 strength of all 4 extremities. Good symmetric motor tone is noted throughout.  Sensory: Sensory testing is intact to soft touch on all 4 extremities. No evidence of extinction is noted.  Coordination: Cerebellar testing reveals good finger-nose-finger and heel-to-shin bilaterally.  Gait and station: Gait is normal, no assistive device Reflexes: Deep tendon reflexes are symmetric but depressed bilaterally  DIAGNOSTIC DATA (LABS, IMAGING, TESTING) - I reviewed patient records, labs, notes, testing and imaging myself where available.  Lab Results  Component Value Date   WBC 6.9 07/06/2019   HGB 9.6 (L) 02/09/2019   HCT 38 07/06/2019   MCV 94.6 07/06/2019   PLT 325 02/09/2019      Component Value Date/Time   NA 139 02/09/2019 0620   K 3.2 (L) 02/09/2019 0620   CL 101 07/06/2019 0000   CO2 25 07/06/2019 0000   GLUCOSE 81 02/09/2019 0620   BUN 17 07/06/2019 0000   CREATININE 0.95 07/06/2019 0000    CALCIUM 9.7 07/06/2019 0000   PROT 7.1 07/06/2019 0000   ALBUMIN 3.8 07/06/2019 0000   AST 22 07/06/2019 0000   ALT 19 07/06/2019 0000   ALKPHOS 96 07/06/2019 0000   BILITOT 0.4 07/06/2019 0000   GFRNONAA 60 07/06/2019 0000   GFRAA 69 07/06/2019 0000   No results found for: CHOL, HDL, LDLCALC, LDLDIRECT, TRIG, CHOLHDL No results found for: HGBA1C Lab Results  Component Value Date   VITAMINB12 443 04/28/2019   Lab Results  Component Value Date   TSH 2.344 01/25/2019      ASSESSMENT AND PLAN 74 y.o. year old female  has a past medical history of Alzheimer's dementia ([place]), Anemia, Medical history non-contributory, and Rotator cuff (capsule) sprain. here with:  1. Dementia -Most likely central nervous system degenerative process -MMSE was 16/30 today, slight decline prior 19/30 -MRI of the brain was ordered, but has yet to be completed, will try to get this -Continue Namenda 10 mg twice a day, Aricept 10 mg at bedtime -Follow-up in 6 months or sooner if needed  2. Vitamin B12 deficiency -B12, MMA, homocystine are normal -Continue follow-up with PCP  I spent 20 minutes of face-to-face and non-face-to-face time with patient.  This included previsit chart review, lab review, study review, order entry, electronic health record documentation, patient education.  Butler Denmark, AGNP-C, DNP 10/07/2019, 3:56 PM [facility] [address] [place], [place] [address] [phone]

## 2019-10-07 NOTE — Telephone Encounter (Signed)
Can you put a new order in.

## 2019-10-07 NOTE — Patient Instructions (Signed)
It was nice to meet you today! Continue the Aricept and Namenda Will try to get MRI  See primary doctor for physical

## 2019-10-07 NOTE — Telephone Encounter (Signed)
New mri of the brain is ordered.

## 2019-10-07 NOTE — Addendum Note (Signed)
Addended by: Glean Salvo on: 10/07/2019 04:34 PM   Modules accepted: Orders

## 2019-10-08 NOTE — Progress Notes (Signed)
I have reviewed and agreed above plan. 

## 2019-10-08 NOTE — Telephone Encounter (Signed)
Noted, thank you!  [facility] Medicare order sent to GI. No auth they will reach out to the patient to schedule.

## 2019-10-20 ENCOUNTER — Ambulatory Visit (INDEPENDENT_AMBULATORY_CARE_PROVIDER_SITE_OTHER): Payer: Medicare Other | Admitting: Family Medicine

## 2019-10-20 ENCOUNTER — Other Ambulatory Visit: Payer: Self-pay

## 2019-10-20 ENCOUNTER — Encounter: Payer: Self-pay | Admitting: Family Medicine

## 2019-10-20 VITALS — BP 121/68 | HR 71 | Temp 97.9°F | Ht 61.0 in | Wt 135.6 lb

## 2019-10-20 DIAGNOSIS — Z0000 Encounter for general adult medical examination without abnormal findings: Secondary | ICD-10-CM | POA: Diagnosis not present

## 2019-10-20 DIAGNOSIS — Z5181 Encounter for therapeutic drug level monitoring: Secondary | ICD-10-CM | POA: Diagnosis not present

## 2019-10-20 NOTE — Progress Notes (Signed)
Subjective:    Wendy Mccann is a 74 y.o. female who presents for Medicare Annual/Subsequent preventive examination. Preventive Screening-Counseling & Management Tobacco Social History   Tobacco Use  Smoking Status Never Smoker  Smokeless Tobacco Never Used    Problems Prior to Visit 1. Vit B12 deficiency, Dementia-taking aricept/namenda 2. Arthritis-plaquenil Current Problems (verified) Patient Active Problem List   Diagnosis Date Noted  . Need for immunization against influenza 05/28/2019  . Vitamin B12 deficiency 04/28/2019  . Joint swelling 04/08/2019  . Elevated rheumatoid factor 02/24/2019  . B12 deficiency 02/24/2019  . Dementia without behavioral disturbance ([place]) 02/07/2019  . 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 Acute metabolic encephalopathy 28/31/5176  . Normocytic anemia 01/23/2019  . Hypokalemia 01/22/2019  . Volume depletion 01/22/2019  . Hyperbilirubinemia 01/22/2019  . Generalized weakness 08/12/2016  . Orthostatic hypotension 08/12/2016  . Abdominal pain 08/12/2016  . Cough 08/12/2016  . Dehydration, mild 08/12/2016   Medications Prior to Visit Current Outpatient Medications on File Prior to Visit  Medication Sig Dispense Refill  . acetaminophen (TYLENOL) 325 MG tablet Take 2 tablets (650 mg total) by mouth every 6 (six) hours as needed for mild pain, fever or headache (or Fever >/= 101). 12 tablet 0  . donepezil (ARICEPT) 10 MG tablet Take 1 tablet (10 mg total) by mouth at bedtime. 90 tablet 4  . ferrous sulfate 325 (65 FE) MG tablet Take 1 tablet (325 mg total) by mouth daily with breakfast. 30 tablet 0  . hydroxychloroquine (PLAQUENIL) 200 MG tablet Take 1 tablet 200 mg BID Monday-Friday 120 tablet 0  . memantine (NAMENDA) 10 MG tablet Take 1 tablet (10 mg total) by mouth 2 (two) times daily. 180 tablet 4  . UNABLE TO FIND Med Name: OTC stool softners as needed. "exlax"     Current Facility-Administered Medications on File Prior to Visit  Medication  Dose Route Frequency Provider Last Rate Last Admin  . cyanocobalamin ((VITAMIN B-12)) injection 1,000 mcg  1,000 mcg Intramuscular Q30 days Maryruth Hancock, MD   1,000 mcg at 02/24/19 0530  Current Medications (verified) Current Outpatient Medications  Medication Sig Dispense Refill  . acetaminophen (TYLENOL) 325 MG tablet Take 2 tablets (650 mg total) by mouth every 6 (six) hours as needed for mild pain, fever or headache (or Fever >/= 101). 12 tablet 0  . donepezil (ARICEPT) 10 MG tablet Take 1 tablet (10 mg total) by mouth at bedtime. 90 tablet 4  . ferrous sulfate 325 (65 FE) MG tablet Take 1 tablet (325 mg total) by mouth daily with breakfast. 30 tablet 0  . hydroxychloroquine (PLAQUENIL) 200 MG tablet Take 1 tablet 200 mg BID Monday-Friday 120 tablet 0  . memantine (NAMENDA) 10 MG tablet Take 1 tablet (10 mg total) by mouth 2 (two) times daily. 180 tablet 4  . UNABLE TO FIND Med Name: OTC stool softners as needed. "exlax"     Current Facility-Administered Medications  Medication Dose Route Frequency Provider Last Rate Last Admin  . cyanocobalamin ((VITAMIN B-12)) injection 1,000 mcg  1,000 mcg Intramuscular Q30 days Maryruth Hancock, MD   1,000 mcg at 02/24/19 0530   Allergies (verified) Patient has no known allergies.  PAST HISTORY Dementia/B12 Def Family History Family History  Problem Relation Age of Onset  . Gout Father   . Stroke Father   . Heart attack Father   . Dementia Mother     Social History Social History   Tobacco Use  . Smoking status: Never Smoker  .  Smokeless tobacco: Never Used  Substance Use Topics  . Alcohol use: No    Are there smokers in your home (other than you)? No  Risk Factors Current exercise habits: working -Merchandiser, retail Dietary issues discussed: family fixes meals Cardiac risk factors: no Depression Screen  Over the past two weeks, have you felt down, depressed or hopeless? no  Over the past two weeks, have you felt little interest or  pleasure in doing things? no  Have you lost interest or pleasure in daily life? No   Do you often feel hopeless? No   Do you cry easily over simple problems? no  Activities of Daily Living In your present state of health, do you have any difficulty performing the following activities?:  Driving? no Managing money?  Ricky  Feeding yourself? yes Getting from bed to chair? Normal transport Climbing a flight of stairs? yes Preparing food and eating?: eating with no difficulty, food prep by family Bathing or showering? No assistance needed Getting dressed: no assistance needed Getting to the toilet? No difficulty Using the toilet: no difficulty Moving around from place to place:no difficulty In the past year have you fallen or had a near fall?no falls   Are you sexually active? no  Do you have more than one partner?  no  Hearing Difficulties:  Do you often ask people to speak up or repeat themselves?no  No ringing in the ears             Do you have difficulty understanding soft or whispered voices?no  Do you feel that you have a problem with memory? yes  Do you often misplace items? yes  Do you feel safe at home?  yes  Cognitive Testing  Alert- Yes Normal Appearance -yes  Oriented to person-Yes   Advanced Directives have been discussed with the patient?yes-no paperwork signed 1. Dementia-saw neurology 3/21 note -Most likely central nervous system degenerative process -MMSE was 16/30 today, slight decline prior 19/30 -MRI of the brain was ordered, but has yet to be completed, will try to get this -Continue Namenda 10 mg twice a day, Aricept 10 mg at bedtime -Follow-up in 6 months or sooner if needed List the Names of Other Physician/Practitioners you currently use: 1.  Margie Ege NP-neurology 2.  Shaili Deveshwar-rheumatology Indicate any recent Medical Services you may have received from other than [facility] providers in the past year (date may be approximate). 10/07/19 [facility] Immunization History  Administered Date(s) Administered  . Fluad Quad(high Dose 65+) 04/23/2019  . Influenza-Unspecified 04/03/2018  . Pneumococcal Polysaccharide-23 07/28/2014  . Tdap 07/16/1992   Screening Tests Health Maintenance  Topic Date Due  . Hepatitis C Screening  Never done  . MAMMOGRAM  Never done  . COLONOSCOPY  Never done  . TETANUS/TDAP  07/16/2002  . DEXA SCAN  Never done  . PNA vac Low Risk Adult (2 of 2 - PCV13) 07/29/2015  . INFLUENZA VACCINE  02/14/2020   All answers were reviewed with the patient and necessary referrals were made: eye exam  Shaneisha Burkel Mat Carne, MD   10/20/2019  History reviewed yes-PMH/PSH/medications   Objective:  Body mass index is 25.62 kg/m. BP 121/68 (BP Location: Left Arm, Patient Position: Sitting, Cuff Size: Normal)   Pulse 71   Temp 97.9 F (36.6 C) (Temporal)   Ht 5\' 1"  (1.549 m)   Wt 135 lb 9.6 oz (61.5 kg)   SpO2 98%   BMI 25.62 kg/m    Assessment:  1.  Encounter for medication monitoring Needs eye exam due to plaquenil  - Ambulatory referral to Ophthalmology  2. Encounter for subsequent annual wellness visit (AWV) in Medicare patient D/w pt and son need for living will , health care POA-paperwork given previously-will take to lawyer to sign per son        Plan:     During the course of the visit the patient was educated and counseled about appropriate screening and preventive services including: yes  Patient Instructions was given to the patient.  Medicare Attestation I have personally reviewed: The patient's medical and social history Their use of alcohol, tobacco or illicit drugs Their current medications and supplements The patient's functional ability including ADLs,fall risks, home safety risks, cognitive, and hearing and visual impairment Diet and physical activities Evidence for depression or mood disorders  The patient's weight, height, BMI, and visual acuity have been recorded  in the chart.  I have made referrals, counseling, and provided education to the patient based on review of the above and I have provided the patient with a written personalized care plan for preventive services.    Georga Kaufmann, MD   10/20/2019

## 2019-10-20 NOTE — Patient Instructions (Addendum)
   Eye doctor referral Will call with vaccines needed  If you have lab work done today you will be contacted with your lab results within the next 2 weeks.  If you have not heard from [place] then please contact us. The fastest way to get your results is to register for My Chart.   IF you received an x-ray today, you will receive an invoice from [facility]. Please contact [facility] at [phone] with questions or concerns regarding your invoice.   IF you received labwork today, you will receive an invoice from [place]. Please contact [facility] at [phone] with questions or concerns regarding your invoice.   Our billing staff will not be able to assist you with questions regarding bills from these companies.  You will be contacted with the lab results as soon as they are available. The fastest way to get your results is to activate your My Chart account. Instructions are located on the last page of this paperwork. If you have not heard from [place] regarding the results in 2 weeks, please contact this office.

## 2019-10-22 DIAGNOSIS — Z0000 Encounter for general adult medical examination without abnormal findings: Secondary | ICD-10-CM | POA: Insufficient documentation

## 2019-10-24 ENCOUNTER — Ambulatory Visit
Admission: RE | Admit: 2019-10-24 | Discharge: 2019-10-24 | Disposition: A | Discharge status: Home / Self Care | Payer: Medicare Other | Source: Ambulatory Visit | Attending: Neurology | Admitting: Neurology

## 2019-10-24 ENCOUNTER — Other Ambulatory Visit: Payer: Self-pay

## 2019-10-24 DIAGNOSIS — R413 Other amnesia: Secondary | ICD-10-CM | POA: Diagnosis not present

## 2019-11-02 ENCOUNTER — Telehealth: Payer: Self-pay | Admitting: *Deleted

## 2019-11-02 ENCOUNTER — Encounter: Payer: Self-pay | Admitting: *Deleted

## 2019-11-02 NOTE — Telephone Encounter (Signed)
Pt sister called back in regards to missed call for MRI results

## 2019-11-02 NOTE — Telephone Encounter (Signed)
LMVM for pt x 2 to give MRI results.  I mailed result letter to pt.

## 2019-11-02 NOTE — Telephone Encounter (Signed)
I relayed MRI results to Ms Orson Aloe, to relay to pt.  She verbalized understanding.  There will be a letter as well with results.

## 2019-12-04 DIAGNOSIS — H40013 Open angle with borderline findings, low risk, bilateral: Secondary | ICD-10-CM | POA: Diagnosis not present

## 2020-02-02 ENCOUNTER — Encounter (INDEPENDENT_AMBULATORY_CARE_PROVIDER_SITE_OTHER): Payer: Self-pay | Admitting: Nurse Practitioner

## 2020-02-02 ENCOUNTER — Other Ambulatory Visit: Payer: Self-pay

## 2020-02-02 ENCOUNTER — Encounter (INDEPENDENT_AMBULATORY_CARE_PROVIDER_SITE_OTHER): Payer: Self-pay

## 2020-02-02 ENCOUNTER — Ambulatory Visit (INDEPENDENT_AMBULATORY_CARE_PROVIDER_SITE_OTHER): Payer: Medicare HMO | Admitting: Nurse Practitioner

## 2020-02-02 VITALS — BP 120/60 | HR 61 | Temp 97.7°F | Ht 61.0 in | Wt 139.8 lb

## 2020-02-02 DIAGNOSIS — Z1329 Encounter for screening for other suspected endocrine disorder: Secondary | ICD-10-CM | POA: Diagnosis not present

## 2020-02-02 DIAGNOSIS — D649 Anemia, unspecified: Secondary | ICD-10-CM | POA: Diagnosis not present

## 2020-02-02 DIAGNOSIS — Z13220 Encounter for screening for lipoid disorders: Secondary | ICD-10-CM

## 2020-02-02 DIAGNOSIS — Z1231 Encounter for screening mammogram for malignant neoplasm of breast: Secondary | ICD-10-CM | POA: Diagnosis not present

## 2020-02-02 DIAGNOSIS — E538 Deficiency of other specified B group vitamins: Secondary | ICD-10-CM | POA: Diagnosis not present

## 2020-02-02 DIAGNOSIS — F0390 Unspecified dementia without behavioral disturbance: Secondary | ICD-10-CM | POA: Diagnosis not present

## 2020-02-02 DIAGNOSIS — Z131 Encounter for screening for diabetes mellitus: Secondary | ICD-10-CM

## 2020-02-02 NOTE — Progress Notes (Signed)
Subjective:  Patient ID: GENISE STRACK, female    DOB: [birth year]  Age: 74 y.o. MRN: [id number]  CC:  Chief Complaint  Patient presents with  . Establish Care      HPI  This patient arrives today accompanied by her family member to establish care at this office.  She was being seen previously by Dr. Holly Bodily, however Dr. Holly Bodily has left the area so she is here to establish with a new primary care provider.  She is a 74 year old female with past medical history significant for anemia, rheumatoid arthritis, and dementia.  Per chart review it appears that she may have had both vitamin B12 and iron deficiency anemia.  She continues on an iron supplement but is no longer taking vitamin B12 injections.  She was also on hydroxychloroquine for treatment of rheumatoid arthritis, however her family member tells me she is not taking this medication any longer currently.  She does continue on donepezil and memantine.  Her family tells me that the patient follows up with rheumatology again in September and follows up regularly with her memory care provider.  As far as health maintenance concerned the patient is most likely due for tetanus shot.  She is also due for breast cancer screening, colon cancer screening, hepatitis C screening, and osteoporosis screening.  She has no acute complaints today.   Past Medical History:  Diagnosis Date  . Alzheimer's dementia ([place])   . Anemia   . Medical history non-contributory   . Rotator cuff (capsule) sprain       Family History  Problem Relation Age of Onset  . Gout Father   . Stroke Father   . Heart attack Father   . Dementia Mother     Social History   Social History Narrative   Lives alone.   Right-handed.   No daily caffeine use.   Social History   Tobacco Use  . Smoking status: Never Smoker  . Smokeless tobacco: Never Used  Substance Use Topics  . Alcohol use: No     Current Meds  Medication Sig  . acetaminophen  (TYLENOL) 325 MG tablet Take 2 tablets (650 mg total) by mouth every 6 (six) hours as needed for mild pain, fever or headache (or Fever >/= 101).  [name] donepezil (ARICEPT) 10 MG tablet Take 1 tablet (10 mg total) by mouth at bedtime.  . ferrous sulfate 325 (65 FE) MG tablet Take 1 tablet (325 mg total) by mouth daily with breakfast.  . hydroxychloroquine (PLAQUENIL) 200 MG tablet Take 1 tablet 200 mg BID Monday-Friday  . memantine (NAMENDA) 10 MG tablet Take 1 tablet (10 mg total) by mouth 2 (two) times daily.  [name] UNABLE TO FIND Med Name: OTC stool softners as needed. "exlax"    ROS:  Review of Systems  Constitutional: Negative for fever, malaise/fatigue and weight loss.  Eyes: Positive for blurred vision.  Respiratory: Negative for cough, shortness of breath and wheezing.   Cardiovascular: Negative for chest pain and palpitations.  Gastrointestinal: Negative for abdominal pain and blood in stool.  Musculoskeletal: Negative for joint pain.  Neurological: Positive for headaches. Negative for dizziness.     Objective:   Today's Vitals: BP 120/60 (BP Location: Left Arm, Patient Position: Sitting, Cuff Size: Normal)   Pulse 61   Temp 97.7 F (36.5 C) (Temporal)   Ht 5' 1"  (1.549 m)   Wt 139 lb 12.8 oz (63.4 kg)   SpO2 98%   BMI 26.41 kg/m  Vitals with BMI 02/02/2020 10/20/2019 10/07/2019  Height 5' 1"  5' 1"  5' 1"   Weight 139 lbs 13 oz 135 lbs 10 oz 137 lbs 3 oz  BMI 26.43 16.10 96.04  Systolic 540 981 191  Diastolic 60 68 70  Pulse 61 71 68  Some encounter information is confidential and restricted. Go to Review Flowsheets activity to see all data.     Physical Exam Vitals reviewed.  Constitutional:      General: She is not in acute distress.    Appearance: Normal appearance.  HENT:     Head: Normocephalic and atraumatic.  Cardiovascular:     Rate and Rhythm: Normal rate and regular rhythm.     Pulses: Normal pulses.     Heart sounds: Normal heart sounds.  Pulmonary:      Effort: Pulmonary effort is normal.     Breath sounds: Normal breath sounds.  Skin:    General: Skin is warm and dry.  Neurological:     General: No focal deficit present.     Mental Status: She is alert and oriented to person, place, and time.  Psychiatric:        Mood and Affect: Mood normal.        Behavior: Behavior normal.        Judgment: Judgment normal.          Assessment and Plan   1. Screening for diabetes mellitus   2. Screening for lipid disorders   3. Screening for thyroid disorder   4. Dementia without behavioral disturbance, unspecified dementia type ([place])   5. B12 deficiency   6. Anemia, unspecified type   7. Encounter for screening mammogram for malignant neoplasm of breast      Plan: I will collect blood work today for further evaluation including CBC, CMP, thyroid panel, A1c, iron level, TIBC, vitamin B12 level, and lipid panel.  We did have a discussion regarding tetanus shot administration she would like to hold off on getting this vaccine ministered for now, I did educate her and her family member that if the patient does experience any kind of penetrating wound or burn that she should call the office and we can give her a tetanus shot at that time.  They tell me she understands.  She would be willing to undergo breast cancer screening via mammography so we will get this order.  She would prefer not to undergo colon cancer screening, screening for hepatitis C, or osteoporosis screening at this time.  I will defer to rheumatology regarding restarting patient's hydroxychloroquine, but otherwise she will continue on her donepezil, iron supplement, and memantine.   Tests ordered Orders Placed This Encounter  Procedures  . MM Digital Screening  . CBC  . CMP with eGFR([facility])  . Lipid Panel  . Hemoglobin A1c  . TSH  . Vitamin B12  . Iron and TIBC  . Ferritin  . T3, Free  . T4, Free      No orders of the defined types were placed in this  encounter.   Patient to follow-up in approximately 6 weeks, or sooner as needed.  Ailene Ards, NP

## 2020-02-04 ENCOUNTER — Other Ambulatory Visit: Payer: Self-pay

## 2020-02-04 ENCOUNTER — Other Ambulatory Visit (INDEPENDENT_AMBULATORY_CARE_PROVIDER_SITE_OTHER): Payer: Medicare HMO

## 2020-02-04 DIAGNOSIS — Z13220 Encounter for screening for lipoid disorders: Secondary | ICD-10-CM | POA: Diagnosis not present

## 2020-02-04 DIAGNOSIS — Z1329 Encounter for screening for other suspected endocrine disorder: Secondary | ICD-10-CM | POA: Diagnosis not present

## 2020-02-04 DIAGNOSIS — E538 Deficiency of other specified B group vitamins: Secondary | ICD-10-CM | POA: Diagnosis not present

## 2020-02-04 DIAGNOSIS — F0390 Unspecified dementia without behavioral disturbance: Secondary | ICD-10-CM | POA: Diagnosis not present

## 2020-02-04 DIAGNOSIS — Z131 Encounter for screening for diabetes mellitus: Secondary | ICD-10-CM | POA: Diagnosis not present

## 2020-02-04 DIAGNOSIS — D649 Anemia, unspecified: Secondary | ICD-10-CM | POA: Diagnosis not present

## 2020-02-05 LAB — COMPLETE METABOLIC PANEL WITH GFR
AG Ratio: 1.3 (calc) (ref 1.0–2.5)
ALT: 13 U/L (ref 6–29)
AST: 19 U/L (ref 10–35)
Albumin: 3.9 g/dL (ref 3.6–5.1)
Alkaline phosphatase (APISO): 80 U/L (ref 37–153)
BUN/Creatinine Ratio: 17 (calc) (ref 6–22)
BUN: 18 mg/dL (ref 7–25)
CO2: 29 mmol/L (ref 20–32)
Calcium: 9.6 mg/dL (ref 8.6–10.4)
Chloride: 104 mmol/L (ref 98–110)
Creat: 1.06 mg/dL — ABNORMAL HIGH (ref 0.60–0.93)
GFR, Est African American: 60 mL/min/{1.73_m2} (ref >=60)
GFR, Est Non African American: 52 mL/min/{1.73_m2} — ABNORMAL LOW (ref >=60)
Globulin: 2.9 g/dL (calc) (ref 1.9–3.7)
Glucose, Bld: 82 mg/dL (ref 65–99)
Potassium: 4.1 mmol/L (ref 3.5–5.3)
Sodium: 139 mmol/L (ref 135–146)
Total Bilirubin: 0.8 mg/dL (ref 0.2–1.2)
Total Protein: 6.8 g/dL (ref 6.1–8.1)

## 2020-02-05 LAB — FERRITIN: Ferritin: 202 ng/mL (ref 16–288)

## 2020-02-05 LAB — CBC
HCT: 36.5 % (ref 35.0–45.0)
Hemoglobin: 12.4 g/dL (ref 11.7–15.5)
MCH: 32.9 pg (ref 27.0–33.0)
MCHC: 34 g/dL (ref 32.0–36.0)
MCV: 96.8 fL (ref 80.0–100.0)
MPV: 10.9 fL (ref 7.5–12.5)
Platelets: 246 10*3/uL (ref 140–400)
RBC: 3.77 10*6/uL — ABNORMAL LOW (ref 3.80–5.10)
RDW: 12 % (ref 11.0–15.0)
WBC: 4.6 10*3/uL (ref 3.8–10.8)

## 2020-02-05 LAB — VITAMIN B12: Vitamin B-12: 502 pg/mL (ref 200–1100)

## 2020-02-05 LAB — T3, FREE: T3, Free: 2.7 pg/mL (ref 2.3–4.2)

## 2020-02-05 LAB — T4, FREE: Free T4: 1.1 ng/dL (ref 0.8–1.8)

## 2020-02-05 LAB — TSH: TSH: 2.26 mIU/L (ref 0.40–4.50)

## 2020-02-05 LAB — IRON, TOTAL/TOTAL IRON BINDING CAP: Iron: 80 ug/dL (ref 45–160)

## 2020-02-15 ENCOUNTER — Ambulatory Visit (HOSPITAL_COMMUNITY): Payer: Medicare HMO

## 2020-03-07 DIAGNOSIS — M0579 Rheumatoid arthritis with rheumatoid factor of multiple sites without organ or systems involvement: Secondary | ICD-10-CM | POA: Diagnosis not present

## 2020-03-07 DIAGNOSIS — F0390 Unspecified dementia without behavioral disturbance: Secondary | ICD-10-CM | POA: Diagnosis not present

## 2020-03-07 DIAGNOSIS — R768 Other specified abnormal immunological findings in serum: Secondary | ICD-10-CM | POA: Diagnosis not present

## 2020-03-07 DIAGNOSIS — M1990 Unspecified osteoarthritis, unspecified site: Secondary | ICD-10-CM | POA: Diagnosis not present

## 2020-03-07 DIAGNOSIS — M2550 Pain in unspecified joint: Secondary | ICD-10-CM | POA: Diagnosis not present

## 2020-03-15 ENCOUNTER — Ambulatory Visit (INDEPENDENT_AMBULATORY_CARE_PROVIDER_SITE_OTHER): Payer: Medicare HMO | Admitting: Nurse Practitioner

## 2020-03-24 DIAGNOSIS — H01005 Unspecified blepharitis left lower eyelid: Secondary | ICD-10-CM | POA: Diagnosis not present

## 2020-03-24 DIAGNOSIS — H01001 Unspecified blepharitis right upper eyelid: Secondary | ICD-10-CM | POA: Diagnosis not present

## 2020-03-24 DIAGNOSIS — H01004 Unspecified blepharitis left upper eyelid: Secondary | ICD-10-CM | POA: Diagnosis not present

## 2020-03-24 DIAGNOSIS — H02831 Dermatochalasis of right upper eyelid: Secondary | ICD-10-CM | POA: Diagnosis not present

## 2020-03-24 DIAGNOSIS — H401113 Primary open-angle glaucoma, right eye, severe stage: Secondary | ICD-10-CM | POA: Diagnosis not present

## 2020-03-24 DIAGNOSIS — H01002 Unspecified blepharitis right lower eyelid: Secondary | ICD-10-CM | POA: Diagnosis not present

## 2020-03-24 DIAGNOSIS — H401122 Primary open-angle glaucoma, left eye, moderate stage: Secondary | ICD-10-CM | POA: Diagnosis not present

## 2020-03-24 DIAGNOSIS — H25813 Combined forms of age-related cataract, bilateral: Secondary | ICD-10-CM | POA: Diagnosis not present

## 2020-03-24 DIAGNOSIS — H02834 Dermatochalasis of left upper eyelid: Secondary | ICD-10-CM | POA: Diagnosis not present

## 2020-03-29 ENCOUNTER — Other Ambulatory Visit: Payer: Self-pay

## 2020-03-29 ENCOUNTER — Ambulatory Visit (INDEPENDENT_AMBULATORY_CARE_PROVIDER_SITE_OTHER): Payer: Medicare HMO | Admitting: Nurse Practitioner

## 2020-03-29 ENCOUNTER — Encounter (INDEPENDENT_AMBULATORY_CARE_PROVIDER_SITE_OTHER): Payer: Self-pay | Admitting: Nurse Practitioner

## 2020-03-29 VITALS — BP 120/62 | HR 74 | Temp 97.3°F | Resp 18 | Ht 61.0 in | Wt 142.0 lb

## 2020-03-29 DIAGNOSIS — D649 Anemia, unspecified: Secondary | ICD-10-CM

## 2020-03-29 DIAGNOSIS — N289 Disorder of kidney and ureter, unspecified: Secondary | ICD-10-CM | POA: Diagnosis not present

## 2020-03-29 NOTE — Progress Notes (Signed)
Subjective:  Patient ID: Wendy Mccann, female    DOB: 1946/06/06  Age: 74 y.o. MRN: [id number]  CC:  Chief Complaint  Patient presents with  . Follow-up  . Anemia  . Other    Renal Insufficiency      HPI  This patient arrives today for routine follow-up accompanied by her son.  Last office visit she was initiating care and blood work was taken.  Most blood work appears normal except she did have some mild renal insufficiency with EGFR approximately 60 and creatinine of 1.06.  She does have a history of anemia due to vitamin B12 deficiency and iron deficiency.  Blood work shows normal iron levels, normal vitamin B12, and normal hemoglobin levels.  She has no concerns today.  Past Medical History:  Diagnosis Date  . Alzheimer's dementia ([place])   . Anemia   . Medical history non-contributory   . Rotator cuff (capsule) sprain       Family History  Problem Relation Age of Onset  . Gout Father   . Stroke Father   . Heart attack Father   . Dementia Mother     Social History   Social History Narrative   Lives alone.   Right-handed.   No daily caffeine use.   Social History   Tobacco Use  . Smoking status: Never Smoker  . Smokeless tobacco: Never Used  Substance Use Topics  . Alcohol use: No     Current Meds  Medication Sig  . acetaminophen (TYLENOL) 325 MG tablet Take 2 tablets (650 mg total) by mouth every 6 (six) hours as needed for mild pain, fever or headache (or Fever >/= 101).  [name] donepezil (ARICEPT) 10 MG tablet Take 1 tablet (10 mg total) by mouth at bedtime.  . ferrous sulfate 325 (65 FE) MG tablet Take 1 tablet (325 mg total) by mouth daily with breakfast.  . hydroxychloroquine (PLAQUENIL) 200 MG tablet Take 1 tablet 200 mg BID Monday-Friday  . memantine (NAMENDA) 10 MG tablet Take 1 tablet (10 mg total) by mouth 2 (two) times daily.  [name] UNABLE TO FIND Med Name: OTC stool softners as needed. "exlax"    ROS:  Review of Systems  Constitutional:  Negative.   Respiratory: Negative.   Cardiovascular: Negative.   Gastrointestinal: Negative.   Neurological: Negative.      Objective:   Today's Vitals: BP 120/62 (BP Location: Right Arm, Patient Position: Sitting, Cuff Size: Normal)   Pulse 74   Temp (!) 97.3 F (36.3 C) (Temporal)   Resp 18   Ht 5' 1"  (1.549 m)   Wt 142 lb (64.4 kg)   SpO2 97%   BMI 26.83 kg/m  Vitals with BMI 03/29/2020 02/02/2020 10/20/2019  Height 5' 1"  5' 1"  5' 1"   Weight 142 lbs 139 lbs 13 oz 135 lbs 10 oz  BMI 26.84 34.28 76.81  Systolic 157 262 035  Diastolic 62 60 68  Pulse 74 61 71  Some encounter information is confidential and restricted. Go to Review Flowsheets activity to see all data.     Physical Exam Vitals reviewed.  Constitutional:      General: She is not in acute distress.    Appearance: Normal appearance.  HENT:     Head: Normocephalic and atraumatic.  Neck:     Vascular: No carotid bruit.  Cardiovascular:     Rate and Rhythm: Normal rate and regular rhythm.     Pulses: Normal pulses.  Heart sounds: Normal heart sounds.  Pulmonary:     Effort: Pulmonary effort is normal.     Breath sounds: Normal breath sounds.  Skin:    General: Skin is warm and dry.  Neurological:     General: No focal deficit present.     Mental Status: She is alert and oriented to person, place, and time.  Psychiatric:        Mood and Affect: Mood normal.        Behavior: Behavior normal.        Judgment: Judgment normal.          Assessment and Plan   1. Anemia, unspecified type   2. Renal insufficiency      Plan: 1.  Well controlled using current iron supplement.  Will not reinitiate vitamin B 12 supplement at this time.  We will continue to monitor periodically. 2.  Patient was encouraged to focus on hydration regularly throughout the day, consider rechecking metabolic panel at next office visit for further evaluation.   Tests ordered No orders of the defined types were placed in  this encounter.     No orders of the defined types were placed in this encounter.   Patient to follow-up in 3 to 4 months, or sooner as needed.  Ailene Ards, NP

## 2020-04-12 NOTE — H&P (Signed)
Surgical History & Physical  Patient Name: Wendy Mccann DOB: [birth year]  Surgery: Cataract extraction with intraocular lens implant phacoemulsification; Left Eye  Surgeon: Fabio Pierce MD Surgery Date:  04/25/2020 Pre-Op Date:  04/12/2020  HPI: A 56 Yr. old female patient Pt present for cataract evaluation, referred by Dr. Daphine Deutscher. The patient complains of difficulty when recognizing people at distance and near. Both eyes are affected. The episode is constant. The condition's severity is worsening. The condition is worse at night. The complaint is associated with glares and star bursting lights. Pt taking high risk medication for the past year (Plaquenil). Being monitored by Dr. Daphine Deutscher with annual OCT. Pt c/o "black lines" in central vision, left eye. Pt states lines have been present for months. This is negatively affecting the patient's quality of life. HPI was performed by Fabio Pierce .  Medical History: Glaucoma Cataracts Arthritis Vertigo  Review of Systems Eyes Vision loss All recorded systems are negative except as noted above.  Social   Never smoked   Medication Hydroxychloroquine, Tylenol,   Sx/Procedures  None  Drug Allergies   NKDA  History & Physical: Heent:  Cataract, Left eye NECK: supple without bruits LUNGS: lungs clear to auscultation CV: regular rate and rhythm Abdomen: soft and non-tender  Impression & Plan: Assessment: 1.  COMBINED FORMS AGE RELATED CATARACT; Both Eyes (H25.813) 2.  PRIMARY OPEN ANGLE GLAUCOMA; Right Eye Severe, Left Eye Moderate (H40.1113, H40.1122) 3.  BLEPHARITIS; Right Upper Lid, Right Lower Lid, Left Upper Lid, Left Lower Lid (H01.001, H01.002,H01.004,H01.005) 4.  DERMATOCHALASIS, no surgery; Right Upper Lid, Left Upper Lid (H02.831, I45.809)  Plan: 1.  Cataract accounts for the patient's decreased vision. This visual impairment is not correctable with a tolerable change in glasses or contact lenses. Cataract surgery with  an implantation of a new lens should significantly improve the visual and functional status of the patient. Discussed all risks, benefits, alternatives, and potential complications. Discussed the procedures and recovery. Patient desires to have surgery. A-scan ordered and performed today for intra-ocular lens calculations. The surgery will be performed in order to improve vision for driving, reading, and for eye examinations. Recommend phacoemulsification with intra-ocular lens. Recommend Dextenza for post-operative pain and inflammation. Left Eye non-dominant - first. Dilates well - shugarcaine by protocol. 2.  Significant cupping OU. May be normal tension glaucoma. Detailed discussion about glaucoma today including importance of maintaining good follow up and following treatment plan, and the possibility of irreversible blindness as part of this disease process. Start latanoprost 1 drop every night at bedtime. Will need to do further glaucoma eval after Phaco PCIOL. 3.  Begin/continue lid scrubs. 4.  Asymptomatic, recommend observation for now. Findings, prognosis and treatment options reviewed.

## 2020-04-18 DIAGNOSIS — H25812 Combined forms of age-related cataract, left eye: Secondary | ICD-10-CM | POA: Diagnosis not present

## 2020-04-21 ENCOUNTER — Other Ambulatory Visit: Payer: Self-pay

## 2020-04-21 ENCOUNTER — Other Ambulatory Visit (HOSPITAL_COMMUNITY)
Admission: RE | Admit: 2020-04-21 | Discharge: 2020-04-21 | Disposition: A | Discharge status: Home / Self Care | Payer: Medicare HMO | Source: Ambulatory Visit | Attending: Ophthalmology | Admitting: Ophthalmology

## 2020-04-21 ENCOUNTER — Encounter (HOSPITAL_COMMUNITY)
Admission: RE | Admit: 2020-04-21 | Discharge: 2020-04-21 | Disposition: A | Discharge status: Home / Self Care | Payer: Medicare HMO | Source: Ambulatory Visit | Attending: Ophthalmology | Admitting: Ophthalmology

## 2020-04-21 DIAGNOSIS — Z01818 Encounter for other preprocedural examination: Secondary | ICD-10-CM | POA: Insufficient documentation

## 2020-04-21 DIAGNOSIS — Z20822 Contact with and (suspected) exposure to covid-19: Secondary | ICD-10-CM | POA: Insufficient documentation

## 2020-04-21 NOTE — Patient Instructions (Signed)
Your procedure is scheduled on:  04/25/2020               Report to [facility] at     12:00 PM.                Call this number if you have problems the morning of surgery: [phone]   Do not eat or drink :After Midnight.    Take these medicines the morning of surgery with A SIP OF WATER:  Namenda           Do not wear jewelry, make-up or nail polish.  Do not wear lotions, powders, or perfumes. You may wear deodorant.  Do not bring valuables to the hospital.  Contacts, dentures or bridgework may not be worn into surgery.  Patients discharged the day of surgery will not be allowed to drive home.  Name and phone number of your driver.                                                                                                                                       Cataract Surgery  A cataract is a clouding of the lens of the eye. When a lens becomes cloudy, vision is reduced based on the degree and nature of the clouding. Surgery may be needed to improve vision. Surgery removes the cloudy lens and usually replaces it with a substitute lens (intraocular lens, IOL). LET YOUR EYE DOCTOR KNOW ABOUT:  Allergies to food or medicine.   Medicines taken including herbs, eyedrops, over-the-counter medicines, and creams.   Use of steroids (by mouth or creams).   Previous problems with anesthetics or numbing medicine.   History of bleeding problems or blood clots.   Previous surgery.   Other health problems, including diabetes and kidney problems.   Possibility of pregnancy, if this applies.  RISKS AND COMPLICATIONS  Infection.   Inflammation of the eyeball (endophthalmitis) that can spread to both eyes (sympathetic ophthalmia).   Poor wound healing.   If an IOL is inserted, it can later fall out of proper position. This is very uncommon.   Clouding of the part of your eye that holds an IOL in place. This is called an "after-cataract." These are uncommon, but easily treated.    BEFORE THE PROCEDURE  Do not eat or drink anything except small amounts of water for 8 to 12 before your surgery, or as directed by your caregiver.    Unless you are told otherwise, continue any eyedrops you have been prescribed.   Talk to your primary caregiver about all other medicines that you take (both prescription and non-prescription). In some cases, you may need to stop or change medicines near the time of your surgery. This is most important if you are taking blood-thinning medicine. Do not stop medicines unless you are told to do so.   Arrange for someone to drive you to and from  the procedure.   Do not put contact lenses in either eye on the day of your surgery.  PROCEDURE There is more than one method for safely removing a cataract. Your doctor can explain the differences and help determine which is best for you. Phacoemulsification surgery is the most common form of cataract surgery.  An injection is given behind the eye or eyedrops are given to make this a painless procedure.   A small cut (incision) is made on the edge of the clear, dome-shaped surface that covers the front of the eye (cornea).   A tiny probe is painlessly inserted into the eye. This device gives off ultrasound waves that soften and break up the cloudy center of the lens. This makes it easier for the cloudy lens to be removed by suction.   An IOL may be implanted.   The normal lens of the eye is covered by a clear capsule. Part of that capsule is intentionally left in the eye to support the IOL.   Your surgeon may or may not use stitches to close the incision.  There are other forms of cataract surgery that require a larger incision and stiches to close the eye. This approach is taken in cases where the doctor feels that the cataract cannot be easily removed using phacoemulsification. AFTER THE PROCEDURE  When an IOL is implanted, it does not need care. It becomes a permanent part of your eye and cannot  be seen or felt.   Your doctor will schedule follow-up exams to check on your progress.   Review your other medicines with your doctor to see which can be resumed after surgery.   Use eyedrops or take medicine as prescribed by your doctor.  Document Released: 06/21/2011 Document Reviewed: 06/18/2011 [facility] Patient Information 2012 [place].  .Cataract Surgery Care After Refer to this sheet in the next few weeks. These instructions provide you with information on caring for yourself after your procedure. Your caregiver may also give you more specific instructions. Your treatment has been planned according to current medical practices, but problems sometimes occur. Call your caregiver if you have any problems or questions after your procedure.  HOME CARE INSTRUCTIONS   Avoid strenuous activities as directed by your caregiver.   Ask your caregiver when you can resume driving.   Use eyedrops or other medicines to help healing and control pressure inside your eye as directed by your caregiver.   Only take over-the-counter or prescription medicines for pain, discomfort, or fever as directed by your caregiver.   Do not to touch or rub your eyes.   You may be instructed to use a protective shield during the first few days and nights after surgery. If not, wear sunglasses to protect your eyes. This is to protect the eye from pressure or from being accidentally bumped.   Keep the area around your eye clean and dry. Avoid swimming or allowing water to hit you directly in the face while showering. Keep soap and shampoo out of your eyes.   Do not bend or lift heavy objects. Bending increases pressure in the eye. You can walk, climb stairs, and do light household chores.   Do not put a contact lens into the eye that had surgery until your caregiver says it is okay to do so.   Ask your doctor when you can return to work. This will depend on the kind of work that you do. If you work in a  dusty environment, you may  be advised to wear protective eyewear for a period of time.   Ask your caregiver when it will be safe to engage in sexual activity.   Continue with your regular eye exams as directed by your caregiver.  What to expect:  It is normal to feel itching and mild discomfort for a few days after cataract surgery. Some fluid discharge is also common, and your eye may be sensitive to light and touch.   After 1 to 2 days, even moderate discomfort should disappear. In most cases, healing will take about 6 weeks.   If you received an intraocular lens (IOL), you may notice that colors are very bright or have a blue tinge. Also, if you have been in bright sunlight, everything may appear reddish for a few hours. If you see these color tinges, it is because your lens is clear and no longer cloudy. Within a few months after receiving an IOL, these extra colors should go away. When you have healed, you will probably need new glasses.  SEEK MEDICAL CARE IF:   You have increased bruising around your eye.   You have discomfort not helped by medicine.  SEEK IMMEDIATE MEDICAL CARE IF:   You have a  fever.   You have a worsening or sudden vision loss.   You have redness, swelling, or increasing pain in the eye.   You have a thick discharge from the eye that had surgery.  MAKE SURE YOU:  Understand these instructions.   Will watch your condition.   Will get help right away if you are not doing well or get worse.  Document Released: 01/19/2005 Document Revised: 06/21/2011 Document Reviewed: 02/23/2011 [facility] Patient Information 2012 [place].    Monitored Anesthesia Care  Monitored anesthesia care is an anesthesia service for a medical procedure. Anesthesia is the loss of the ability to feel pain. It is produced by medications called anesthetics. It may affect a small area of your body (local anesthesia), a large area of your body (regional anesthesia), or your entire  body (general anesthesia). The need for monitored anesthesia care depends your procedure, your condition, and the potential need for regional or general anesthesia. It is often provided during procedures where:   General anesthesia may be needed if there are complications. This is because you need special care when you are under general anesthesia.    You will be under local or regional anesthesia. This is so that you are able to have higher levels of anesthesia if needed.    You will receive calming medications (sedatives). This is especially the case if sedatives are given to put you in a semi-conscious state of relaxation (deep sedation). This is because the amount of sedative needed to produce this state can be hard to predict. Too much of a sedative can produce general anesthesia. Monitored anesthesia care is performed by one or more caregivers who have special training in all types of anesthesia. You will need to meet with these caregivers before your procedure. During this meeting, they will ask you about your medical history. They will also give you instructions to follow. (For example, you will need to stop eating and drinking before your procedure. You may also need to stop or change medications you are taking.) During your procedure, your caregivers will stay with you. They will:   Watch your condition. This includes watching you blood pressure, breathing, and level of pain.    Diagnose and treat problems that occur.    Give  medications if they are needed. These may include calming medications (sedatives) and anesthetics.    Make sure you are comfortable.   Having monitored anesthesia care does not necessarily mean that you will be under anesthesia. It does mean that your caregivers will be able to manage anesthesia if you need it or if it occurs. It also means that you will be able to have a different type of anesthesia than you are having if you need it. When your procedure is complete,  your caregivers will continue to watch your condition. They will make sure any medications wear off before you are allowed to go home.  Document Released: 03/28/2005 Document Revised: 10/27/2012 Document Reviewed: 08/13/2012 [facility] Patient Information 2014 [place], [place].

## 2020-04-22 LAB — SARS CORONAVIRUS 2 (TAT 6-24 HRS): SARS Coronavirus 2: NEGATIVE

## 2020-04-25 ENCOUNTER — Ambulatory Visit (INDEPENDENT_AMBULATORY_CARE_PROVIDER_SITE_OTHER): Payer: Medicare HMO

## 2020-04-25 ENCOUNTER — Ambulatory Visit (HOSPITAL_COMMUNITY)
Admission: RE | Admit: 2020-04-25 | Discharge: 2020-04-25 | Disposition: A | Discharge status: Home / Self Care | Payer: Medicare HMO | Source: Ambulatory Visit | Attending: Ophthalmology | Admitting: Ophthalmology

## 2020-04-25 ENCOUNTER — Ambulatory Visit (HOSPITAL_COMMUNITY): Payer: Medicare HMO | Admitting: Anesthesiology

## 2020-04-25 ENCOUNTER — Encounter (HOSPITAL_COMMUNITY): Payer: Self-pay | Admitting: Ophthalmology

## 2020-04-25 ENCOUNTER — Other Ambulatory Visit: Payer: Self-pay

## 2020-04-25 ENCOUNTER — Encounter (HOSPITAL_COMMUNITY)
Admission: RE | Disposition: A | Discharge status: Home / Self Care | Payer: Self-pay | Source: Ambulatory Visit | Attending: Ophthalmology

## 2020-04-25 DIAGNOSIS — H01001 Unspecified blepharitis right upper eyelid: Secondary | ICD-10-CM | POA: Insufficient documentation

## 2020-04-25 DIAGNOSIS — H01005 Unspecified blepharitis left lower eyelid: Secondary | ICD-10-CM | POA: Insufficient documentation

## 2020-04-25 DIAGNOSIS — H02831 Dermatochalasis of right upper eyelid: Secondary | ICD-10-CM | POA: Insufficient documentation

## 2020-04-25 DIAGNOSIS — F0390 Unspecified dementia without behavioral disturbance: Secondary | ICD-10-CM | POA: Diagnosis not present

## 2020-04-25 DIAGNOSIS — H401113 Primary open-angle glaucoma, right eye, severe stage: Secondary | ICD-10-CM | POA: Diagnosis not present

## 2020-04-25 DIAGNOSIS — H02834 Dermatochalasis of left upper eyelid: Secondary | ICD-10-CM | POA: Insufficient documentation

## 2020-04-25 DIAGNOSIS — H01004 Unspecified blepharitis left upper eyelid: Secondary | ICD-10-CM | POA: Diagnosis not present

## 2020-04-25 DIAGNOSIS — H01002 Unspecified blepharitis right lower eyelid: Secondary | ICD-10-CM | POA: Insufficient documentation

## 2020-04-25 DIAGNOSIS — H25812 Combined forms of age-related cataract, left eye: Secondary | ICD-10-CM | POA: Insufficient documentation

## 2020-04-25 DIAGNOSIS — H401122 Primary open-angle glaucoma, left eye, moderate stage: Secondary | ICD-10-CM | POA: Diagnosis not present

## 2020-04-25 DIAGNOSIS — H25813 Combined forms of age-related cataract, bilateral: Secondary | ICD-10-CM | POA: Diagnosis not present

## 2020-04-25 HISTORY — PX: CATARACT EXTRACTION W/PHACO: SHX586

## 2020-04-25 SURGERY — PHACOEMULSIFICATION, CATARACT, WITH IOL INSERTION
Anesthesia: Monitor Anesthesia Care | Site: Eye | Laterality: Left

## 2020-04-25 MED ORDER — BSS IO SOLN
INTRAOCULAR | Status: DC | PRN
  Administered 2020-04-25: 15 mL via INTRAOCULAR

## 2020-04-25 MED ORDER — PHENYLEPHRINE HCL 2.5 % OP SOLN
1.0000 [drp] | OPHTHALMIC | Status: AC | PRN
  Administered 2020-04-25 (×3): 1 [drp] via OPHTHALMIC

## 2020-04-25 MED ORDER — NEOMYCIN-POLYMYXIN-DEXAMETH 3.5-10000-0.1 OP SUSP
OPHTHALMIC | Status: DC | PRN
  Administered 2020-04-25: 1 [drp] via OPHTHALMIC

## 2020-04-25 MED ORDER — TETRACAINE HCL 0.5 % OP SOLN
1.0000 [drp] | OPHTHALMIC | Status: AC | PRN
  Administered 2020-04-25 (×3): 1 [drp] via OPHTHALMIC

## 2020-04-25 MED ORDER — LIDOCAINE HCL 3.5 % OP GEL
1.0000 "application " | Freq: Once | OPHTHALMIC | Status: AC
  Administered 2020-04-25: 1 via OPHTHALMIC

## 2020-04-25 MED ORDER — EPINEPHRINE PF 1 MG/ML IJ SOLN
INTRAMUSCULAR | Status: AC
  Filled 2020-04-25: qty 2

## 2020-04-25 MED ORDER — LIDOCAINE HCL (PF) 1 % IJ SOLN
INTRAOCULAR | Status: DC | PRN
  Administered 2020-04-25: 1 mL via OPHTHALMIC

## 2020-04-25 MED ORDER — EPINEPHRINE PF 1 MG/ML IJ SOLN
INTRAOCULAR | Status: DC | PRN
  Administered 2020-04-25: 500 mL

## 2020-04-25 MED ORDER — PROVISC 10 MG/ML IO SOLN
INTRAOCULAR | Status: DC | PRN
  Administered 2020-04-25: 0.85 mL via INTRAOCULAR

## 2020-04-25 MED ORDER — SODIUM HYALURONATE 23 MG/ML IO SOLN
INTRAOCULAR | Status: DC | PRN
  Administered 2020-04-25: 0.6 mL via INTRAOCULAR

## 2020-04-25 MED ORDER — POVIDONE-IODINE 5 % OP SOLN
OPHTHALMIC | Status: DC | PRN
  Administered 2020-04-25: 1 via OPHTHALMIC

## 2020-04-25 MED ORDER — CYCLOPENTOLATE-PHENYLEPHRINE 0.2-1 % OP SOLN
1.0000 [drp] | OPHTHALMIC | Status: AC | PRN
  Administered 2020-04-25 (×3): 1 [drp] via OPHTHALMIC

## 2020-04-25 MED ORDER — DEXMEDETOMIDINE HCL IN NACL 200 MCG/50ML IV SOLN
INTRAVENOUS | Status: AC
  Filled 2020-04-25: qty 50

## 2020-04-25 SURGICAL SUPPLY — 21 items
CLOTH BEACON ORANGE TIMEOUT ST (SAFETY) ×2 IMPLANT
DEVICE MILOOP (MISCELLANEOUS) IMPLANT
EYE SHIELD UNIVERSAL CLEAR (GAUZE/BANDAGES/DRESSINGS) ×2 IMPLANT
GLOVE BIOGEL PI IND STRL 6.5 (GLOVE) ×1 IMPLANT
GLOVE BIOGEL PI IND STRL 7.0 (GLOVE) ×1 IMPLANT
GLOVE BIOGEL PI IND STRL 7.5 (GLOVE) ×1 IMPLANT
GLOVE BIOGEL PI INDICATOR 6.5 (GLOVE) ×1
GLOVE BIOGEL PI INDICATOR 7.0 (GLOVE) ×1
GLOVE BIOGEL PI INDICATOR 7.5 (GLOVE) ×1
GOWN STRL REUS W/TWL XL LVL3 (GOWN DISPOSABLE) ×2 IMPLANT
LENS ALC ACRYL/TECN (Ophthalmic Related) ×2 IMPLANT
MILOOP DEVICE (MISCELLANEOUS)
NEEDLE HYPO 18GX1.5 BLUNT FILL (NEEDLE) ×2 IMPLANT
PAD ARMBOARD 7.5X6 YLW CONV (MISCELLANEOUS) ×2 IMPLANT
RING MALYGIN (MISCELLANEOUS) IMPLANT
RING MALYGIN 7.0 (MISCELLANEOUS) IMPLANT
SYR TB 1ML LL NO SAFETY (SYRINGE) ×2 IMPLANT
TAPE SURG TRANSPORE 1 IN (GAUZE/BANDAGES/DRESSINGS) ×1 IMPLANT
TAPE SURGICAL TRANSPORE 1 IN (GAUZE/BANDAGES/DRESSINGS) ×2
VISCOELASTIC ADDITIONAL (OPHTHALMIC RELATED) ×2 IMPLANT
WATER STERILE IRR 250ML POUR (IV SOLUTION) ×2 IMPLANT

## 2020-04-25 NOTE — Progress Notes (Signed)
Dr.Battula and Eilene Ghazi spoke with patients brother Michael/Rickey Amie Critchley, to make him aware of anesthesia effects on patients with memory impairment, risk, and benefits. Brother is in agreement to proceed.

## 2020-04-25 NOTE — Discharge Instructions (Signed)
Please discharge patient when stable, will follow up today with Dr. Veda Arrellano at the [facility] office immediately following discharge.  Leave shield in place until visit.  All paperwork with discharge instructions will be given at the office.  [facility] Address:  [address]  [place], [place] [address]  

## 2020-04-25 NOTE — Anesthesia Postprocedure Evaluation (Signed)
Anesthesia Post Note  Patient: Wendy Mccann  Procedure(s) Performed: CATARACT EXTRACTION PHACO AND INTRAOCULAR LENS PLACEMENT LEFT EYE (Left Eye)  Patient location during evaluation: Short Stay Anesthesia Type: MAC Level of consciousness: awake and alert and patient cooperative Pain management: satisfactory to patient Vital Signs Assessment: post-procedure vital signs reviewed and stable Respiratory status: spontaneous breathing Cardiovascular status: stable Postop Assessment: no apparent nausea or vomiting Anesthetic complications: no   No complications documented.   Last Vitals:  Vitals:   04/25/20 1245 04/25/20 1404  BP: 122/65 136/69  Pulse: (!) 47 (!) 48  Resp: 18 16  Temp:  36.5 C  SpO2: 98% 100%    Last Pain:  Vitals:   04/25/20 1404  TempSrc: Oral  PainSc: 0-No pain                 Merrily Tegeler

## 2020-04-25 NOTE — Anesthesia Procedure Notes (Signed)
Procedure Name: MAC Date/Time: 04/25/2020 1:43 PM Performed by: Vista Deck, CRNA Pre-anesthesia Checklist: Patient identified, Emergency Drugs available, Suction available, Timeout performed and Patient being monitored Patient Re-evaluated:Patient Re-evaluated prior to induction Oxygen Delivery Method: Nasal Cannula

## 2020-04-25 NOTE — Op Note (Signed)
Date of procedure: 04/25/20  Pre-operative diagnosis: Visually significant age-related combined cataract, Left Eye (H25.812)  Post-operative diagnosis: Visually significant age-related combined cataract, Left Eye (H25.812)  Procedure: Removal of cataract via phacoemulsification and insertion of intra-ocular lens Wynetta Emery and [employer] DCB00  +24.5D into the capsular bag of the Left Eye  Attending surgeon: Gerda Diss. Leylani Duley, MD, MA  Anesthesia: MAC, Topical Akten  Complications: None  Estimated Blood Loss: <14m (minimal)  Specimens: None  Implants: As above  Indications:  Visually significant age-related cataract, Left Eye  Procedure:  The patient was seen and identified in the pre-operative area. The operative eye was identified and dilated.  The operative eye was marked.  Topical anesthesia was administered to the operative eye.     The patient was then to the operative suite and placed in the supine position.  A timeout was performed confirming the patient, procedure to be performed, and all other relevant information.   The patient's face was prepped and draped in the usual fashion for intra-ocular surgery.  A lid speculum was placed into the operative eye and the surgical microscope moved into place and focused.  An inferotemporal paracentesis was created using a 20 gauge paracentesis blade.  Shugarcaine was injected into the anterior chamber.  Viscoelastic was injected into the anterior chamber.  A temporal clear-corneal main wound incision was created using a 2.42mmicrokeratome.  A continuous curvilinear capsulorrhexis was initiated using an irrigating cystitome and completed using capsulorrhexis forceps.  Hydrodissection and hydrodeliniation were performed.  Viscoelastic was injected into the anterior chamber.  A phacoemulsification handpiece and a chopper as a second instrument were used to remove the nucleus and epinucleus. The irrigation/aspiration handpiece was used to remove  any remaining cortical material.   The capsular bag was reinflated with viscoelastic, checked, and found to be intact.  The intraocular lens was inserted into the capsular bag.  The irrigation/aspiration handpiece was used to remove any remaining viscoelastic.  The clear corneal wound and paracentesis wounds were then hydrated and checked with Weck-Cels to be watertight.  The lid-speculum was removed.  The drape was removed.  The patient's face was cleaned with a wet and dry 4x4.   Maxitrol was instilled in the eye. A clear shield was taped over the eye. The patient was taken to the post-operative care unit in good condition, having tolerated the procedure well.  Post-Op Instructions: [facility] and will receive all other orders and instructions.

## 2020-04-25 NOTE — Transfer of Care (Signed)
Immediate Anesthesia Transfer of Care Note  Patient: Wendy Mccann  Procedure(s) Performed: CATARACT EXTRACTION PHACO AND INTRAOCULAR LENS PLACEMENT LEFT EYE (Left Eye)  Patient Location: Short Stay  Anesthesia Type:MAC  Level of Consciousness: awake, alert  and patient cooperative  Airway & Oxygen Therapy: Patient Spontanous Breathing  Post-op Assessment: Report given to RN and Post -op Vital signs reviewed and stable  Post vital signs: Reviewed and stable  Last Vitals:  Vitals Value Taken Time  BP    Temp    Pulse    Resp    SpO2      Last Pain:  Vitals:   04/25/20 1216  TempSrc: Oral  PainSc: 0-No pain         Complications: No complications documented.

## 2020-04-25 NOTE — Anesthesia Preprocedure Evaluation (Signed)
Anesthesia Evaluation  Patient identified by MRN, date of birth, ID band Patient awake    Reviewed: Allergy & Precautions, NPO status , Patient's Chart, lab work & pertinent test results  History of Anesthesia Complications Negative for: history of anesthetic complications  Airway Mallampati: II  TM Distance: >3 FB Neck ROM: Full    Dental  (+) Upper Dentures, Lower Dentures   Pulmonary neg pulmonary ROS,    Pulmonary exam normal breath sounds clear to auscultation       Cardiovascular Exercise Tolerance: Good Normal cardiovascular exam Rhythm:Regular Rate:Normal     Neuro/Psych PSYCHIATRIC DISORDERS Dementia negative neurological ROS     GI/Hepatic negative GI ROS, Neg liver ROS,   Endo/Other  negative endocrine ROS  Renal/GU negative Renal ROS     Musculoskeletal  (+) Arthritis ,   Abdominal   Peds  Hematology  (+) anemia ,   Anesthesia Other Findings   Reproductive/Obstetrics                             Anesthesia Physical Anesthesia Plan  ASA: II  Anesthesia Plan: MAC   Post-op Pain Management:    Induction:   PONV Risk Score and Plan:   Airway Management Planned: Nasal Cannula and Natural Airway  Additional Equipment:   Intra-op Plan:   Post-operative Plan:   Informed Consent: I have reviewed the patients History and Physical, chart, labs and discussed the procedure including the risks, benefits and alternatives for the proposed anesthesia with the patient or authorized representative who has indicated his/her understanding and acceptance.     Dental advisory given  Plan Discussed with: CRNA and Surgeon  Anesthesia Plan Comments: (Patient and her brother understands that anesthesia medication may affect her memory.)      Anesthesia Quick Evaluation

## 2020-04-25 NOTE — Interval H&P Note (Signed)
History and Physical Interval Note:  04/25/2020 1:36 PM  [name]  has presented today for surgery, with the diagnosis of Nuclear sclerotic cataract - Left eye.  The various methods of treatment have been discussed with the patient and family. After consideration of risks, benefits and other options for treatment, the patient has consented to  Procedure(s) with comments: CATARACT EXTRACTION PHACO AND INTRAOCULAR LENS PLACEMENT (IOC) (Left) - left as a surgical intervention.  The patient's history has been reviewed, patient examined, no change in status, stable for surgery.  I have reviewed the patient's chart and labs.  Questions were answered to the patient's satisfaction.     Wendy Mccann

## 2020-04-26 ENCOUNTER — Encounter (HOSPITAL_COMMUNITY): Payer: Self-pay | Admitting: Ophthalmology

## 2020-04-26 NOTE — Progress Notes (Signed)
Nurse talked to pt's  brother Clide Cliff and he is administering her eyedrops as instructed by [employer] office. Pt was a little confused today and thought she had to come back at hospital to pick up her eyedrops.

## 2020-05-02 ENCOUNTER — Ambulatory Visit (INDEPENDENT_AMBULATORY_CARE_PROVIDER_SITE_OTHER): Payer: Medicare HMO

## 2020-05-03 NOTE — H&P (Signed)
Surgical History & Physical  Patient Name: Wendy Mccann DOB: [birth year]  Surgery: Cataract extraction with intraocular lens implant phacoemulsification; Right Eye  Surgeon: Fabio Pierce MD Surgery Date:  05/09/2020 Pre-Op Date:  05/02/2020  HPI: A 28 Yr. old female patient The patient is returning after cataract surgery. The left eye is affected. Status post cataract surgery, which began 1 weeks ago: Since the last visit, the affected area feels improvement and is doing well. The patient's vision is improved and stable. Patient is following medication instructions. Omni BID OS. The patient experiences no increase floaters. The patient complains of difficulty when recognizing people at distance and near. The right eye is affected. The episode is constant. The condition's severity is worsening. The condition is worse at night. The complaint is associated with glares and star bursting lights. This is This is negatively affecting the patient's quality of life. HPI was performed by Fabio Pierce .  Medical History: Glaucoma Cataracts Arthritis Vertigo  Review of Systems Eyes Vision loss All recorded systems are negative except as noted above.  Social   Never smoked  Medication Prednisolone-gatiflox-bromfenac, Latanoprost,  Hydroxychloroquine, Tylenol,   Sx/Procedures Phaco c IOL OS,   Drug Allergies   NKDA  History & Physical: Heent:  Cataract, right eye NECK: supple without bruits LUNGS: lungs clear to auscultation CV: regular rate and rhythm Abdomen: soft and non-tender  Impression & Plan: Assessment: 1.  CATARACT EXTRACTION STATUS; Left Eye (Z98.42) 2.  INTRAOCULAR LENS IOL (Z96.1) 3.  COMBINED FORMS AGE RELATED CATARACT; Both Eyes (H25.813) 4.  PRIMARY OPEN ANGLE GLAUCOMA; Right Eye Severe, Left Eye Moderate (H40.1113, H40.1122)  Plan: 1.  1 week after cataract surgery. Doing well with improved vision and normal eye pressure. Call with any problems or  concerns. Continue Gati-Brom-Pred 2x/day for 3 more weeks. 2.  Stable. Doing well since surgery Continue Post-op medications 3.  Dilates well - shugarcaine by protocol. Cataract accounts for the patient's decreased vision. This visual impairment is not correctable with a tolerable change in glasses or contact lenses. Cataract surgery with an implantation of a new lens should significantly improve the visual and functional status of the patient. Discussed all risks, benefits, alternatives, and potential complications. Discussed the procedures and recovery. Patient desires to have surgery. A-scan ordered and performed today for intra-ocular lens calculations. The surgery will be performed in order to improve vision for driving, reading, and for eye examinations. Recommend phacoemulsification with intra-ocular lens. Recommend Dextenza for post-operative pain and inflammation. Right Eye. Surgery required to correct imbalance of vision. 4.  Significant cupping OU. Detailed discussion about glaucoma today including importance of maintaining good follow up and following treatment plan, and the possibility of irreversible blindness as part of this disease process. Start latanoprost 1 drop every night at bedtime. Will need to do further glaucoma eval after Phaco PCIOL.

## 2020-05-04 ENCOUNTER — Other Ambulatory Visit: Payer: Self-pay | Admitting: Neurology

## 2020-05-05 ENCOUNTER — Other Ambulatory Visit: Payer: Self-pay

## 2020-05-05 ENCOUNTER — Encounter (HOSPITAL_COMMUNITY)
Admission: RE | Admit: 2020-05-05 | Discharge: 2020-05-05 | Disposition: A | Discharge status: Home / Self Care | Payer: Medicare HMO | Source: Ambulatory Visit | Attending: Ophthalmology | Admitting: Ophthalmology

## 2020-05-06 ENCOUNTER — Other Ambulatory Visit (HOSPITAL_COMMUNITY)
Admission: RE | Admit: 2020-05-06 | Discharge: 2020-05-06 | Disposition: A | Discharge status: Home / Self Care | Payer: Medicare HMO | Source: Ambulatory Visit | Attending: Ophthalmology | Admitting: Ophthalmology

## 2020-05-06 ENCOUNTER — Other Ambulatory Visit: Payer: Self-pay

## 2020-05-06 DIAGNOSIS — Z20822 Contact with and (suspected) exposure to covid-19: Secondary | ICD-10-CM | POA: Diagnosis not present

## 2020-05-06 DIAGNOSIS — Z01812 Encounter for preprocedural laboratory examination: Secondary | ICD-10-CM | POA: Diagnosis not present

## 2020-05-07 LAB — SARS CORONAVIRUS 2 (TAT 6-24 HRS): SARS Coronavirus 2: NEGATIVE

## 2020-05-09 ENCOUNTER — Encounter (HOSPITAL_COMMUNITY)
Admission: RE | Disposition: A | Discharge status: Home / Self Care | Payer: Self-pay | Source: Ambulatory Visit | Attending: Ophthalmology

## 2020-05-09 ENCOUNTER — Ambulatory Visit (HOSPITAL_COMMUNITY)
Admission: RE | Admit: 2020-05-09 | Discharge: 2020-05-09 | Disposition: A | Discharge status: Home / Self Care | Payer: Medicare HMO | Source: Ambulatory Visit | Attending: Ophthalmology | Admitting: Ophthalmology

## 2020-05-09 ENCOUNTER — Encounter (HOSPITAL_COMMUNITY): Payer: Self-pay | Admitting: Ophthalmology

## 2020-05-09 ENCOUNTER — Ambulatory Visit (HOSPITAL_COMMUNITY): Payer: Medicare HMO | Admitting: Anesthesiology

## 2020-05-09 ENCOUNTER — Other Ambulatory Visit: Payer: Self-pay

## 2020-05-09 DIAGNOSIS — Z961 Presence of intraocular lens: Secondary | ICD-10-CM | POA: Insufficient documentation

## 2020-05-09 DIAGNOSIS — H401122 Primary open-angle glaucoma, left eye, moderate stage: Secondary | ICD-10-CM | POA: Insufficient documentation

## 2020-05-09 DIAGNOSIS — Z9842 Cataract extraction status, left eye: Secondary | ICD-10-CM | POA: Diagnosis not present

## 2020-05-09 DIAGNOSIS — H25811 Combined forms of age-related cataract, right eye: Secondary | ICD-10-CM | POA: Insufficient documentation

## 2020-05-09 DIAGNOSIS — H401113 Primary open-angle glaucoma, right eye, severe stage: Secondary | ICD-10-CM | POA: Insufficient documentation

## 2020-05-09 DIAGNOSIS — F0390 Unspecified dementia without behavioral disturbance: Secondary | ICD-10-CM | POA: Diagnosis not present

## 2020-05-09 HISTORY — PX: CATARACT EXTRACTION W/PHACO: SHX586

## 2020-05-09 SURGERY — PHACOEMULSIFICATION, CATARACT, WITH IOL INSERTION
Anesthesia: Monitor Anesthesia Care | Site: Eye | Laterality: Right

## 2020-05-09 MED ORDER — NEOMYCIN-POLYMYXIN-DEXAMETH 3.5-10000-0.1 OP SUSP
OPHTHALMIC | Status: DC | PRN
  Administered 2020-05-09: 1 [drp] via OPHTHALMIC

## 2020-05-09 MED ORDER — LIDOCAINE HCL 3.5 % OP GEL
1.0000 "application " | Freq: Once | OPHTHALMIC | Status: AC
  Administered 2020-05-09: 1 via OPHTHALMIC

## 2020-05-09 MED ORDER — LIDOCAINE HCL (PF) 1 % IJ SOLN
INTRAOCULAR | Status: DC | PRN
  Administered 2020-05-09: 1 mL via OPHTHALMIC

## 2020-05-09 MED ORDER — BSS IO SOLN
INTRAOCULAR | Status: DC | PRN
  Administered 2020-05-09: 15 mL via INTRAOCULAR

## 2020-05-09 MED ORDER — CYCLOPENTOLATE-PHENYLEPHRINE 0.2-1 % OP SOLN
1.0000 [drp] | OPHTHALMIC | Status: AC | PRN
  Administered 2020-05-09 (×3): 1 [drp] via OPHTHALMIC

## 2020-05-09 MED ORDER — PHENYLEPHRINE HCL 2.5 % OP SOLN
1.0000 [drp] | OPHTHALMIC | Status: AC | PRN
  Administered 2020-05-09 (×3): 1 [drp] via OPHTHALMIC

## 2020-05-09 MED ORDER — PROVISC 10 MG/ML IO SOLN
INTRAOCULAR | Status: DC | PRN
  Administered 2020-05-09: 0.85 mL via INTRAOCULAR

## 2020-05-09 MED ORDER — POVIDONE-IODINE 5 % OP SOLN
OPHTHALMIC | Status: DC | PRN
  Administered 2020-05-09: 1 via OPHTHALMIC

## 2020-05-09 MED ORDER — SODIUM HYALURONATE 23 MG/ML IO SOLN
INTRAOCULAR | Status: DC | PRN
  Administered 2020-05-09: 0.6 mL via INTRAOCULAR

## 2020-05-09 MED ORDER — TETRACAINE HCL 0.5 % OP SOLN
1.0000 [drp] | OPHTHALMIC | Status: AC | PRN
  Administered 2020-05-09 (×3): 1 [drp] via OPHTHALMIC

## 2020-05-09 MED ORDER — EPINEPHRINE PF 1 MG/ML IJ SOLN
INTRAOCULAR | Status: DC | PRN
  Administered 2020-05-09: 500 mL

## 2020-05-09 SURGICAL SUPPLY — 22 items
CLOTH BEACON ORANGE TIMEOUT ST (SAFETY) ×2 IMPLANT
DEVICE MILOOP (MISCELLANEOUS) IMPLANT
EYE SHIELD UNIVERSAL CLEAR (GAUZE/BANDAGES/DRESSINGS) ×2 IMPLANT
GLOVE BIOGEL PI IND STRL 6.5 (GLOVE) IMPLANT
GLOVE BIOGEL PI IND STRL 7.0 (GLOVE) ×1 IMPLANT
GLOVE BIOGEL PI IND STRL 7.5 (GLOVE) ×2 IMPLANT
GLOVE BIOGEL PI INDICATOR 6.5 (GLOVE)
GLOVE BIOGEL PI INDICATOR 7.0 (GLOVE) ×1
GLOVE BIOGEL PI INDICATOR 7.5 (GLOVE) ×2
GOWN STRL REUS W/ TWL XL LVL3 (GOWN DISPOSABLE) ×1 IMPLANT
GOWN STRL REUS W/TWL XL LVL3 (GOWN DISPOSABLE) ×2
LENS ALC ACRYL/TECN (Ophthalmic Related) ×2 IMPLANT
MILOOP DEVICE (MISCELLANEOUS)
NEEDLE HYPO 18GX1.5 BLUNT FILL (NEEDLE) ×2 IMPLANT
PAD ARMBOARD 7.5X6 YLW CONV (MISCELLANEOUS) ×2 IMPLANT
RING MALYGIN (MISCELLANEOUS) IMPLANT
RING MALYGIN 7.0 (MISCELLANEOUS) IMPLANT
SYR TB 1ML LL NO SAFETY (SYRINGE) ×2 IMPLANT
TAPE SURG TRANSPORE 1 IN (GAUZE/BANDAGES/DRESSINGS) ×1 IMPLANT
TAPE SURGICAL TRANSPORE 1 IN (GAUZE/BANDAGES/DRESSINGS) ×2
VISCOELASTIC ADDITIONAL (OPHTHALMIC RELATED) IMPLANT
WATER STERILE IRR 250ML POUR (IV SOLUTION) ×2 IMPLANT

## 2020-05-09 NOTE — Op Note (Signed)
Date of procedure: 05/09/20  Pre-operative diagnosis:  Visually significant combined form age-related cataract, Right Eye (H25.811)  Post-operative diagnosis:  Visually significant combined form age-related cataract, Right Eye (H25.811)  Procedure: Removal of cataract via phacoemulsification and insertion of intra-ocular lens Wynetta Emery and [employer] DCB00  +24.5D into the capsular bag of the Right Eye  Attending surgeon: Gerda Diss. Marcus Groll, MD, MA  Anesthesia: MAC, Topical Akten  Complications: None  Estimated Blood Loss: <31m (minimal)  Specimens: None  Implants: As above  Indications:  Visually significant age-related cataract, Right Eye  Procedure:  The patient was seen and identified in the pre-operative area. The operative eye was identified and dilated.  The operative eye was marked.  Topical anesthesia was administered to the operative eye.     The patient was then to the operative suite and placed in the supine position.  A timeout was performed confirming the patient, procedure to be performed, and all other relevant information.   The patient's face was prepped and draped in the usual fashion for intra-ocular surgery.  A lid speculum was placed into the operative eye and the surgical microscope moved into place and focused.  A superotemporal paracentesis was created using a 20 gauge paracentesis blade.  Shugarcaine was injected into the anterior chamber.  Viscoelastic was injected into the anterior chamber.  A temporal clear-corneal main wound incision was created using a 2.455mmicrokeratome.  A continuous curvilinear capsulorrhexis was initiated using an irrigating cystitome and completed using capsulorrhexis forceps.  Hydrodissection and hydrodeliniation were performed.  Viscoelastic was injected into the anterior chamber.  A phacoemulsification handpiece and a chopper as a second instrument were used to remove the nucleus and epinucleus. The irrigation/aspiration handpiece was  used to remove any remaining cortical material.   The capsular bag was reinflated with viscoelastic, checked, and found to be intact.  The intraocular lens was inserted into the capsular bag.  The irrigation/aspiration handpiece was used to remove any remaining viscoelastic.  The clear corneal wound and paracentesis wounds were then hydrated and checked with Weck-Cels to be watertight.  The lid-speculum was removed.  The drape was removed.  The patient's face was cleaned with a wet and dry 4x4.   Maxitrol was instilled in the eye. A clear shield was taped over the eye. The patient was taken to the post-operative care unit in good condition, having tolerated the procedure well.  Post-Op Instructions: [facility] and will receive all other orders and instructions.

## 2020-05-09 NOTE — Interval H&P Note (Signed)
History and Physical Interval Note:  05/09/2020 9:14 AM  Wendy Mccann  has presented today for surgery, with the diagnosis of Nuclear sclerotic cataract - Right eye.  The various methods of treatment have been discussed with the patient and family. After consideration of risks, benefits and other options for treatment, the patient has consented to  Procedure(s) with comments: CATARACT EXTRACTION PHACO AND INTRAOCULAR LENS PLACEMENT (IOC) (Right) - right as a surgical intervention.  The patient's history has been reviewed, patient examined, no change in status, stable for surgery.  I have reviewed the patient's chart and labs.  Questions were answered to the patient's satisfaction.     Fabio Pierce

## 2020-05-09 NOTE — Transfer of Care (Signed)
Immediate Anesthesia Transfer of Care Note  Patient: Wendy Mccann  Procedure(s) Performed: CATARACT EXTRACTION PHACO AND INTRAOCULAR LENS PLACEMENT (IOC) (Right Eye)  Patient Location: Short Stay  Anesthesia Type:MAC  Level of Consciousness: awake, alert  and oriented  Airway & Oxygen Therapy: Patient Spontanous Breathing  Post-op Assessment: Report given to RN, Post -op Vital signs reviewed and stable and Patient moving all extremities X 4  Post vital signs: Reviewed and stable  Last Vitals:  Vitals Value Taken Time  BP    Temp    Pulse    Resp    SpO2      Last Pain:  Vitals:   05/09/20 0857  TempSrc: Oral      Patients Stated Pain Goal: 5 (85/88/50 2774)  Complications: No complications documented.

## 2020-05-09 NOTE — Discharge Instructions (Signed)
Please discharge patient when stable, will follow up today with Dr. Humna Moorehouse at the [facility] office immediately following discharge.  Leave shield in place until visit.  All paperwork with discharge instructions will be given at the office.  [facility] Address:  [address]  [place], [place] [address]  

## 2020-05-09 NOTE — Anesthesia Postprocedure Evaluation (Signed)
Anesthesia Post Note  Patient: Wendy Mccann  Procedure(s) Performed: CATARACT EXTRACTION PHACO AND INTRAOCULAR LENS PLACEMENT (IOC) (Right Eye)  Patient location during evaluation: Phase II Anesthesia Type: MAC Level of consciousness: awake and alert Pain management: pain level controlled Vital Signs Assessment: post-procedure vital signs reviewed and stable Respiratory status: nonlabored ventilation, respiratory function stable and spontaneous breathing Cardiovascular status: blood pressure returned to baseline Postop Assessment: no apparent nausea or vomiting Anesthetic complications: no   No complications documented.   Last Vitals:  Vitals:   05/09/20 0857  BP: (!) 146/58  Pulse: (!) 49  Temp: 36.6 C  SpO2: 100%    Last Pain:  Vitals:   05/09/20 0857  TempSrc: Oral                 Orlie Dakin

## 2020-05-09 NOTE — Anesthesia Procedure Notes (Signed)
Procedure Name: MAC Date/Time: 05/09/2020 9:34 AM Performed by: Orlie Dakin, CRNA Pre-anesthesia Checklist: Patient identified, Emergency Drugs available, Suction available and Patient being monitored Patient Re-evaluated:Patient Re-evaluated prior to induction Oxygen Delivery Method: Nasal cannula Placement Confirmation: positive ETCO2

## 2020-05-09 NOTE — Anesthesia Preprocedure Evaluation (Signed)
Anesthesia Evaluation  Patient identified by MRN, date of birth, ID band Patient awake    Reviewed: Allergy & Precautions, NPO status , Patient's Chart, lab work & pertinent test results  History of Anesthesia Complications Negative for: history of anesthetic complications  Airway Mallampati: II  TM Distance: >3 FB Neck ROM: Full    Dental  (+) Upper Dentures, Lower Dentures   Pulmonary neg pulmonary ROS,    Pulmonary exam normal breath sounds clear to auscultation       Cardiovascular Exercise Tolerance: Good Normal cardiovascular exam Rhythm:Regular Rate:Normal     Neuro/Psych PSYCHIATRIC DISORDERS Dementia negative neurological ROS     GI/Hepatic negative GI ROS, Neg liver ROS,   Endo/Other  negative endocrine ROS  Renal/GU negative Renal ROS     Musculoskeletal  (+) Arthritis ,   Abdominal   Peds  Hematology  (+) anemia ,   Anesthesia Other Findings   Reproductive/Obstetrics                             Anesthesia Physical  Anesthesia Plan  ASA: II  Anesthesia Plan: MAC   Post-op Pain Management:    Induction:   PONV Risk Score and Plan:   Airway Management Planned: Nasal Cannula and Natural Airway  Additional Equipment:   Intra-op Plan:   Post-operative Plan:   Informed Consent: I have reviewed the patients History and Physical, chart, labs and discussed the procedure including the risks, benefits and alternatives for the proposed anesthesia with the patient or authorized representative who has indicated his/her understanding and acceptance.     Dental advisory given  Plan Discussed with: CRNA and Surgeon  Anesthesia Plan Comments: (Patient and her brother understands that anesthesia medication may affect her memory.)        Anesthesia Quick Evaluation

## 2020-05-11 ENCOUNTER — Encounter (HOSPITAL_COMMUNITY): Payer: Self-pay | Admitting: Ophthalmology

## 2020-05-13 ENCOUNTER — Other Ambulatory Visit: Payer: Self-pay | Admitting: Neurology

## 2020-05-16 ENCOUNTER — Other Ambulatory Visit (INDEPENDENT_AMBULATORY_CARE_PROVIDER_SITE_OTHER): Payer: Self-pay | Admitting: Nurse Practitioner

## 2020-05-16 DIAGNOSIS — H3410 Central retinal artery occlusion, unspecified eye: Secondary | ICD-10-CM

## 2020-05-16 NOTE — Progress Notes (Signed)
Dr. Karilyn Cota, I received call from patient's eye doctor's office to let me know that they found signs of central retinal artery occlusion.  They need her to be worked into the schedule as soon as possible for further evaluation.  They also recommended cardiac echocardiogram be ordered for further evaluation.  Thus, cardiac echocardiogram ordered as well as bilateral carotid duplex ultrasound and referral to cardiology for possible Holter monitoring to determine any signs of cardiogenic embolism.  We will also refer patient to neurology for further evaluation and management.  She will see you tomorrow afternoon in case you decide you want to do additional blood work for evaluation.  Nellie, please read the above and make sure the above referrals and tests are ordered and set up. Thank you.

## 2020-05-17 ENCOUNTER — Other Ambulatory Visit: Payer: Self-pay

## 2020-05-17 ENCOUNTER — Ambulatory Visit (INDEPENDENT_AMBULATORY_CARE_PROVIDER_SITE_OTHER): Payer: Medicare HMO | Admitting: Internal Medicine

## 2020-05-17 ENCOUNTER — Encounter (INDEPENDENT_AMBULATORY_CARE_PROVIDER_SITE_OTHER): Payer: Self-pay | Admitting: Internal Medicine

## 2020-05-17 VITALS — BP 120/68 | HR 63 | Temp 97.7°F | Ht 61.0 in | Wt 137.6 lb

## 2020-05-17 DIAGNOSIS — H3410 Central retinal artery occlusion, unspecified eye: Secondary | ICD-10-CM | POA: Diagnosis not present

## 2020-05-17 NOTE — Progress Notes (Signed)
Metrics: Intervention Frequency ACO  Documented Smoking Status Yearly  Screened one or more times in 24 months  Cessation Counseling or  Active cessation medication Past 24 months  Past 24 months   Guideline developer: UpToDate (See UpToDate for funding source) Date Released: 2014       Wellness Office Visit  Subjective:  Patient ID: Wendy Mccann, female    DOB: Jul 17, 1945  Age: 74 y.o. MRN: [id number]  CC: Central artery occlusion. HPI  This patient was sent here from the ophthalmologist because of abnormal findings on retinal examination.  He appears that the patient might have central artery occlusion and work-up is going to be underway for stroke.  Maralyn Sago has already made referrals to neurology, cardiology, carotid ultrasound as well as echocardiogram. In addition the blood work will need to be done for his giant cell arteritis which is a possible etiology of this. The patient has no specific complaints right now.  She denies any headache. Past Medical History:  Diagnosis Date  . Alzheimer's dementia (HCC)   . Anemia   . Medical history non-contributory   . Rotator cuff (capsule) sprain    Past Surgical History:  Procedure Laterality Date  . ankles Bilateral   . CATARACT EXTRACTION W/PHACO Left 04/25/2020   Procedure: CATARACT EXTRACTION PHACO AND INTRAOCULAR LENS PLACEMENT LEFT EYE;  Surgeon: Fabio Pierce, MD;  Location: [facility];  Service: Ophthalmology;  Laterality: Left;  CDE:6.84  . CATARACT EXTRACTION W/PHACO Right 05/09/2020   Procedure: CATARACT EXTRACTION PHACO AND INTRAOCULAR LENS PLACEMENT (IOC);  Surgeon: Fabio Pierce, MD;  Location: [facility];  Service: Ophthalmology;  Laterality: Right;  CDE: 6.55      Family History  Problem Relation Age of Onset  . Gout Father   . Stroke Father   . Heart attack Father   . Dementia Mother     Social History   Social History Narrative   Lives alone.   Right-handed.   No daily caffeine use.   Social History    Tobacco Use  . Smoking status: Never Smoker  . Smokeless tobacco: Never Used  Substance Use Topics  . Alcohol use: No    Current Meds  Medication Sig  . acetaminophen (TYLENOL) 500 MG tablet Take 500 mg by mouth every 6 (six) hours as needed for mild pain.  [name] donepezil (ARICEPT) 10 MG tablet Take 1 tablet (10 mg total) by mouth at bedtime. Please call [phone] to schedule appt.  . ferrous sulfate 325 (65 FE) MG tablet Take 1 tablet (325 mg total) by mouth daily with breakfast. (Patient taking differently: Take 325 mg by mouth once a week. )  . hydroxychloroquine (PLAQUENIL) 200 MG tablet Take 1 tablet 200 mg BID Monday-Friday (Patient taking differently: Take 200 mg by mouth See admin instructions. Take 200 mg twice a day  Monday-Friday)  . latanoprost (XALATAN) 0.005 % ophthalmic solution 1 drop at bedtime.  . memantine (NAMENDA) 10 MG tablet Take 1 tablet (10 mg total) by mouth 2 (two) times daily. Please call [phone] to schedule appt.  . moxifloxacin (VIGAMOX) 0.5 % ophthalmic solution SMARTSIG:1 In Eye(s) 3 Times Daily  . UNABLE TO FIND Med Name: OTC stool softners as needed. "exlax"      Depression screen [facility] 2/9 10/20/2019 04/01/2019 02/24/2019  Decreased Interest 0 0 0  Down, Depressed, Hopeless 0 0 0  PHQ - 2 Score 0 0 0     Objective:   Today's Vitals: BP 120/68   Pulse 63  Temp 97.7 F (36.5 C) (Temporal)   Ht 5\' 1"  (1.549 m)   Wt 137 lb 9.6 oz (62.4 kg)   SpO2 93%   BMI 26.00 kg/m  Vitals with BMI 05/17/2020 05/09/2020 05/09/2020  Height 5\' 1"  - -  Weight 137 lbs 10 oz - -  BMI 26.01 - -  Systolic 120 155 05/11/2020  Diastolic 68 64 58  Pulse 63 58 49  Some encounter information is confidential and restricted. Go to Review Flowsheets activity to see all data.     Physical Exam  She appears to be alert and orientated.     Assessment   1. Central retinal artery occlusion, unspecified laterality       Tests ordered Orders Placed This Encounter   Procedures  . C-reactive protein  . Sedimentation rate  . CBC     Plan: 1. Blood work is ordered for possible giant cell arteritis and if the sed rate is elevated, I will initiate high-dose steroids.  In the meantime, we will go ahead with the other investigations and referrals already initiated by Sarah.   No orders of the defined types were placed in this encounter.   , MD

## 2020-05-18 LAB — CBC
HCT: 40.9 % (ref 35.0–45.0)
Hemoglobin: 13.7 g/dL (ref 11.7–15.5)
MCH: 32.6 pg (ref 27.0–33.0)
MCHC: 33.5 g/dL (ref 32.0–36.0)
MCV: 97.4 fL (ref 80.0–100.0)
MPV: 11.5 fL (ref 7.5–12.5)
Platelets: 270 10*3/uL (ref 140–400)
RBC: 4.2 10*6/uL (ref 3.80–5.10)
RDW: 11.4 % (ref 11.0–15.0)
WBC: 5.3 10*3/uL (ref 3.8–10.8)

## 2020-05-18 LAB — C-REACTIVE PROTEIN: CRP: 6 mg/L (ref <8.0)

## 2020-05-18 LAB — SEDIMENTATION RATE: Sed Rate: 19 mm/h (ref 0–30)

## 2020-05-18 NOTE — Progress Notes (Signed)
Call the patient or the patient's brother and let them know that all the blood work is normal.  We will await all the other test that have been ordered by Maralyn Sago.

## 2020-05-19 ENCOUNTER — Encounter: Payer: Self-pay | Admitting: Neurology

## 2020-05-19 ENCOUNTER — Ambulatory Visit: Payer: Medicare HMO | Admitting: Neurology

## 2020-05-19 VITALS — BP 112/68 | HR 60 | Ht 61.0 in | Wt 139.0 lb

## 2020-05-19 DIAGNOSIS — H3411 Central retinal artery occlusion, right eye: Secondary | ICD-10-CM | POA: Diagnosis not present

## 2020-05-19 DIAGNOSIS — F0390 Unspecified dementia without behavioral disturbance: Secondary | ICD-10-CM | POA: Diagnosis not present

## 2020-05-19 DIAGNOSIS — H5462 Unqualified visual loss, left eye, normal vision right eye: Secondary | ICD-10-CM | POA: Insufficient documentation

## 2020-05-19 NOTE — Progress Notes (Signed)
Chief Complaint  Patient presents with  . Consult    She is here today with her brother, Mariann Laster. Referred for central retinal artery occlusion recently discovered on abnormal retinal exam. She has also been sent for a cardiology evaluation. She last saw Judson Roch for dementia with MMSE score of 16/30.  [name] PCP    Ailene Ards, NP    HISTORICAL  CHANIYAH Mccann is a 74 year old female, seen in request by her primary care nurse practitioner gray, Rande Brunt, for evaluation of central retinal artery occlusion, abnormal retinal examination, and history of dementia  I reviewed and summarized the referring note.past medical history of anemia, she has past medical history of autoimmune disease, polyarthralgia, is under the care of rheumatologist Dr. Estanislado Pandy  She was hospitalized in July 2020 for right wrist swelling, hallucinations, prior to that, she worked at the hospital nutritional service, still work part-time job.   she has never driven in her life, graduated from high school. She lives alone since her husband passed away years ago, has no children, there was strong family history of dementia,  parents both suffered dementia.  She is 1 of 17 children of her parents.  Currently her brother and her sister are involved in her care.  She sleeps well, no hallucinations, has good appetite.  She started to have memory loss around 2019, tends to repeat herself become more obvious since most recent hospital admission, Mini-Mental Status Examination 19 out of 30,  Laboratory evaluation showed positive rheumatoid factor, ANA 1-320, nucleolar, 1:1280 nuclear dot., sed rate 90, cyclic citrul peptide antibody IgG  RPR was negative, TSH was normal 2.3, B12 was 189, ferritin 214, iron was only 10, saturation rate was 4  I personally reviewed CT head without contrast, supratentorium small vessel disease  UPDATE May 19 2020: She is accompanied by her brother at today's clinical visit, is referred by  her primary care physician for evaluation of right retinal artery occlusion.  She had left cataract surgery on April 25, 2020 by ophthalmologist Dr. Baruch Goldmann, MD, recovered well, had right eye cataract surgery with intraocular lens placement on May 09, 2020, postsurgically, she continue have significant decreased vision, postsurgical examination, was found to have right central retinal artery occlusion  Patient was seen by our clinic for dementia in the past, poor historian, she could not tell how long she had right visual loss, previously never checked the monocular vision  Laboratory evaluation on May 16, 2020 showed normal ESR, C-reactive protein, ferritin, B12 502, CBC  In 2020 showed positive ANA, SSA more than 8.0, normal double-stranded DNA, normal C3, mild elevation of C4,  Laboratory evaluation ESR C-reactive protein within normal limit CBC with differentiation, thyroid functional test, ferritin 202, vitamin B12 502,    I personally reviewed MRI of brain April 2021, mild generalized atrophy supratentorium small vessel disease no acute abnormality  Her brother visited her daily,  REVIEW OF SYSTEMS: Full 14 system review of systems performed and notable only for as above All other review of systems were negative.  ALLERGIES: No Known Allergies  HOME MEDICATIONS: Current Outpatient Medications  Medication Sig Dispense Refill  . acetaminophen (TYLENOL) 500 MG tablet Take 500 mg by mouth every 6 (six) hours as needed for mild pain.    [name] donepezil (ARICEPT) 10 MG tablet Take 1 tablet (10 mg total) by mouth at bedtime. Please call [phone] to schedule appt. 90 tablet 0  . ferrous sulfate 325 (65 FE) MG tablet Take 1  tablet (325 mg total) by mouth daily with breakfast. (Patient taking differently: Take 325 mg by mouth once a week. ) 30 tablet 0  . hydroxychloroquine (PLAQUENIL) 200 MG tablet Take 1 tablet 200 mg BID Monday-Friday (Patient taking differently: Take 200 mg  by mouth See admin instructions. Take 200 mg twice a day  Monday-Friday) 120 tablet 0  . latanoprost (XALATAN) 0.005 % ophthalmic solution Place 1 drop into both eyes at bedtime.     . memantine (NAMENDA) 10 MG tablet Take 1 tablet (10 mg total) by mouth 2 (two) times daily. Please call [phone] to schedule appt. 60 tablet 3  . moxifloxacin (VIGAMOX) 0.5 % ophthalmic solution Place 1 drop into the right eye 3 (three) times daily.     . timolol (BETIMOL) 0.25 % ophthalmic solution Place 1 drop into both eyes 2 (two) times daily.    [name] UNABLE TO FIND Med Name: OTC stool softners as needed. "exlax"     No current facility-administered medications for this visit.    PAST MEDICAL HISTORY: Past Medical History:  Diagnosis Date  . Alzheimer's dementia ([place])   . Anemia   . Cataract    bilateral  . Medical history non-contributory   . Rotator cuff (capsule) sprain     PAST SURGICAL HISTORY: Past Surgical History:  Procedure Laterality Date  . ankles Bilateral   . CATARACT EXTRACTION W/PHACO Left 04/25/2020   Procedure: CATARACT EXTRACTION PHACO AND INTRAOCULAR LENS PLACEMENT LEFT EYE;  Surgeon: Baruch Goldmann, MD;  Location: [facility];  Service: Ophthalmology;  Laterality: Left;  CDE:6.84  . CATARACT EXTRACTION W/PHACO Right 05/09/2020   Procedure: CATARACT EXTRACTION PHACO AND INTRAOCULAR LENS PLACEMENT (IOC);  Surgeon: Baruch Goldmann, MD;  Location: [facility];  Service: Ophthalmology;  Laterality: Right;  CDE: 6.55     FAMILY HISTORY: Family History  Problem Relation Age of Onset  . Gout Father   . Stroke Father   . Heart attack Father   . Dementia Mother     SOCIAL HISTORY: Social History   Socioeconomic History  . Marital status: Single    Spouse name: Not on file  . Number of children: 0  . Years of education: 68  . Highest education level: High school graduate  Occupational History  . Occupation: Retired  Tobacco Use  . Smoking status: Never Smoker  . Smokeless tobacco:  Never Used  Vaping Use  . Vaping Use: Never used  Substance and Sexual Activity  . Alcohol use: No  . Drug use: No  . Sexual activity: Not on file  Other Topics Concern  . Not on file  Social History Narrative   Lives alone.   Right-handed.   No daily caffeine use.   Social Determinants of Health   Financial Resource Strain:   . Difficulty of Paying Living Expenses: Not on file  Food Insecurity:   . Worried About Charity fundraiser in the Last Year: Not on file  . Ran Out of Food in the Last Year: Not on file  Transportation Needs:   . Lack of Transportation (Medical): Not on file  . Lack of Transportation (Non-Medical): Not on file  Physical Activity:   . Days of Exercise per Week: Not on file  . Minutes of Exercise per Session: Not on file  Stress:   . Feeling of Stress : Not on file  Social Connections:   . Frequency of Communication with Friends and Family: Not on file  . Frequency of Social Gatherings with  Friends and Family: Not on file  . Attends Religious Services: Not on file  . Active Member of Clubs or Organizations: Not on file  . Attends Archivist Meetings: Not on file  . Marital Status: Not on file  Intimate Partner Violence:   . Fear of Current or Ex-Partner: Not on file  . Emotionally Abused: Not on file  . Physically Abused: Not on file  . Sexually Abused: Not on file     PHYSICAL EXAM   Vitals:   05/19/20 1122  BP: 112/68  Pulse: 60  Weight: 139 lb (63 kg)  Height: _0  (1.549 m)   Not recorded     Body mass index is 26.26 kg/m.  PHYSICAL EXAMNIATION:  Gen: NAD, conversant, well nourised, well groomed                     Cardiovascular: Regular rate rhythm, no peripheral edema, warm, nontender. Eyes: Conjunctivae clear without exudates or hemorrhage Neck: Supple, no carotid bruits. Pulmonary: Clear to auscultation bilaterally   NEUROLOGICAL EXAM:  MENTAL STATUS: MMSE - Mini Mental State Exam 10/07/2019 04/28/2019  02/24/2019  Not completed: - - Unable to complete  Orientation to time 3 3 0  Orientation to Place 3 4 0  Registration 3 3 0  Attention/ Calculation 0 0 0  Recall 0 0 3  Language- name 2 objects _1 Language- repeat _2 Language- follow 3 step command 3 3 0  Language- read & follow direction _3 Write a sentence 0 1 0  Copy design 0 1 1  Total score _4 CRANIAL NERVES: CN II: Status post bilateral postsurgical pupillary change, left eye can read small print, right eye cannot do finger count CN III, IV, VI: extraocular movement are normal. No ptosis. CN V: Facial sensation is intact to light touch CN VII: Face is symmetric with normal eye closure  CN VIII: Hearing is normal to causal conversation. CN IX, X: Phonation is normal. CN XI: Head turning and shoulder shrug are intact  MOTOR: There is no pronator drift of out-stretched arms. Muscle bulk and tone are normal. Muscle strength is normal.  REFLEXES: Reflexes are 2+ and symmetric at the biceps, triceps, knees, and ankles. Plantar responses are flexor.  SENSORY: Intact to light touch, pinprick and vibratory sensation are intact in fingers and toes.  COORDINATION: There is no trunk or limb dysmetria noted.  GAIT/STANCE: Posture is normal. Gait is steady   DIAGNOSTIC DATA (LABS, IMAGING, TESTING) - I reviewed patient records, labs, notes, testing and imaging myself where available.   ASSESSMENT AND PLAN  BHAVIKA SCHNIDER is a 74 y.o. female   Dementia  Central nervous system degenerative disorder  Is on Aricept 10 mg daily, Namenda 10 mg twice a day  Right central retinal artery occlusion  No evidence of temporal arteritis  She does has vascular risk factor aging, autoimmune disease, polyarthralgia,, taking Plaquenil,  MRI of the brain showed mild generalized atrophy mild supratentorium small vessel disease  Complete evaluation with echocardiogram, ultrasound of carotid artery  Primary care  physician also referred her for Holter monitoring,  Suggested aspirin 81 mg daily     Marcial Pacas, M.D. Ph.D.  [facility] [address] [place], [place] [address] Ph: [phone] Fax: [phone]  CC:  Ailene Ards, NP [address] [place],  [place] [address]

## 2020-05-22 ENCOUNTER — Encounter: Payer: Self-pay | Admitting: Cardiology

## 2020-05-22 NOTE — Progress Notes (Signed)
Cardiology Office Note  Date: 05/23/2020   ID: Jarrod, Bodkins [birth year], MRN [id number]  PCP:  Elenore Paddy, NP  Cardiologist:  Nona Dell, MD Electrophysiologist:  None   Chief Complaint  Patient presents with  . History of central retinal artery occlusion    History of Present Illness: Wendy Mccann is a 74 y.o. female referred for cardiology consultation by Ms. Wallace Cullens NP as part of work-up for right central retinal artery occlusion noted by ophthalmologist.  She is here today with her younger brother.  She was seen by Neurology recently on November 4, I reviewed the note.  She was not felt to have evidence of temporal arteritis.  Repeat brain MRI ordered, last study in April of this year reported age-appropriate chronic small vessel disease and generalized atrophy.  Carotid Dopplers and echocardiogram have also been scheduled but not yet performed.  She has not had a cardiac monitor. She has no known history of cardiac arrhythmia.  She reports no sense of palpitations, no unusual breathlessness with typical ADLs.  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  I reviewed her medications which are outlined below.  She was recently started on aspirin given above diagnosis.  Past Medical History:  Diagnosis Date  . Alzheimer's dementia (HCC)   . Anemia   . Cataract    Bilateral  . Central retinal artery occlusion   . Rotator cuff (capsule) sprain     Past Surgical History:  Procedure Laterality Date  . ANKLE SURGERY Bilateral   . CATARACT EXTRACTION W/PHACO Left 04/25/2020   Procedure: CATARACT EXTRACTION PHACO AND INTRAOCULAR LENS PLACEMENT LEFT EYE;  Surgeon: Fabio Pierce, MD;  Location: [facility];  Service: Ophthalmology;  Laterality: Left;  CDE:6.84  . CATARACT EXTRACTION W/PHACO Right 05/09/2020   Procedure: CATARACT EXTRACTION PHACO AND INTRAOCULAR LENS PLACEMENT (IOC);  Surgeon: Fabio Pierce, MD;  Location: [facility];  Service:  Ophthalmology;  Laterality: Right;  CDE: 6.55     Current Outpatient Medications  Medication Sig Dispense Refill  . acetaminophen (TYLENOL) 500 MG tablet Take 500 mg by mouth every 6 (six) hours as needed for mild pain.    [name] aspirin EC 81 MG tablet Take 81 mg by mouth daily. Swallow whole.    . donepezil (ARICEPT) 10 MG tablet Take 1 tablet (10 mg total) by mouth at bedtime. Please call [phone] to schedule appt. 90 tablet 0  . ferrous sulfate 325 (65 FE) MG tablet Take 1 tablet (325 mg total) by mouth daily with breakfast. (Patient taking differently: Take 325 mg by mouth once a week. ) 30 tablet 0  . hydroxychloroquine (PLAQUENIL) 200 MG tablet Take 1 tablet 200 mg BID Monday-Friday (Patient taking differently: Take 200 mg by mouth See admin instructions. Take 200 mg twice a day  Monday-Friday) 120 tablet 0  . latanoprost (XALATAN) 0.005 % ophthalmic solution Place 1 drop into both eyes at bedtime.     . memantine (NAMENDA) 10 MG tablet Take 1 tablet (10 mg total) by mouth 2 (two) times daily. Please call [phone] to schedule appt. 60 tablet 3  . moxifloxacin (VIGAMOX) 0.5 % ophthalmic solution Place 1 drop into the right eye 3 (three) times daily.     . timolol (BETIMOL) 0.25 % ophthalmic solution Place 1 drop into both eyes 2 (two) times daily.    [name] UNABLE TO FIND Med Name: OTC stool softners as needed. "exlax"     No current  facility-administered medications for this visit.   Allergies:  Patient has no known allergies.   Social History: The patient  reports that she has never smoked. She has never used smokeless tobacco. She reports that she does not drink alcohol and does not use drugs.   Family History: The patient's family history includes Dementia in her mother; Gout in her father; Heart attack in her father; Stroke in her father.   ROS: No palpitations, dizziness, or syncope.  Physical Exam: VS:  BP (!) 108/54 (BP Location: Left Arm)   Pulse 64   Ht 5\' 1"  (1.549 m)   Wt 138  lb 6.4 oz (62.8 kg)   SpO2 98%   BMI 26.15 kg/m , BMI Body mass index is 26.15 kg/m.  Wt Readings from Last 3 Encounters:  05/23/20 138 lb 6.4 oz (62.8 kg)  05/19/20 139 lb (63 kg)  05/17/20 137 lb 9.6 oz (62.4 kg)    General: Patient appears comfortable at rest. HEENT: Conjunctiva and lids normal, wearing a mask. Neck: Supple, no elevated JVP or carotid bruits, no thyromegaly. Lungs: Clear to auscultation, nonlabored breathing at rest. Cardiac: Regular rate and rhythm, no S3 or significant systolic murmur, no pericardial rub. Extremities: No pitting edema, distal pulses 2+. Skin: Warm and dry. Musculoskeletal: No kyphosis. Neuropsychiatric: Alert and oriented x3, affect grossly appropriate.  ECG:  An ECG dated 02/05/2019 was personally reviewed today and demonstrated:  Normal sinus rhythm, small R' in lead V1.  Recent Labwork: 02/04/2020: ALT 13; AST 19; BUN 18; Creat 1.06; Potassium 4.1; Sodium 139; TSH 2.26 05/17/2020: Hemoglobin 13.7; Platelets 270   Other Studies Reviewed Today:  No prior cardiac testing for review today.  Assessment and Plan:  History of right central retinal artery occlusion noted by ophthalmologist.  She had Neurology consultation recently, I reviewed the note.  Agree with plans for echocardiogram and carotid Dopplers, we will try to get these arranged at [facility] since she lives in [place].  We will also place a 14-day Zio patch to screen for any atrial arrhythmias, although she is asymptomatic in terms of any palpitations.  Would continue aspirin for now.   Medication Adjustments/Labs and Tests Ordered: Current medicines are reviewed at length with the patient today.  Concerns regarding medicines are outlined above.   Tests Ordered: Orders Placed This Encounter  Procedures  . [place] Carotid Duplex Bilateral  . LONG TERM MONITOR-LIVE TELEMETRY (3-14 DAYS)  . ECHOCARDIOGRAM COMPLETE    Medication Changes: No orders of the defined types were  placed in this encounter.   Disposition:  Follow up test results.  Signed, [place], MD, [facility] 05/23/2020 2:05 PM    [facility] [address] [place], [place] Phone: [phone]; Fax: [phone]

## 2020-05-23 ENCOUNTER — Other Ambulatory Visit: Payer: Self-pay

## 2020-05-23 ENCOUNTER — Encounter: Payer: Self-pay | Admitting: Cardiology

## 2020-05-23 ENCOUNTER — Ambulatory Visit: Payer: Medicare HMO | Admitting: Cardiology

## 2020-05-23 VITALS — BP 108/54 | HR 64 | Ht 61.0 in | Wt 138.4 lb

## 2020-05-23 DIAGNOSIS — H3411 Central retinal artery occlusion, right eye: Secondary | ICD-10-CM | POA: Diagnosis not present

## 2020-05-23 DIAGNOSIS — Z9189 Other specified personal risk factors, not elsewhere classified: Secondary | ICD-10-CM

## 2020-05-23 NOTE — Patient Instructions (Signed)
Medication Instructions:  Your physician recommends that you continue on your current medications as directed. Please refer to the Current Medication list given to you today.  *If you need a refill on your cardiac medications before your next appointment, please call your pharmacy*   Lab Work: None today If you have labs (blood work) drawn today and your tests are completely normal, you will receive your results only by: [name] MyChart Message (if you have MyChart) OR . A paper copy in the mail If you have any lab test that is abnormal or we need to change your treatment, we will call you to review the results.   Testing/Procedures: Your physician has requested that you have an echocardiogram. Echocardiography is a painless test that uses sound waves to create images of your heart. It provides your doctor with information about the size and shape of your heart and how well your heart's chambers and valves are working. This procedure takes approximately one hour. There are no restrictions for this procedure.  Your physician has requested that you have a carotid duplex. This test is an ultrasound of the carotid arteries in your neck. It looks at blood flow through these arteries that supply the brain with blood. Allow one hour for this exam. There are no restrictions or special instructions.  Your physician has recommended that you wear an event monitor (14 day Zio ) . Event monitors are medical devices that record the heart's electrical activity. Doctors most often [place] these monitors to diagnose arrhythmias. Arrhythmias are problems with the speed or rhythm of the heartbeat. The monitor is a small, portable device. You can wear one while you do your normal daily activities. This is usually used to diagnose what is causing palpitations/syncope (passing out).    Follow-Up: At [facility], you and your health needs are our priority.  As part of our continuing mission to provide you with exceptional  heart care, we have created designated Provider Care Teams.  These Care Teams include your primary Cardiologist (physician) and Advanced Practice Providers (APPs -  Physician Assistants and Nurse Practitioners) who all work together to provide you with the care you need, when you need it.  We recommend signing up for the patient portal called "MyChart".  Sign up information is provided on this After Visit Summary.  MyChart is used to connect with patients for Virtual Visits (Telemedicine).  Patients are able to view lab/test results, encounter notes, upcoming appointments, etc.  Non-urgent messages can be sent to your provider as well.   To learn more about what you can do with MyChart, go to [email or web address].    Your next appointment:  We will call you with results.      Thank you for choosing [facility] !

## 2020-05-25 NOTE — Progress Notes (Signed)
All orders/ referral have been sent in proficient. Appt dates/times are TBA.

## 2020-05-30 ENCOUNTER — Ambulatory Visit (HOSPITAL_BASED_OUTPATIENT_CLINIC_OR_DEPARTMENT_OTHER)
Admission: RE | Admit: 2020-05-30 | Discharge: 2020-05-30 | Disposition: A | Discharge status: Home / Self Care | Payer: Medicare HMO | Source: Ambulatory Visit | Attending: Cardiology | Admitting: Cardiology

## 2020-05-30 ENCOUNTER — Other Ambulatory Visit: Payer: Self-pay

## 2020-05-30 ENCOUNTER — Ambulatory Visit (HOSPITAL_COMMUNITY)
Admission: RE | Admit: 2020-05-30 | Discharge: 2020-05-30 | Disposition: A | Discharge status: Home / Self Care | Payer: Medicare HMO | Source: Ambulatory Visit | Attending: Cardiology | Admitting: Cardiology

## 2020-05-30 DIAGNOSIS — H3411 Central retinal artery occlusion, right eye: Secondary | ICD-10-CM

## 2020-05-30 DIAGNOSIS — I6523 Occlusion and stenosis of bilateral carotid arteries: Secondary | ICD-10-CM | POA: Diagnosis not present

## 2020-05-30 LAB — ECHOCARDIOGRAM COMPLETE
AR max vel: 0.93 cm2
AV Area VTI: 1.02 cm2
AV Area mean vel: 1.05 cm2
AV Mean grad: 8 mmHg
AV Peak grad: 17.5 mmHg
Ao pk vel: 2.09 m/s
Area-P 1/2: 2.64 cm2
S' Lateral: 2.3 cm

## 2020-05-30 NOTE — Progress Notes (Signed)
*  PRELIMINARY RESULTS* Echocardiogram 2D Echocardiogram has been performed.  Stacey Drain 05/30/2020, 1:51 PM

## 2020-06-01 ENCOUNTER — Other Ambulatory Visit: Payer: Self-pay | Admitting: Neurology

## 2020-06-15 DIAGNOSIS — I499 Cardiac arrhythmia, unspecified: Secondary | ICD-10-CM | POA: Diagnosis not present

## 2020-06-16 ENCOUNTER — Other Ambulatory Visit (INDEPENDENT_AMBULATORY_CARE_PROVIDER_SITE_OTHER): Payer: Medicare HMO

## 2020-06-16 ENCOUNTER — Other Ambulatory Visit: Payer: Self-pay

## 2020-06-16 DIAGNOSIS — H3411 Central retinal artery occlusion, right eye: Secondary | ICD-10-CM | POA: Diagnosis not present

## 2020-06-16 DIAGNOSIS — Z9189 Other specified personal risk factors, not elsewhere classified: Secondary | ICD-10-CM

## 2020-06-16 DIAGNOSIS — I499 Cardiac arrhythmia, unspecified: Secondary | ICD-10-CM | POA: Diagnosis not present

## 2020-06-16 NOTE — Addendum Note (Signed)
Addended by: Kerney Elbe on: 06/16/2020 10:37 AM   Modules accepted: Orders

## 2020-07-13 DIAGNOSIS — F0390 Unspecified dementia without behavioral disturbance: Secondary | ICD-10-CM | POA: Diagnosis not present

## 2020-07-13 DIAGNOSIS — M1990 Unspecified osteoarthritis, unspecified site: Secondary | ICD-10-CM | POA: Diagnosis not present

## 2020-07-13 DIAGNOSIS — R768 Other specified abnormal immunological findings in serum: Secondary | ICD-10-CM | POA: Diagnosis not present

## 2020-07-13 DIAGNOSIS — M0579 Rheumatoid arthritis with rheumatoid factor of multiple sites without organ or systems involvement: Secondary | ICD-10-CM | POA: Diagnosis not present

## 2020-07-13 DIAGNOSIS — M2550 Pain in unspecified joint: Secondary | ICD-10-CM | POA: Diagnosis not present

## 2020-07-20 ENCOUNTER — Ambulatory Visit (INDEPENDENT_AMBULATORY_CARE_PROVIDER_SITE_OTHER): Payer: Medicare HMO | Admitting: Internal Medicine

## 2020-07-20 ENCOUNTER — Other Ambulatory Visit: Payer: Self-pay

## 2020-07-20 ENCOUNTER — Encounter (INDEPENDENT_AMBULATORY_CARE_PROVIDER_SITE_OTHER): Payer: Self-pay | Admitting: Internal Medicine

## 2020-07-20 VITALS — BP 110/64 | HR 55 | Temp 97.9°F | Ht 61.0 in | Wt 135.6 lb

## 2020-07-20 DIAGNOSIS — R0782 Intercostal pain: Secondary | ICD-10-CM | POA: Diagnosis not present

## 2020-07-20 NOTE — Progress Notes (Signed)
Metrics: Intervention Frequency ACO  Documented Smoking Status Yearly  Screened one or more times in 24 months  Cessation Counseling or  Active cessation medication Past 24 months  Past 24 months   Guideline developer: UpToDate (See UpToDate for funding source) Date Released: 2014       Wellness Office Visit  Subjective:  Patient ID: Wendy Mccann, female    DOB: 05-Oct-1945  Age: 75 y.o. MRN: [id number]  CC: Chest pain HPI  This lady had an accidental fall 3 to 4 days ago in her home in the backyard.  She missed a step and fell forward and injured the right lower chest anteriorly.  She has had pain since this time.  Tylenol has not helped her.  However, she denies any dyspnea or cough or fever.  The pain is sharp and pleuritic in nature. Past Medical History:  Diagnosis Date  . Alzheimer's dementia (HCC)   . Anemia   . Cataract    Bilateral  . Central retinal artery occlusion   . Rotator cuff (capsule) sprain    Past Surgical History:  Procedure Laterality Date  . ANKLE SURGERY Bilateral   . CATARACT EXTRACTION W/PHACO Left 04/25/2020   Procedure: CATARACT EXTRACTION PHACO AND INTRAOCULAR LENS PLACEMENT LEFT EYE;  Surgeon: Fabio Pierce, MD;  Location: [facility];  Service: Ophthalmology;  Laterality: Left;  CDE:6.84  . CATARACT EXTRACTION W/PHACO Right 05/09/2020   Procedure: CATARACT EXTRACTION PHACO AND INTRAOCULAR LENS PLACEMENT (IOC);  Surgeon: Fabio Pierce, MD;  Location: [facility];  Service: Ophthalmology;  Laterality: Right;  CDE: 6.55      Family History  Problem Relation Age of Onset  . Gout Father   . Stroke Father   . Heart attack Father   . Dementia Mother     Social History   Social History Narrative   Lives alone.   Right-handed.   No daily caffeine use.   Social History   Tobacco Use  . Smoking status: Never Smoker  . Smokeless tobacco: Never Used  Substance Use Topics  . Alcohol use: No    Current Meds  Medication Sig  . acetaminophen  (TYLENOL) 500 MG tablet Take 500 mg by mouth every 6 (six) hours as needed for mild pain.  [name] aspirin EC 81 MG tablet Take 81 mg by mouth daily. Swallow whole.  . donepezil (ARICEPT) 10 MG tablet TAKE 1 TABLET BY MOUTH AT BEDTIME.  . ferrous sulfate 325 (65 FE) MG tablet Take 1 tablet (325 mg total) by mouth daily with breakfast. (Patient taking differently: Take 325 mg by mouth once a week.)  . hydroxychloroquine (PLAQUENIL) 200 MG tablet Take 1 tablet 200 mg BID Monday-Friday (Patient taking differently: Take 200 mg by mouth See admin instructions. Take 200 mg twice a day  Monday-Friday)  . memantine (NAMENDA) 10 MG tablet Take 1 tablet (10 mg total) by mouth 2 (two) times daily. Please call [phone] to schedule appt.  . polyethylene glycol (MIRALAX / GLYCOLAX) 17 g packet 1 packet mixed with 8 ounces of fluid  . timolol (TIMOPTIC) 0.5 % ophthalmic solution SMARTSIG:1 Drop(s) In Eye(s) Every 12 Hours  . UNABLE TO FIND Med Name: OTC stool softners as needed. "exlax"      Depression screen [facility] 2/9 10/20/2019 04/01/2019 02/24/2019  Decreased Interest 0 0 0  Down, Depressed, Hopeless 0 0 0  PHQ - 2 Score 0 0 0     Objective:   Today's Vitals: BP 110/64   Pulse (!) 55  Temp 97.9 F (36.6 C) (Temporal)   Ht 5\' 1"  (1.549 m)   Wt 135 lb 9.6 oz (61.5 kg)   SpO2 93%   BMI 25.62 kg/m  Vitals with BMI 07/20/2020 05/23/2020 05/23/2020  Height 5\' 1"  - 5\' 1"   Weight 135 lbs 10 oz - 138 lbs 6 oz  BMI 25.63 - 26.16  Systolic 110 108 13/02/2020  Diastolic 64 54 58  Pulse 55 - 64  Some encounter information is confidential and restricted. Go to Review Flowsheets activity to see all data.     Physical Exam  On physical exam, she has acute tenderness in the right lower anterior chest wall, either cartilage, muscular or bone pain.     Assessment   1. Intercostal pain       Tests ordered No orders of the defined types were placed in this encounter.    Plan: 1. I recommended that she try  ibuprofen over-the-counter dose of 600 mg 3 times a day with food for no longer than 5 days.  If she does not improve after this, she will let know.  I have told her that we could x-ray the area but rib fracture would not change the management.   No orders of the defined types were placed in this encounter.   , MD

## 2020-07-21 ENCOUNTER — Institutional Professional Consult (permissible substitution): Payer: Medicare Other | Admitting: Neurology

## 2020-08-18 ENCOUNTER — Other Ambulatory Visit: Payer: Self-pay | Admitting: Neurology

## 2020-08-22 DIAGNOSIS — H401113 Primary open-angle glaucoma, right eye, severe stage: Secondary | ICD-10-CM | POA: Diagnosis not present

## 2020-08-22 DIAGNOSIS — H3411 Central retinal artery occlusion, right eye: Secondary | ICD-10-CM | POA: Diagnosis not present

## 2020-08-22 DIAGNOSIS — H401122 Primary open-angle glaucoma, left eye, moderate stage: Secondary | ICD-10-CM | POA: Diagnosis not present

## 2020-08-23 ENCOUNTER — Encounter: Payer: Self-pay | Admitting: Neurology

## 2020-08-23 ENCOUNTER — Ambulatory Visit: Payer: Medicare HMO | Admitting: Neurology

## 2020-08-23 ENCOUNTER — Other Ambulatory Visit: Payer: Self-pay

## 2020-08-23 VITALS — BP 116/85 | HR 62 | Ht 61.0 in | Wt 130.0 lb

## 2020-08-23 DIAGNOSIS — F0390 Unspecified dementia without behavioral disturbance: Secondary | ICD-10-CM | POA: Diagnosis not present

## 2020-08-23 DIAGNOSIS — I951 Orthostatic hypotension: Secondary | ICD-10-CM | POA: Diagnosis not present

## 2020-08-23 MED ORDER — DONEPEZIL HCL 10 MG PO TABS: 10.0000 mg | ORAL_TABLET | Freq: Every day | ORAL | 1 refills | Status: AC

## 2020-08-23 MED ORDER — MEMANTINE HCL 10 MG PO TABS: 10.0000 mg | ORAL_TABLET | Freq: Two times a day (BID) | ORAL | 1 refills | Status: AC

## 2020-08-23 NOTE — Patient Instructions (Signed)
Continue current medications  Continue aspirin 81 mg daily Continue seeing your primary doctor, can refill medications going forward See you back as needed

## 2020-08-23 NOTE — Progress Notes (Signed)
Chief Complaint  Patient presents with  . Follow-up    Memory new rm, with brother , states she is more forgetful and anxious     HISTORICAL  Wendy Mccann is a 75 year old female, seen in request by her primary care nurse practitioner gray, Rande Brunt, for evaluation of central retinal artery occlusion, abnormal retinal examination, and history of dementia  I reviewed and summarized the referring note.past medical history of anemia, she has past medical history of autoimmune disease, polyarthralgia, is under the care of rheumatologist Dr. Estanislado Pandy  She was hospitalized in July 2020 for right wrist swelling, hallucinations, prior to that, she worked at the hospital nutritional service, still work part-time job.   she has never driven in her life, graduated from high school. She lives alone since her husband passed away years ago, has no children, there was strong family history of dementia,  parents both suffered dementia.  She is 1 of 17 children of her parents.  Currently her brother and her sister are involved in her care.  She sleeps well, no hallucinations, has good appetite.  She started to have memory loss around 2019, tends to repeat herself become more obvious since most recent hospital admission, Mini-Mental Status Examination 19 out of 30,  Laboratory evaluation showed positive rheumatoid factor, ANA 1-320, nucleolar, 1:1280 nuclear dot., sed rate 90, cyclic citrul peptide antibody IgG  RPR was negative, TSH was normal 2.3, B12 was 189, ferritin 214, iron was only 10, saturation rate was 4  I personally reviewed CT head without contrast, supratentorium small vessel disease  UPDATE May 19 2020: She is accompanied by her brother at today's clinical visit, is referred by her primary care physician for evaluation of right retinal artery occlusion.  She had left cataract surgery on April 25, 2020 by ophthalmologist Dr. Baruch Goldmann, MD, recovered well, had right eye cataract  surgery with intraocular lens placement on May 09, 2020, postsurgically, she continue have significant decreased vision, postsurgical examination, was found to have right central retinal artery occlusion  Patient was seen by our clinic for dementia in the past, poor historian, she could not tell how long she had right visual loss, previously never checked the monocular vision  Laboratory evaluation on May 16, 2020 showed normal ESR, C-reactive protein, ferritin, B12 502, CBC  In 2020 showed positive ANA, SSA more than 8.0, normal double-stranded DNA, normal C3, mild elevation of C4,  Laboratory evaluation ESR C-reactive protein within normal limit CBC with differentiation, thyroid functional test, ferritin 202, vitamin B12 502,    I personally reviewed MRI of brain April 2021, mild generalized atrophy supratentorium small vessel disease no acute abnormality  Her brother visited her daily,  Update August 23, 2020 SS: Here today with her brother, MMSE 16/30.  She remains on Aricept and Namenda.  Lives alone, but her family checks on her constantly, her younger brother, Wendy Mccann is her main caregiver.  She does her own ADLs, housework.  Does not drive.  Has a good appetite. Had 1 fall around Christmas time.  Sometimes may misplace things, get upset she can't find it.  Her brother oversees her medications.  Saw her eye doctor yesterday, reportedly got a good report, vision to the right eye has improved, getting glasses next week.  She has no complaints today.  Remains on aspirin 81 mg daily. In good mood, spirits today. She reports no visual problems, eye sight good in both eyes now.  REVIEW OF SYSTEMS: Full 14 system review  of systems performed and notable only for as above All other review of systems were negative.  ALLERGIES: No Known Allergies  HOME MEDICATIONS: Current Outpatient Medications  Medication Sig Dispense Refill  . acetaminophen (TYLENOL) 500 MG tablet Take 500 mg by  mouth every 6 (six) hours as needed for mild pain.    [name] aspirin EC 81 MG tablet Take 81 mg by mouth daily. Swallow whole.    . donepezil (ARICEPT) 10 MG tablet TAKE 1 TABLET BY MOUTH AT BEDTIME. 90 tablet 0  . ferrous sulfate 325 (65 FE) MG tablet Take 1 tablet (325 mg total) by mouth daily with breakfast. (Patient taking differently: Take 325 mg by mouth once a week.) 30 tablet 0  . hydroxychloroquine (PLAQUENIL) 200 MG tablet Take 1 tablet 200 mg BID Monday-Friday (Patient taking differently: Take 200 mg by mouth See admin instructions. Take 200 mg twice a day  Monday-Friday) 120 tablet 0  . latanoprost (XALATAN) 0.005 % ophthalmic solution Place 1 drop into both eyes at bedtime.    . memantine (NAMENDA) 10 MG tablet Take 1 tablet (10 mg total) by mouth 2 (two) times daily. Please call [phone] to schedule appt. 60 tablet 3  . moxifloxacin (VIGAMOX) 0.5 % ophthalmic solution Place 1 drop into the right eye 3 (three) times daily.    . polyethylene glycol (MIRALAX / GLYCOLAX) 17 g packet 1 packet mixed with 8 ounces of fluid    . timolol (BETIMOL) 0.25 % ophthalmic solution Place 1 drop into both eyes 2 (two) times daily.    . timolol (TIMOPTIC) 0.5 % ophthalmic solution SMARTSIG:1 Drop(s) In Eye(s) Every 12 Hours    . UNABLE TO FIND Med Name: OTC stool softners as needed. "exlax"     No current facility-administered medications for this visit.    PAST MEDICAL HISTORY: Past Medical History:  Diagnosis Date  . Alzheimer's dementia ([place])   . Anemia   . Cataract    Bilateral  . Central retinal artery occlusion   . Rotator cuff (capsule) sprain     PAST SURGICAL HISTORY: Past Surgical History:  Procedure Laterality Date  . ANKLE SURGERY Bilateral   . CATARACT EXTRACTION W/PHACO Left 04/25/2020   Procedure: CATARACT EXTRACTION PHACO AND INTRAOCULAR LENS PLACEMENT LEFT EYE;  Surgeon: Baruch Goldmann, MD;  Location: [facility];  Service: Ophthalmology;  Laterality: Left;  CDE:6.84  . CATARACT  EXTRACTION W/PHACO Right 05/09/2020   Procedure: CATARACT EXTRACTION PHACO AND INTRAOCULAR LENS PLACEMENT (IOC);  Surgeon: Baruch Goldmann, MD;  Location: [facility];  Service: Ophthalmology;  Laterality: Right;  CDE: 6.55     FAMILY HISTORY: Family History  Problem Relation Age of Onset  . Gout Father   . Stroke Father   . Heart attack Father   . Dementia Mother     SOCIAL HISTORY: Social History   Socioeconomic History  . Marital status: Single    Spouse name: Not on file  . Number of children: 0  . Years of education: 59  . Highest education level: High school graduate  Occupational History  . Occupation: Retired  Tobacco Use  . Smoking status: Never Smoker  . Smokeless tobacco: Never Used  Vaping Use  . Vaping Use: Never used  Substance and Sexual Activity  . Alcohol use: No  . Drug use: No  . Sexual activity: Not on file  Other Topics Concern  . Not on file  Social History Narrative   Lives alone.   Right-handed.   No daily caffeine  use.   Social Determinants of Health   Financial Resource Strain: Not on file  Food Insecurity: Not on file  Transportation Needs: Not on file  Physical Activity: Not on file  Stress: Not on file  Social Connections: Not on file  Intimate Partner Violence: Not on file   PHYSICAL EXAM   Vitals:   08/23/20 0959  BP: 116/85  Pulse: 62  Weight: 130 lb (59 kg)  Height: 5' 1"  (1.549 m)   Not recorded     Body mass index is 24.56 kg/m.  PHYSICAL EXAMNIATION: MMSE - Mini Mental State Exam 08/23/2020 10/07/2019 04/28/2019  Not completed: - - -  Orientation to time 3 3 3   Orientation to Place 4 3 4   Registration 3 3 3   Attention/ Calculation 0 0 0  Recall 0 0 0  Language- name 2 objects 2 2 2   Language- repeat 0 1 1  Language- follow 3 step command 3 3 3   Language- read & follow direction 1 1 1   Write a sentence 0 0 1  Copy design 0 0 1  Total score 16 16 19     Gen: NAD, conversant, well nourised, well groomed                      Cardiovascular: Regular rate rhythm, no peripheral edema, warm, nontender. Pulmonary: Clear to auscultation bilaterally   NEUROLOGICAL EXAM:  MENTAL STATUS: MMSE - Mini Mental State Exam 08/23/2020 10/07/2019 04/28/2019  Not completed: - - -  Orientation to time 3 3 3   Orientation to Place 4 3 4   Registration 3 3 3   Attention/ Calculation 0 0 0  Recall 0 0 0  Language- name 2 objects 2 2 2   Language- repeat 0 1 1  Language- follow 3 step command 3 3 3   Language- read & follow direction 1 1 1   Write a sentence 0 0 1  Copy design 0 0 1  Total score 16 16 19      CRANIAL NERVES: CN II: Pupils equal, round reactive to light but sluggish, normal EOM, and finger count CN V: Facial sensation is intact to light touch CN VII: Face is symmetric with normal eye closure  CN VIII: Hearing is normal to causal conversation. CN IX, X: Phonation is normal. CN XI: Head turning and shoulder shrug are intact  MOTOR: There is no pronator drift of out-stretched arms. Muscle bulk and tone are normal. Muscle strength is normal.  REFLEXES: Reflexes are 2+ and symmetric at the biceps, triceps, knees, and ankles. Plantar responses are flexor.  SENSORY: Intact to light touch to all extremities  COORDINATION: There is no trunk or limb dysmetria noted.  GAIT/STANCE: Posture is normal. Gait is steady   DIAGNOSTIC DATA (LABS, IMAGING, TESTING) - I reviewed patient records, labs, notes, testing and imaging myself where available.   ASSESSMENT AND PLAN  Wendy Mccann is a 75 y.o. female   1. Dementia -MMSE 16/30 today -Central nervous system degenerative disorder -Is on Aricept 10 mg daily, Namenda 10 mg twice a day -Continue memory medications, refill sent for 6 months, will continue to follow with PCP, who can refill going forward  2. Right central retinal artery occlusion -No evidence of temporal arteritis -She does has vascular risk factor aging, autoimmune disease,  polyarthralgia, taking Plaquenil, -MRI of the brain showed mild generalized atrophy mild supratentorium small vessel disease -Carotid US do not suggest any obstructive atherosclerosis within internal carotid arteries -Echocardiogram showed EF 60  to 65%, aortic valve is moderately sclerotic but not stenotic not necessarily associated with central retinal artery occlusion -Cardiac heart monitor rhythm was normal, brief episodes of SVT, but not A. Fib -Has seen cardiology, Dr. Domenic Polite recommend continue aspirin continue follow-up with PCP  She will follow-up in our office on an as-needed basis, continue routine follow-up with her PCP, remain on aspirin 81 mg daily.    I spent 30 minutes of face-to-face and non-face-to-face time with patient.  This included previsit chart review, lab review, study review, order entry, electronic health record documentation, patient education.  Evangeline Dakin, DNP  [facility] [address] [place],  [address] [phone]

## 2020-08-29 DIAGNOSIS — Z961 Presence of intraocular lens: Secondary | ICD-10-CM | POA: Diagnosis not present

## 2020-08-29 DIAGNOSIS — M0560 Rheumatoid arthritis of unspecified site with involvement of other organs and systems: Secondary | ICD-10-CM | POA: Diagnosis not present

## 2020-08-29 DIAGNOSIS — H47011 Ischemic optic neuropathy, right eye: Secondary | ICD-10-CM | POA: Diagnosis not present

## 2020-08-29 DIAGNOSIS — Z79899 Other long term (current) drug therapy: Secondary | ICD-10-CM | POA: Diagnosis not present

## 2020-08-29 DIAGNOSIS — H401113 Primary open-angle glaucoma, right eye, severe stage: Secondary | ICD-10-CM | POA: Diagnosis not present

## 2020-08-29 DIAGNOSIS — H401121 Primary open-angle glaucoma, left eye, mild stage: Secondary | ICD-10-CM | POA: Diagnosis not present

## 2020-08-29 DIAGNOSIS — H524 Presbyopia: Secondary | ICD-10-CM | POA: Diagnosis not present

## 2020-08-29 DIAGNOSIS — Z0100 Encounter for examination of eyes and vision without abnormal findings: Secondary | ICD-10-CM | POA: Diagnosis not present

## 2020-08-29 DIAGNOSIS — H26493 Other secondary cataract, bilateral: Secondary | ICD-10-CM | POA: Diagnosis not present

## 2020-10-24 ENCOUNTER — Encounter (INDEPENDENT_AMBULATORY_CARE_PROVIDER_SITE_OTHER): Payer: Medicare HMO | Admitting: Nurse Practitioner

## 2020-11-02 ENCOUNTER — Ambulatory Visit (INDEPENDENT_AMBULATORY_CARE_PROVIDER_SITE_OTHER): Payer: Medicare HMO | Admitting: Nurse Practitioner

## 2020-11-02 ENCOUNTER — Telehealth (INDEPENDENT_AMBULATORY_CARE_PROVIDER_SITE_OTHER): Payer: Self-pay | Admitting: Nurse Practitioner

## 2020-11-02 ENCOUNTER — Other Ambulatory Visit: Payer: Self-pay

## 2020-11-02 ENCOUNTER — Encounter (INDEPENDENT_AMBULATORY_CARE_PROVIDER_SITE_OTHER): Payer: Self-pay | Admitting: Nurse Practitioner

## 2020-11-02 VITALS — BP 122/71 | HR 60 | Temp 97.3°F | Resp 17 | Wt 129.0 lb

## 2020-11-02 DIAGNOSIS — Z0000 Encounter for general adult medical examination without abnormal findings: Secondary | ICD-10-CM | POA: Diagnosis not present

## 2020-11-02 NOTE — Patient Instructions (Signed)
  Ms. Harnish , Thank you for taking time to come for your Medicare Wellness Visit. I appreciate your ongoing commitment to your health goals. Please review the following plan we discussed and let me know if I can assist you in the future.   These are the goals we discussed: Goals   None     This is a list of the screening recommended for you and due dates:  Health Maintenance  Topic Date Due  . Mammogram  Never done  . Tetanus Vaccine  07/16/2002  . Pneumonia vaccines (2 of 2 - PCV13) 07/29/2015  . DEXA scan (bone density measurement)  02/01/2021*  . Colon Cancer Screening  02/01/2021*  .  Hepatitis C: One time screening is recommended by Center for Disease Control  (CDC) for  adults born from [birth year] through 1964.   02/01/2021*  . Flu Shot  02/13/2021  . COVID-19 Vaccine  Completed  . HPV Vaccine  Aged Out  *Topic was postponed. The date shown is not the original due date.

## 2020-11-02 NOTE — Telephone Encounter (Signed)
Will you see if you can schedule bilateral mammogram for breast cancer screening? I see where this was ordered in July, but was never done.

## 2020-11-02 NOTE — Telephone Encounter (Signed)
Will get it set for a appt near 01/18/21 appt.

## 2020-11-02 NOTE — Progress Notes (Signed)
Subjective:   Wendy Mccann is a 75 y.o. female who presents for Medicare Annual (Subsequent) preventive examination.  Review of Systems     Cardiac Risk Factors include: advanced age (>55men, >2 women);hypertension     Objective:    Today's Vitals   11/02/20 1713  BP: 122/71  Pulse: 60  Resp: 17  Temp: (!) 97.3 F (36.3 C)  TempSrc: Temporal  SpO2: 97%  Weight: 129 lb (58.5 kg)   Body mass index is 24.37 kg/m.  Advanced Directives 11/02/2020 05/09/2020 04/25/2020 02/06/2019 02/06/2019 02/05/2019 02/05/2019  Does Patient Have a Medical Advance Directive? Yes No No No No No No  Type of Advance Directive Healthcare Power of Attorney - - - - - -  Does patient want to make changes to medical advance directive? No - Patient declined - - - - - -  Copy of Healthcare Power of Attorney in Chart? No - copy requested - - - - - -  Would patient like information on creating a medical advance directive? - No - Guardian declined No - Patient declined No - Patient declined No - Patient declined No - Guardian declined No - Guardian declined  Some encounter information is confidential and restricted. Go to Review Flowsheets activity to see all data.    Current Medications (verified) Outpatient Encounter Medications as of 11/02/2020  Medication Sig  . acetaminophen (TYLENOL) 500 MG tablet Take 500 mg by mouth every 6 (six) hours as needed for mild pain.  [name] aspirin EC 81 MG tablet Take 81 mg by mouth daily. Swallow whole.  . donepezil (ARICEPT) 10 MG tablet Take 1 tablet (10 mg total) by mouth at bedtime.  . ferrous sulfate 325 (65 FE) MG tablet Take 1 tablet (325 mg total) by mouth daily with breakfast. (Patient taking differently: Take 325 mg by mouth once a week.)  . hydroxychloroquine (PLAQUENIL) 200 MG tablet Take 1 tablet 200 mg BID Monday-Friday (Patient taking differently: Take 200 mg by mouth See admin instructions. Take 200 mg twice a day  Monday-Friday)  . latanoprost (XALATAN)  0.005 % ophthalmic solution Place 1 drop into both eyes at bedtime.  . memantine (NAMENDA) 10 MG tablet Take 1 tablet (10 mg total) by mouth 2 (two) times daily. Please call [phone] to schedule appt.  . moxifloxacin (VIGAMOX) 0.5 % ophthalmic solution Place 1 drop into the right eye 3 (three) times daily.  . polyethylene glycol (MIRALAX / GLYCOLAX) 17 g packet 1 packet mixed with 8 ounces of fluid  . timolol (BETIMOL) 0.25 % ophthalmic solution Place 1 drop into both eyes 2 (two) times daily.  . timolol (TIMOPTIC) 0.5 % ophthalmic solution SMARTSIG:1 Drop(s) In Eye(s) Every 12 Hours  . UNABLE TO FIND Med Name: OTC stool softners as needed. "exlax"   No facility-administered encounter medications on file as of 11/02/2020.    Allergies (verified) Patient has no known allergies.   History: Past Medical History:  Diagnosis Date  . Alzheimer's dementia (HCC)   . Anemia   . Cataract    Bilateral  . Central retinal artery occlusion   . Rotator cuff (capsule) sprain    Past Surgical History:  Procedure Laterality Date  . ANKLE SURGERY Bilateral   . CATARACT EXTRACTION W/PHACO Left 04/25/2020   Procedure: CATARACT EXTRACTION PHACO AND INTRAOCULAR LENS PLACEMENT LEFT EYE;  Surgeon: Fabio Pierce, MD;  Location: [facility];  Service: Ophthalmology;  Laterality: Left;  CDE:6.84  . CATARACT EXTRACTION W/PHACO Right 05/09/2020   Procedure: CATARACT  EXTRACTION PHACO AND INTRAOCULAR LENS PLACEMENT (IOC);  Surgeon: Fabio Pierce, MD;  Location: [facility];  Service: Ophthalmology;  Laterality: Right;  CDE: 6.55    Family History  Problem Relation Age of Onset  . Gout Father   . Stroke Father   . Heart attack Father   . Dementia Mother    Social History   Socioeconomic History  . Marital status: Single    Spouse name: Not on file  . Number of children: 0  . Years of education: 36  . Highest education level: High school graduate  Occupational History  . Occupation: Retired  Tobacco Use  .  Smoking status: Never Smoker  . Smokeless tobacco: Never Used  Vaping Use  . Vaping Use: Never used  Substance and Sexual Activity  . Alcohol use: No  . Drug use: No  . Sexual activity: Not on file  Other Topics Concern  . Not on file  Social History Narrative   Lives alone.   Right-handed.   No daily caffeine use.   Social Determinants of Health   Financial Resource Strain: Not on file  Food Insecurity: Not on file  Transportation Needs: Not on file  Physical Activity: Not on file  Stress: Not on file  Social Connections: Not on file    Tobacco Counseling Counseling given: Not Answered   Clinical Intake:  Pre-visit preparation completed: Yes  Pain : No/denies pain     BMI - recorded: 24.37 Nutritional Status: BMI of 19-24  Normal Nutritional Risks: None Diabetes: No  How often do you need to have someone help you when you read instructions, pamphlets, or other written materials from your doctor or pharmacy?: 3 - Sometimes What is the last grade level you completed in school?: 12th grade  Diabetic? No  Interpreter Needed?: No  Information entered by :: Jiles Prows, NP-C   Activities of Daily Living In your present state of health, do you have any difficulty performing the following activities: 11/02/2020 04/25/2020  Hearing? N N  Vision? Y N  Difficulty concentrating or making decisions? Malvin Johns  Walking or climbing stairs? N N  Dressing or bathing? N N  Doing errands, shopping? Y N  Preparing Food and eating ? Y -  Using the Toilet? N -  In the past six months, have you accidently leaked urine? N -  Do you have problems with loss of bowel control? N -  Managing your Medications? Y -  Managing your Finances? Y -  Housekeeping or managing your Housekeeping? Y -  Some recent data might be hidden    Patient Care Team: Elenore Paddy, NP as PCP - General (Nurse Practitioner) Jonelle Sidle, MD as PCP - Cardiology (Cardiology) Pollyann Savoy, MD as  Consulting Physician (Rheumatology) Fabio Pierce, MD as Consulting Physician (Ophthalmology)  Indicate any recent Medical Services you may have received from other than [facility] providers in the past year (date may be approximate).     Assessment:   This is a routine wellness examination for [place].  Hearing/Vision screen No exam data present  Dietary issues and exercise activities discussed: Current Exercise Habits: The patient does not participate in regular exercise at present, Exercise limited by: neurologic condition(s)  Goals   None    Depression Screen PHQ 2/9 Scores 11/02/2020 10/20/2019 04/01/2019 02/24/2019  PHQ - 2 Score 2 0 0 0  PHQ- 9 Score 3 - - -    Fall Risk Fall Risk  11/02/2020 10/20/2019 04/01/2019 02/24/2019  Falls in the past year? 0 0 0 0  Number falls in past yr: 0 0 0 0  Injury with Fall? 0 0 0 0  Risk for fall due to : No Fall Risks - - -  Follow up Falls evaluation completed;Education provided;Falls prevention discussed Falls evaluation completed - -    FALL RISK PREVENTION PERTAINING TO THE HOME:  Any stairs in or around the home? Yes  If so, are there any without handrails? Yes  - just a few steps at porch without railings Home free of loose throw rugs in walkways, pet beds, electrical cords, etc? Yes  Adequate lighting in your home to reduce risk of falls? Yes   ASSISTIVE DEVICES UTILIZED TO PREVENT FALLS:  Life alert? No  Use of a cane, walker or w/c? No  Grab bars in the bathroom? No  Shower chair or bench in shower? No  Elevated toilet seat or a handicapped toilet? No   TIMED UP AND GO:  Was the test performed? Yes .  Length of time to ambulate 10 feet: 11 sec.   Gait steady and fast without use of assistive device  Cognitive Function: MMSE - Mini Mental State Exam 08/23/2020 10/07/2019 04/28/2019 02/24/2019  Not completed: - - - Unable to complete  Orientation to time 3 3 3  0  Orientation to Place 4 3 4  0  Registration 3 3 3  0  Attention/  Calculation 0 0 0 0  Recall 0 0 0 3  Language- name 2 objects 2 2 2 2   Language- repeat 0 1 1 1   Language- follow 3 step command 3 3 3  0  Language- read & follow direction 1 1 1 1   Write a sentence 0 0 1 0  Copy design 0 0 1 1  Total score 16 16 19 8      6CIT Screen 11/02/2020 10/20/2019  What Year? 4 points 0 points  What month? 0 points 0 points  What time? 0 points 0 points  Count back from 20 0 points 0 points  Months in reverse 4 points 0 points  Repeat phrase 10 points 4 points  Total Score 18 4    Immunizations Immunization History  Administered Date(s) Administered  . Fluad Quad(high Dose 65+) 04/23/2019  . Influenza-Unspecified 04/03/2018, 05/30/2020  . Moderna Sars-Covid-2 Vaccination 09/03/2019, 09/26/2019, 06/07/2020  . Pneumococcal Polysaccharide-23 07/28/2014  . Tdap 07/16/1992    TDAP status: Due, Education has been provided regarding the importance of this vaccine. Advised may receive this vaccine at local pharmacy or Health Dept. Aware to provide a copy of the vaccination record if obtained from local pharmacy or Health Dept. Verbalized acceptance and understanding.  Flu Vaccine status: Up to date  Pneumococcal vaccine status: Up to date  Covid-19 vaccine status: Completed vaccines  Qualifies for Shingles Vaccine? No   Zostavax completed No   Shingrix Completed?: No.    Education has been provided regarding the importance of this vaccine. Patient has been advised to call insurance company to determine out of pocket expense if they have not yet received this vaccine. Advised may also receive vaccine at local pharmacy or Health Dept. Verbalized acceptance and understanding.  Screening Tests Health Maintenance  Topic Date Due  . MAMMOGRAM  Never done  . TETANUS/TDAP  07/16/2002  . PNA vac Low Risk Adult (2 of 2 - PCV13) 07/29/2015  . DEXA SCAN  02/01/2021 (Originally 01/24/2012)  . COLONOSCOPY (Pts 45-51yrs Insurance coverage will need to be confirmed)   02/01/2021 (  Originally 01/24/1992)  . Hepatitis C Screening  02/01/2021 (Originally 08-06-1945)  . INFLUENZA VACCINE  02/13/2021  . COVID-19 Vaccine  Completed  . HPV VACCINES  Aged Out    Health Maintenance  Health Maintenance Due  Topic Date Due  . MAMMOGRAM  Never done  . TETANUS/TDAP  07/16/2002  . PNA vac Low Risk Adult (2 of 2 - PCV13) 07/29/2015    Patient is not interested in colon cancer screening at this time.  Mammogram status: Ordered 01/2020. Pt provided with contact info and advised to call to schedule appt.   Patient is not interested in undergoing bone density scan at this time.  Lung Cancer Screening: (Low Dose CT Chest recommended if Age 60-80 years, 30 pack-year currently smoking OR have quit w/in 15years.) does not qualify.   Lung Cancer Screening Referral: N/A  Additional Screening:  Hepatitis C Screening: does qualify; Completed: not done per patient preference  Vision Screening: Recommended annual ophthalmology exams for early detection of glaucoma and other disorders of the eye. Is the patient up to date with their annual eye exam?  No  - scheduled for 11/07/20 Who is the provider or what is the name of the office in which the patient attends annual eye exams? [facility] Doctor If pt is not established with a provider, would they like to be referred to a provider to establish care? No .   Dental Screening: Recommended annual dental exams for proper oral hygiene  Community Resource Referral / Chronic Care Management: CRR required this visit?  No   CCM required this visit?  No      Plan:    Patient is up-to-date with screenings that she is willing to undergo, except for mammogram.  We will work on getting this scheduled for her.  She was encouraged to consider shingles vaccine as well as a second booster shot for COVID-19 vaccine.  She is due for tetanus shot, but would like to hold off on this unless she is cut or serious burn.  She was encouraged to  let us know if this occurs so we can administer this.  She was encouraged to talk to her pharmacist about the shingles vaccine.  I have personally reviewed and noted the following in the patient's chart:   . Medical and social history . Use of alcohol, tobacco or illicit drugs  . Current medications and supplements . Functional ability and status . Nutritional status . Physical activity . Advanced directives . List of other physicians . Hospitalizations, surgeries, and ER visits in previous 12 months . Vitals . Screenings to include cognitive, depression, and falls . Referrals and appointments  In addition, I have reviewed and discussed with patient certain preventive protocols, quality metrics, and best practice recommendations. A written personalized care plan for preventive services as well as general preventive health recommendations were provided to patient.    Patient will follow-up for office visit in approximately 2 months and then again for annual's visit in 1 year. Elenore Paddy, NP   11/02/2020

## 2020-11-03 NOTE — Telephone Encounter (Signed)
Monday 12:00 noon @ [facility]; calling to notify son.

## 2020-11-03 NOTE — Telephone Encounter (Signed)
Thank you :)

## 2020-11-07 ENCOUNTER — Ambulatory Visit (HOSPITAL_COMMUNITY): Payer: Medicare HMO

## 2020-11-10 DIAGNOSIS — R768 Other specified abnormal immunological findings in serum: Secondary | ICD-10-CM | POA: Diagnosis not present

## 2020-11-10 DIAGNOSIS — M1990 Unspecified osteoarthritis, unspecified site: Secondary | ICD-10-CM | POA: Diagnosis not present

## 2020-11-10 DIAGNOSIS — M0579 Rheumatoid arthritis with rheumatoid factor of multiple sites without organ or systems involvement: Secondary | ICD-10-CM | POA: Diagnosis not present

## 2020-11-10 DIAGNOSIS — F0390 Unspecified dementia without behavioral disturbance: Secondary | ICD-10-CM | POA: Diagnosis not present

## 2020-11-10 DIAGNOSIS — M2550 Pain in unspecified joint: Secondary | ICD-10-CM | POA: Diagnosis not present

## 2020-11-24 DIAGNOSIS — H3411 Central retinal artery occlusion, right eye: Secondary | ICD-10-CM | POA: Diagnosis not present

## 2020-11-24 DIAGNOSIS — H401113 Primary open-angle glaucoma, right eye, severe stage: Secondary | ICD-10-CM | POA: Diagnosis not present

## 2020-11-24 DIAGNOSIS — H401122 Primary open-angle glaucoma, left eye, moderate stage: Secondary | ICD-10-CM | POA: Diagnosis not present

## 2021-01-18 ENCOUNTER — Other Ambulatory Visit: Payer: Self-pay

## 2021-01-18 ENCOUNTER — Telehealth (INDEPENDENT_AMBULATORY_CARE_PROVIDER_SITE_OTHER): Payer: Self-pay | Admitting: Nurse Practitioner

## 2021-01-18 ENCOUNTER — Ambulatory Visit (INDEPENDENT_AMBULATORY_CARE_PROVIDER_SITE_OTHER): Payer: Medicare HMO | Admitting: Nurse Practitioner

## 2021-01-18 ENCOUNTER — Encounter (INDEPENDENT_AMBULATORY_CARE_PROVIDER_SITE_OTHER): Payer: Self-pay | Admitting: Nurse Practitioner

## 2021-01-18 VITALS — BP 120/70 | HR 67 | Temp 97.5°F | Resp 18 | Ht 61.0 in | Wt 125.8 lb

## 2021-01-18 DIAGNOSIS — D509 Iron deficiency anemia, unspecified: Secondary | ICD-10-CM

## 2021-01-18 DIAGNOSIS — N289 Disorder of kidney and ureter, unspecified: Secondary | ICD-10-CM

## 2021-01-18 DIAGNOSIS — G309 Alzheimer's disease, unspecified: Secondary | ICD-10-CM | POA: Diagnosis not present

## 2021-01-18 DIAGNOSIS — F0281 Dementia in other diseases classified elsewhere with behavioral disturbance: Secondary | ICD-10-CM | POA: Diagnosis not present

## 2021-01-18 MED ORDER — SERTRALINE HCL 25 MG PO TABS: 25.0000 mg | ORAL_TABLET | Freq: Every day | ORAL | 1 refills | Status: DC

## 2021-01-18 NOTE — Progress Notes (Signed)
Subjective:  Patient ID: Wendy Mccann, female    DOB: Oct 22, 1945  Age: 75 y.o. MRN: [id number]  CC:  Chief Complaint  Patient presents with   Dementia   Anemia   Other    Renal insufficiency      HPI  This patient arrives today for the above.  She is accompanied by her son.  Dementia: She has a diagnosis of dementia.  She is on Aricept and Namenda.  Her son tells me she is been having a harder time sleeping at night.  She will often hear her doorbell ringing, but her son says they have a camera at the house and there is nobody actually ringing the doorbell.  This is very distressing to her.  She is also been getting very irritable and anxious during the day because she is starting to forget where she places items and will have to call her son to see if he has them.  He is concerned about her mood and her sleeping.  She feels that her mood and sleeping are not bothersome.  Anemia: She has a history of iron deficiency anemia.  She tells me she does not recall having undergone evaluation with gastroenterologist to determine etiology of her anemia.  She is not interested in colonoscopy at this time.  She is on iron supplement.  Last CBC was collected about 8 months ago and it showed no anemia.  She denies any visible blood in her stool or constipation.  Renal insufficiency: Last CMP was collected about a year ago and it did show some mild renal insufficiency.  She will be due to have CMP rechecked today.  Past Medical History:  Diagnosis Date   Alzheimer's dementia ([place])    Anemia    Cataract    Bilateral   Central retinal artery occlusion    Rotator cuff (capsule) sprain       Family History  Problem Relation Age of Onset   Gout Father    Stroke Father    Heart attack Father    Dementia Mother     Social History   Social History Narrative   Lives alone.   Right-handed.   No daily caffeine use.   Social History   Tobacco Use   Smoking status: Never    Smokeless tobacco: Never  Substance Use Topics   Alcohol use: No     Current Meds  Medication Sig   acetaminophen (TYLENOL) 500 MG tablet Take 500 mg by mouth every 6 (six) hours as needed for mild pain.   aspirin EC 81 MG tablet Take 81 mg by mouth daily. Swallow whole.   donepezil (ARICEPT) 10 MG tablet Take 1 tablet (10 mg total) by mouth at bedtime.   ferrous sulfate 325 (65 FE) MG tablet Take 1 tablet (325 mg total) by mouth daily with breakfast. (Patient taking differently: Take 325 mg by mouth once a week.)   hydroxychloroquine (PLAQUENIL) 200 MG tablet Take 1 tablet 200 mg BID Monday-Friday (Patient taking differently: Take 200 mg by mouth See admin instructions. Take 200 mg twice a day  Monday-Friday)   latanoprost (XALATAN) 0.005 % ophthalmic solution Place 1 drop into both eyes at bedtime.   melatonin 1 MG TABS tablet Take 1 mg by mouth at bedtime.   memantine (NAMENDA) 10 MG tablet Take 1 tablet (10 mg total) by mouth 2 (two) times daily. Please call [phone] to schedule appt.   sertraline (ZOLOFT) 25 MG tablet Take 1 tablet (  25 mg total) by mouth daily.   timolol (BETIMOL) 0.25 % ophthalmic solution Place 1 drop into both eyes 2 (two) times daily.   UNABLE TO FIND Med Name: OTC stool softners as needed. "exlax"    ROS:  Review of Systems  Constitutional:  Positive for weight loss. Negative for malaise/fatigue.  Respiratory:  Negative for shortness of breath.   Cardiovascular:  Negative for chest pain.  Gastrointestinal:  Negative for abdominal pain, blood in stool and constipation.  Neurological:  Negative for dizziness and headaches.  Psychiatric/Behavioral:  Positive for hallucinations and memory loss. The patient is nervous/anxious and has insomnia.     Objective:   Today's Vitals: BP 120/70 (BP Location: Right Arm, Patient Position: Sitting, Cuff Size: Small)   Pulse 67   Temp (!) 97.5 F (36.4 C) (Temporal)   Resp 18   Ht 5' 1"  (1.549 m)   Wt 125 lb 12.8 oz  (57.1 kg)   SpO2 96%   BMI 23.77 kg/m  Vitals with BMI 01/18/2021 11/02/2020 08/23/2020  Height 5' 1"  - 5' 1"   Weight 125 lbs 13 oz 129 lbs 130 lbs  BMI 54.00 - 86.76  Systolic 195 093 267  Diastolic 70 71 85  Pulse 67 60 62  Some encounter information is confidential and restricted. Go to Review Flowsheets activity to see all data.     Physical Exam Vitals reviewed.  Constitutional:      General: She is not in acute distress.    Appearance: Normal appearance.  HENT:     Head: Normocephalic and atraumatic.  Neck:     Vascular: No carotid bruit.  Cardiovascular:     Rate and Rhythm: Normal rate and regular rhythm.     Pulses: Normal pulses.     Heart sounds: Normal heart sounds.  Pulmonary:     Effort: Pulmonary effort is normal.     Breath sounds: Normal breath sounds.  Skin:    General: Skin is warm and dry.  Neurological:     General: No focal deficit present.     Mental Status: She is alert and oriented to person, place, and time.  Psychiatric:        Mood and Affect: Mood normal.        Behavior: Behavior normal.        Judgment: Judgment normal.         Assessment and Plan   1. Acute renal impairment   2. Alzheimer's dementia with behavioral disturbance, unspecified timing of dementia onset ([place])   3. Iron deficiency anemia, unspecified iron deficiency anemia type      Plan: 1.  We will check CMP today for further evaluation. 2.  We had long discussion regarding risks versus benefits for pharmacological therapy for her anxiety and difficulty with sleeping.  Per shared decision making I have recommended and the patient will try melatonin at night as needed.  She also try low-dose of sertraline to see if this helps with her anxiety during the day.  They were warned that if she experience any suicidal ideation to let me know right away.  She will follow-up in about 6 weeks to see how she is tolerating medication changes. 3.  We will check CBC, iron, and ferritin  levels today.  We did discuss that I recommend she undergo testing with gastroenterologist to rule out any colon cancer that may be contributing to her iron deficiency anemia.  For now she wants to hold off on this but will  consider this at next follow-up.   Tests ordered Orders Placed This Encounter  Procedures   CMP with eGFR([facility])   CBC with Differential/Platelets   Iron   Ferritin      Meds ordered this encounter  Medications   sertraline (ZOLOFT) 25 MG tablet    Sig: Take 1 tablet (25 mg total) by mouth daily.    Dispense:  30 tablet    Refill:  1    Order Specific Question:   Supervising Provider    Answer:   Doree Albee [[id number]]    Patient to follow-up in 6 weeks or sooner as needed.  Ailene Ards, NP

## 2021-01-18 NOTE — Telephone Encounter (Signed)
Will you look into mammogram and carotid artery ultrasound that has been ordered? Doesn't appear to have been scheduled yet.

## 2021-01-18 NOTE — Telephone Encounter (Signed)
So mammogram was rescheduled,but the other order Dr Gracy Racer done looks closed. Unsure what is this a new order?

## 2021-01-18 NOTE — Patient Instructions (Signed)
[employer] for melatonin. Take 1-5mg  by mouth at bedtime as needed for sleep.

## 2021-01-19 LAB — CBC WITH DIFFERENTIAL/PLATELET
Absolute Monocytes: 759 cells/uL (ref 200–950)
Basophils Absolute: 50 cells/uL (ref 0–200)
Basophils Relative: 0.9 %
Eosinophils Absolute: 385 cells/uL (ref 15–500)
Eosinophils Relative: 7 %
HCT: 36.6 % (ref 35.0–45.0)
Hemoglobin: 11.9 g/dL (ref 11.7–15.5)
Lymphs Abs: 1513 cells/uL (ref 850–3900)
MCH: 32 pg (ref 27.0–33.0)
MCHC: 32.5 g/dL (ref 32.0–36.0)
MCV: 98.4 fL (ref 80.0–100.0)
MPV: 11.7 fL (ref 7.5–12.5)
Monocytes Relative: 13.8 %
Neutro Abs: 2794 cells/uL (ref 1500–7800)
Neutrophils Relative %: 50.8 %
Platelets: 220 10*3/uL (ref 140–400)
RBC: 3.72 10*6/uL — ABNORMAL LOW (ref 3.80–5.10)
RDW: 11.8 % (ref 11.0–15.0)
Total Lymphocyte: 27.5 %
WBC: 5.5 10*3/uL (ref 3.8–10.8)

## 2021-01-19 LAB — COMPLETE METABOLIC PANEL WITH GFR
AG Ratio: 1.3 (calc) (ref 1.0–2.5)
ALT: 13 U/L (ref 6–29)
AST: 18 U/L (ref 10–35)
Albumin: 3.7 g/dL (ref 3.6–5.1)
Alkaline phosphatase (APISO): 75 U/L (ref 37–153)
BUN: 13 mg/dL (ref 7–25)
CO2: 28 mmol/L (ref 20–32)
Calcium: 9.3 mg/dL (ref 8.6–10.4)
Chloride: 107 mmol/L (ref 98–110)
Creat: 0.9 mg/dL (ref 0.60–0.93)
GFR, Est African American: 74 mL/min/{1.73_m2} (ref >=60)
GFR, Est Non African American: 63 mL/min/{1.73_m2} (ref >=60)
Globulin: 2.8 g/dL (calc) (ref 1.9–3.7)
Glucose, Bld: 75 mg/dL (ref 65–99)
Potassium: 4 mmol/L (ref 3.5–5.3)
Sodium: 142 mmol/L (ref 135–146)
Total Bilirubin: 0.6 mg/dL (ref 0.2–1.2)
Total Protein: 6.5 g/dL (ref 6.1–8.1)

## 2021-01-19 LAB — FERRITIN: Ferritin: 190 ng/mL (ref 16–288)

## 2021-01-19 LAB — IRON: Iron: 47 ug/dL (ref 45–160)

## 2021-01-26 NOTE — Progress Notes (Signed)
Carotid duplex u/s canceled as patient already had this completed with cardiologist.

## 2021-01-26 NOTE — Addendum Note (Signed)
Addended by: Elenore Paddy on: 01/26/2021 08:36 AM   Modules accepted: Orders

## 2021-01-26 NOTE — Telephone Encounter (Signed)
Thank you :)

## 2021-01-30 IMAGING — CR RIGHT ANKLE - COMPLETE 3+ VIEW
3 series · 6 of 6 positions shown · non-contrast
Comparison: None.

CLINICAL DATA: Right foot and ankle pain and swelling.

EXAM:
RIGHT ANKLE - COMPLETE 3+ VIEW

[Series 1: ap · 0.17mm/px · 2 of 2 slices shown]
[im 1/2]
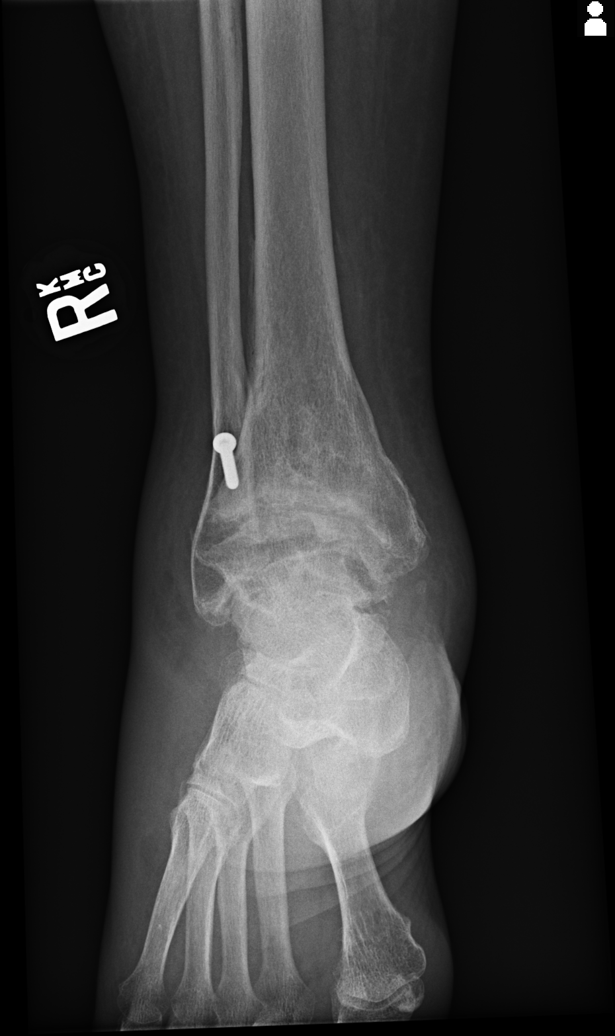
[im 2/2]
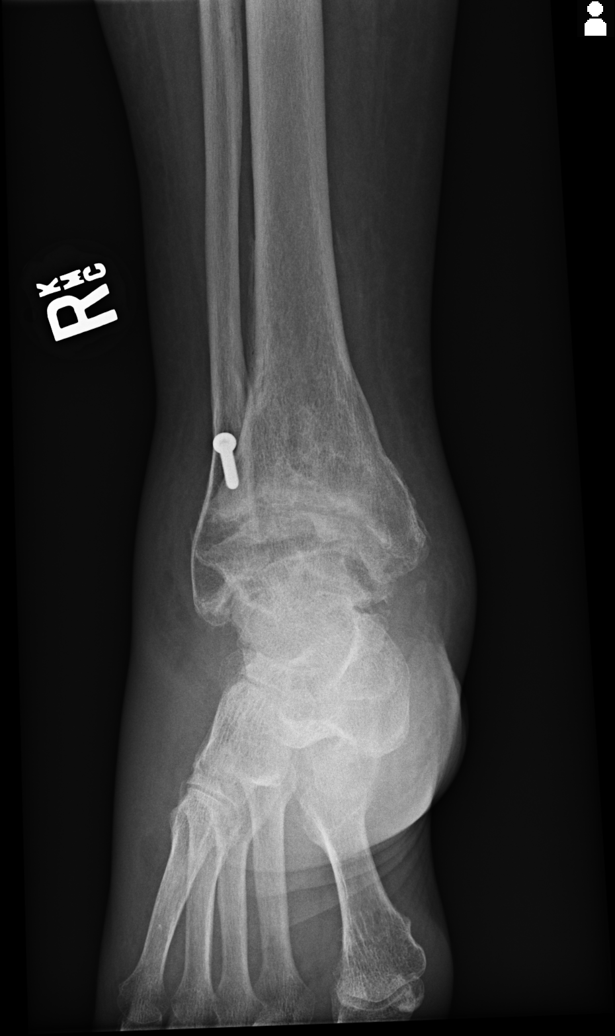

[Series 2: lat · 0.17mm/px · 2 of 2 slices shown]
[im 1/2]
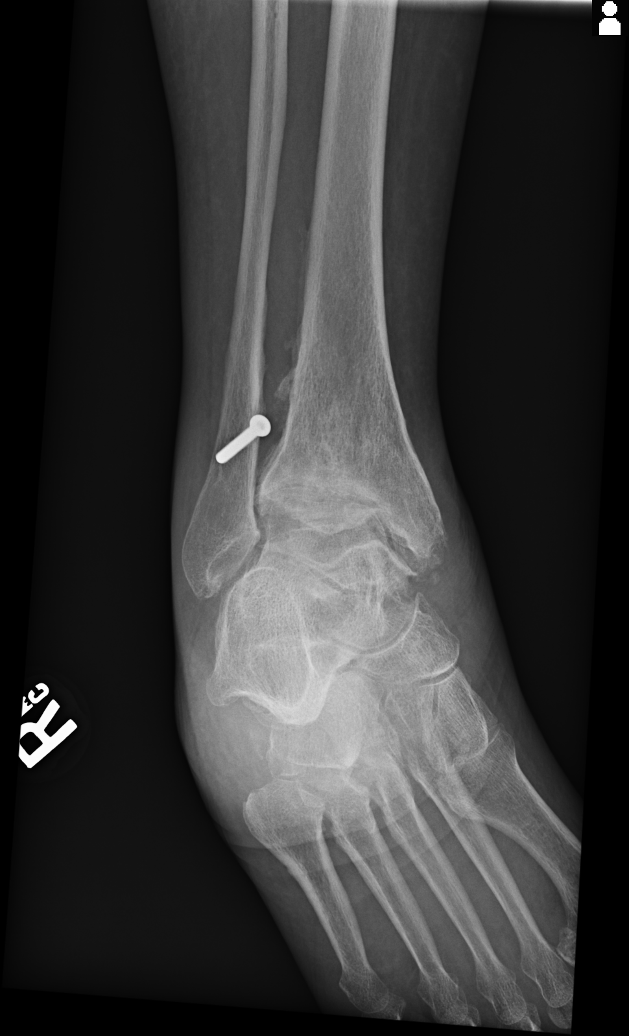
[im 2/2]
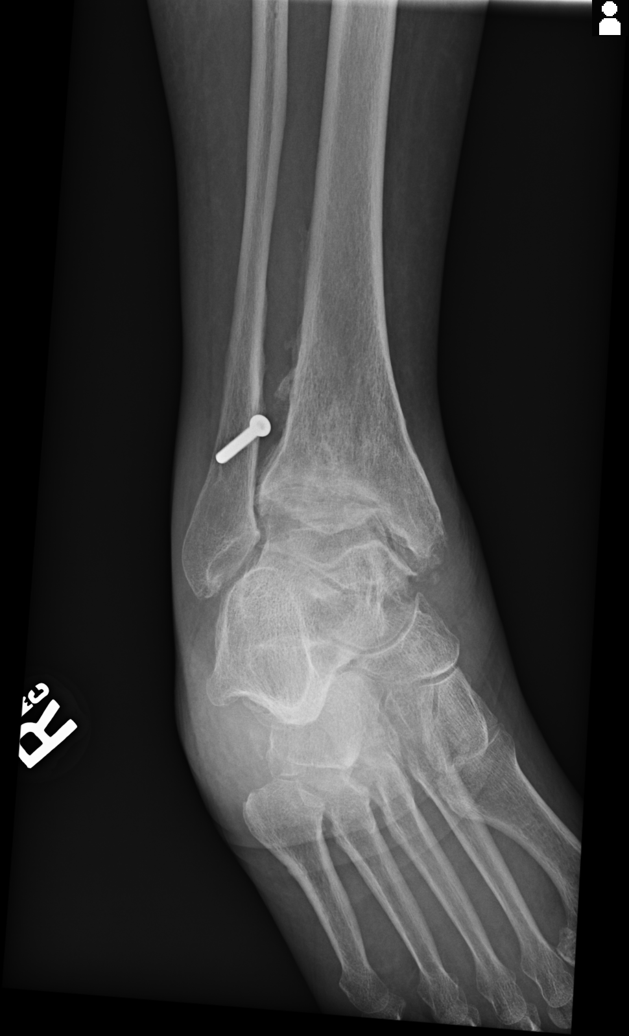

[Series 3: oblique · 0.17mm/px · 2 of 2 slices shown]
[im 1/2]
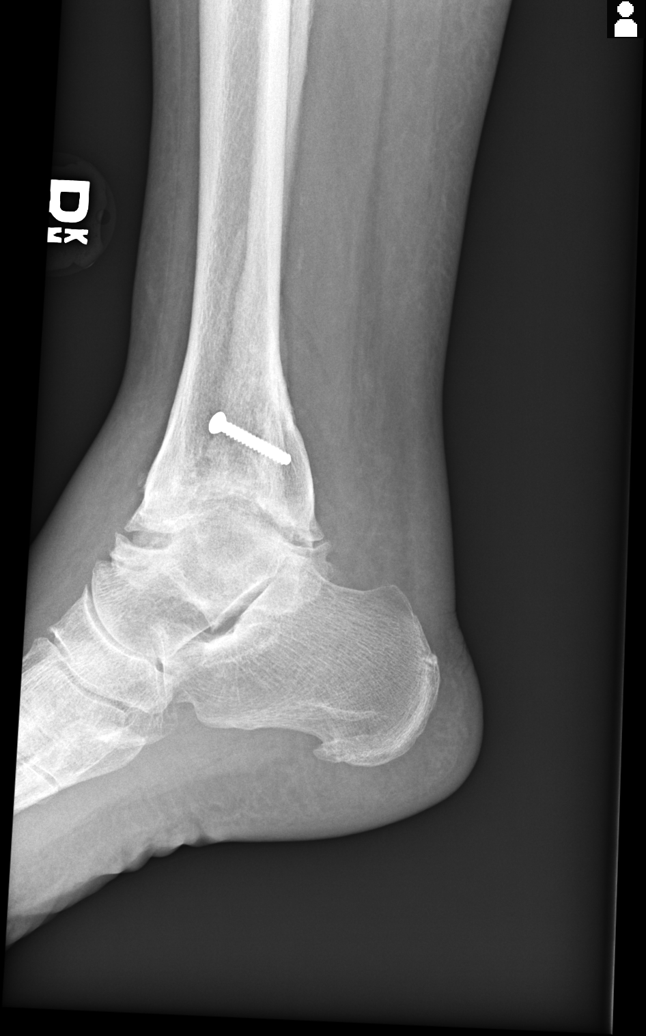
[im 2/2]
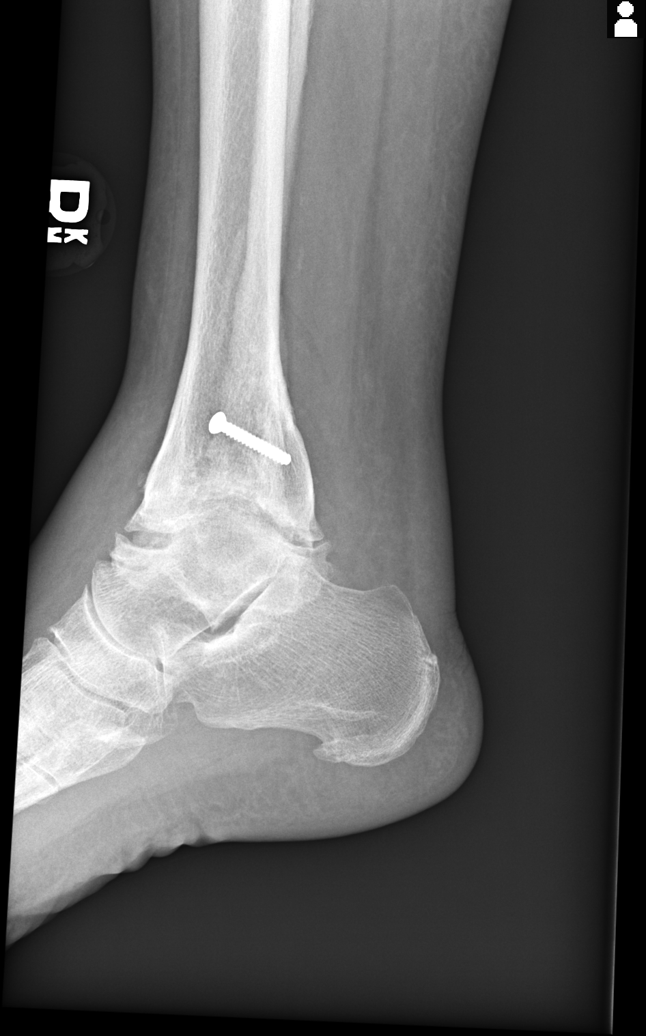

[6 of 6 positions shown; findings below may reference images not displayed]

FINDINGS: Single screw traverses the distal fibula. Advanced tibial talar
joint space narrowing with osseous remodeling, subchondral cystic
change and sclerosis. Mild bony fragmentation of the medial
malleolus. No definite tibial talar joint effusion. There is a small
plantar calcaneal spur. No bony destructive change. Moderate soft
tissue edema about the ankle. No soft tissue air. No radiopaque
foreign body.
IMPRESSION: 1. Moderate soft tissue edema about the ankle. No soft tissue air or
radiopaque foreign body. No radiographic findings of osteomyelitis.
2. Advanced tibial talar osteoarthritis.

## 2021-02-01 ENCOUNTER — Ambulatory Visit (HOSPITAL_COMMUNITY): Payer: Medicare HMO

## 2021-02-22 ENCOUNTER — Emergency Department (HOSPITAL_COMMUNITY)
Admission: EM | Admit: 2021-02-22 | Discharge: 2021-02-22 | Disposition: A | Discharge status: Home / Self Care | Payer: Medicare HMO | Attending: Emergency Medicine | Admitting: Emergency Medicine

## 2021-02-22 ENCOUNTER — Emergency Department (HOSPITAL_COMMUNITY): Payer: Medicare HMO

## 2021-02-22 ENCOUNTER — Encounter (HOSPITAL_COMMUNITY): Payer: Self-pay | Admitting: Emergency Medicine

## 2021-02-22 ENCOUNTER — Other Ambulatory Visit: Payer: Self-pay

## 2021-02-22 DIAGNOSIS — Z79899 Other long term (current) drug therapy: Secondary | ICD-10-CM | POA: Diagnosis not present

## 2021-02-22 DIAGNOSIS — Z7982 Long term (current) use of aspirin: Secondary | ICD-10-CM | POA: Insufficient documentation

## 2021-02-22 DIAGNOSIS — R443 Hallucinations, unspecified: Secondary | ICD-10-CM | POA: Diagnosis not present

## 2021-02-22 DIAGNOSIS — F0280 Dementia in other diseases classified elsewhere without behavioral disturbance: Secondary | ICD-10-CM

## 2021-02-22 DIAGNOSIS — Z20822 Contact with and (suspected) exposure to covid-19: Secondary | ICD-10-CM | POA: Insufficient documentation

## 2021-02-22 DIAGNOSIS — G309 Alzheimer's disease, unspecified: Secondary | ICD-10-CM | POA: Insufficient documentation

## 2021-02-22 DIAGNOSIS — Y900 Blood alcohol level of less than 20 mg/100 ml: Secondary | ICD-10-CM | POA: Insufficient documentation

## 2021-02-22 LAB — CBC WITH DIFFERENTIAL/PLATELET
Abs Immature Granulocytes: 0.05 10*3/uL (ref 0.00–0.07)
Basophils Absolute: 0 10*3/uL (ref 0.0–0.1)
Basophils Relative: 1 %
Eosinophils Absolute: 0.2 10*3/uL (ref 0.0–0.5)
Eosinophils Relative: 3 %
HCT: 38.2 % (ref 36.0–46.0)
Hemoglobin: 12.4 g/dL (ref 12.0–15.0)
Immature Granulocytes: 1 %
Lymphocytes Relative: 24 %
Lymphs Abs: 1.4 10*3/uL (ref 0.7–4.0)
MCH: 32.8 pg (ref 26.0–34.0)
MCHC: 32.5 g/dL (ref 30.0–36.0)
MCV: 101.1 fL — ABNORMAL HIGH (ref 80.0–100.0)
Monocytes Absolute: 0.7 10*3/uL (ref 0.1–1.0)
Monocytes Relative: 12 %
Neutro Abs: 3.5 10*3/uL (ref 1.7–7.7)
Neutrophils Relative %: 59 %
Platelets: 237 10*3/uL (ref 150–400)
RBC: 3.78 MIL/uL — ABNORMAL LOW (ref 3.87–5.11)
RDW: 12.5 % (ref 11.5–15.5)
WBC: 5.9 10*3/uL (ref 4.0–10.5)
nRBC: 0 % (ref 0.0–0.2)

## 2021-02-22 LAB — URINALYSIS, ROUTINE W REFLEX MICROSCOPIC
Bilirubin Urine: NEGATIVE
Glucose, UA: NEGATIVE mg/dL
Hgb urine dipstick: NEGATIVE
Ketones, ur: NEGATIVE mg/dL
Leukocytes,Ua: NEGATIVE
Nitrite: NEGATIVE
Protein, ur: NEGATIVE mg/dL
Specific Gravity, Urine: 1.025 (ref 1.005–1.030)
pH: 5 (ref 5.0–8.0)

## 2021-02-22 LAB — COMPREHENSIVE METABOLIC PANEL
ALT: 16 U/L (ref 0–44)
AST: 25 U/L (ref 15–41)
Albumin: 3.4 g/dL — ABNORMAL LOW (ref 3.5–5.0)
Alkaline Phosphatase: 70 U/L (ref 38–126)
Anion gap: 5 (ref 5–15)
BUN: 13 mg/dL (ref 8–23)
CO2: 27 mmol/L (ref 22–32)
Calcium: 9.2 mg/dL (ref 8.9–10.3)
Chloride: 107 mmol/L (ref 98–111)
Creatinine, Ser: 0.87 mg/dL (ref 0.44–1.00)
GFR, Estimated: >60 mL/min (ref >60)
Glucose, Bld: 119 mg/dL — ABNORMAL HIGH (ref 70–99)
Potassium: 4 mmol/L (ref 3.5–5.1)
Sodium: 139 mmol/L (ref 135–145)
Total Bilirubin: 0.7 mg/dL (ref 0.3–1.2)
Total Protein: 6.5 g/dL (ref 6.5–8.1)

## 2021-02-22 LAB — RESP PANEL BY RT-PCR (FLU A&B, COVID) ARPGX2
Influenza A by PCR: NEGATIVE
Influenza B by PCR: NEGATIVE
SARS Coronavirus 2 by RT PCR: NEGATIVE

## 2021-02-22 LAB — RAPID URINE DRUG SCREEN, HOSP PERFORMED
Amphetamines: NOT DETECTED
Barbiturates: NOT DETECTED
Benzodiazepines: NOT DETECTED
Cocaine: NOT DETECTED
Opiates: NOT DETECTED
Tetrahydrocannabinol: NOT DETECTED

## 2021-02-22 LAB — ETHANOL: Alcohol, Ethyl (B): <10 mg/dL (ref <10)

## 2021-02-22 MED ORDER — CITALOPRAM HYDROBROMIDE 20 MG PO TABS: 20.0000 mg | ORAL_TABLET | Freq: Every day | ORAL | 0 refills | Status: AC

## 2021-02-22 NOTE — ED Triage Notes (Signed)
Pt to the ED with her brother/caregiver who states the pt is having delusions about people whom are not in the room.  The pt was prescribed zoloft recently and has stopped taking it a week ago.

## 2021-02-22 NOTE — ED Notes (Signed)
Patient ambulated to restroom with one person assist. Patient has stable gait. Patient able to give urine specimen at this time.

## 2021-02-22 NOTE — ED Provider Notes (Signed)
[facility] EMERGENCY DEPARTMENT Provider Note   CSN: [id number] Arrival date & time: 02/22/21  1607     History Chief Complaint  Patient presents with   Hallucinations    Wendy Mccann is a 75 y.o. female.  Pt presents to the ED today with hallucinations.  The pt has a hx of Alzheimer's dementia.  She saw her pcp on 7/6 and was started on Zoloft.  Her brother said it seemed to cause hallucinations.  He said he stopped giving it to her about 1 week ago, but she is still having the hallucinations.  Pt said she tells her brother that people are in the house and they are not.  She used to live alone, but he is staying with her.  Pt said she otherwise feels well.      Past Medical History:  Diagnosis Date   Alzheimer's dementia (HCC)    Anemia    Cataract    Bilateral   Central retinal artery occlusion    Rotator cuff (capsule) sprain     Patient Active Problem List   Diagnosis Date Noted   Vision loss of left eye 05/19/2020   Central retinal artery occlusion of right eye 05/19/2020   Encounter for subsequent annual wellness visit (AWV) in Medicare patient 10/22/2019   Need for immunization against influenza 05/28/2019   Vitamin B12 deficiency 04/28/2019   Joint swelling 04/08/2019   Elevated rheumatoid factor 02/24/2019   B12 deficiency 02/24/2019   Dementia without behavioral disturbance (HCC) 02/07/2019   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   Acute metabolic encephalopathy 01/29/2019   Normocytic anemia 01/23/2019   Hypokalemia 01/22/2019   Volume depletion 01/22/2019   Hyperbilirubinemia 01/22/2019   Generalized weakness 08/12/2016   Orthostatic hypotension 08/12/2016   Abdominal pain 08/12/2016   Cough 08/12/2016   Dehydration, mild 08/12/2016    Past Surgical History:  Procedure Laterality Date   ANKLE SURGERY Bilateral    CATARACT EXTRACTION W/PHACO Left 04/25/2020   Procedure: CATARACT EXTRACTION PHACO AND INTRAOCULAR LENS PLACEMENT LEFT EYE;  Surgeon:  Fabio Pierce, MD;  Location: [facility];  Service: Ophthalmology;  Laterality: Left;  CDE:6.84   CATARACT EXTRACTION W/PHACO Right 05/09/2020   Procedure: CATARACT EXTRACTION PHACO AND INTRAOCULAR LENS PLACEMENT (IOC);  Surgeon: Fabio Pierce, MD;  Location: [facility];  Service: Ophthalmology;  Laterality: Right;  CDE: 6.55      OB History     Gravida  0   Para  0   Term  0   Preterm  0   AB  0   Living  0      SAB  0   IAB  0   Ectopic  0   Multiple  0   Live Births  0           Family History  Problem Relation Age of Onset   Gout Father    Stroke Father    Heart attack Father    Dementia Mother     Social History   Tobacco Use   Smoking status: Never   Smokeless tobacco: Never  Vaping Use   Vaping Use: Never used  Substance Use Topics   Alcohol use: No   Drug use: No    Home Medications Prior to Admission medications   Medication Sig Start Date End Date Taking? Authorizing Provider  acetaminophen (TYLENOL) 500 MG tablet Take 500 mg by mouth every 6 (six) hours as needed for mild pain.   Yes [provider]  aspirin  EC 81 MG tablet Take 81 mg by mouth daily. Swallow whole.   Yes [provider]  citalopram (CELEXA) 20 MG tablet Take 1 tablet (20 mg total) by mouth daily. 02/22/21  Yes Jacalyn Lefevre, MD  donepezil (ARICEPT) 10 MG tablet Take 1 tablet (10 mg total) by mouth at bedtime. 08/23/20  Yes Glean Salvo, NP  ferrous sulfate 325 (65 FE) MG tablet Take 1 tablet (325 mg total) by mouth daily with breakfast. Patient taking differently: Take 325 mg by mouth once a week. 01/28/19  Yes Sharee Holster, NP  hydroxychloroquine (PLAQUENIL) 200 MG tablet Take 1 tablet 200 mg BID Monday-Friday Patient taking differently: Take 200 mg by mouth See admin instructions. Take 200 mg twice a day  Monday-Friday 04/23/19  Yes Deveshwar, Janalyn Rouse, MD  latanoprost (XALATAN) 0.005 % ophthalmic solution Place 1 drop into both eyes at bedtime. 05/02/20   Yes [provider]  melatonin 1 MG TABS tablet Take 1 mg by mouth at bedtime.   Yes [provider]  memantine (NAMENDA) 10 MG tablet Take 1 tablet (10 mg total) by mouth 2 (two) times daily. Please call [phone] to schedule appt. 08/23/20  Yes Glean Salvo, NP  timolol (TIMOPTIC) 0.5 % ophthalmic solution Place 1 drop into both eyes daily. 02/06/21  Yes [provider]    Allergies    Patient has no known allergies.  Review of Systems   Review of Systems  Psychiatric/Behavioral:  Positive for hallucinations.   All other systems reviewed and are negative.  Physical Exam Updated Vital Signs BP 138/61   Pulse 60   Temp 98.4 F (36.9 C) (Oral)   Resp 20   Ht 5\' 1"  (1.549 m)   Wt 57.1 kg   SpO2 100%   BMI 23.79 kg/m   Physical Exam Vitals and nursing note reviewed.  Constitutional:      Appearance: Normal appearance.  HENT:     Head: Normocephalic and atraumatic.     Right Ear: External ear normal.     Left Ear: External ear normal.     Nose: Nose normal.     Mouth/Throat:     Mouth: Mucous membranes are moist.     Pharynx: Oropharynx is clear.  Eyes:     Extraocular Movements: Extraocular movements intact.     Conjunctiva/sclera: Conjunctivae normal.     Pupils: Pupils are equal, round, and reactive to light.  Cardiovascular:     Rate and Rhythm: Normal rate and regular rhythm.     Pulses: Normal pulses.     Heart sounds: Normal heart sounds.  Pulmonary:     Effort: Pulmonary effort is normal.     Breath sounds: Normal breath sounds.  Abdominal:     General: Abdomen is flat. Bowel sounds are normal.     Palpations: Abdomen is soft.  Musculoskeletal:        General: Normal range of motion.     Cervical back: Normal range of motion and neck supple.  Skin:    General: Skin is warm.     Capillary Refill: Capillary refill takes less than 2 seconds.  Neurological:     General: No focal deficit present.     Mental Status: She is alert.  Mental status is at baseline.  Psychiatric:        Mood and Affect: Mood normal.        Behavior: Behavior normal.    ED Results / Procedures / Treatments   Labs (  all labs ordered are listed, but only abnormal results are displayed) Labs Reviewed  COMPREHENSIVE METABOLIC PANEL - Abnormal; Notable for the following components:      Result Value   Glucose, Bld 119 (*)    Albumin 3.4 (*)    All other components within normal limits  CBC WITH DIFFERENTIAL/PLATELET - Abnormal; Notable for the following components:   RBC 3.78 (*)    MCV 101.1 (*)    All other components within normal limits  URINALYSIS, ROUTINE W REFLEX MICROSCOPIC - Abnormal; Notable for the following components:   APPearance HAZY (*)    All other components within normal limits  RESP PANEL BY RT-PCR (FLU A&B, COVID) ARPGX2  ETHANOL  RAPID URINE DRUG SCREEN, HOSP PERFORMED    EKG EKG Interpretation  Date/Time:  Wednesday February 22 2021 20:31:59 EDT Ventricular Rate:  52 PR Interval:  173 QRS Duration: 82 QT Interval:  444 QTC Calculation: 413 R Axis:   55 Text Interpretation: Sinus rhythm Low voltage, precordial leads Confirmed by Jacalyn Lefevre [phone]) on 02/22/2021 8:59:57 PM  Radiology DG Chest 2 View  Result Date: 02/22/2021 CLINICAL DATA:  Hallucinations EXAM: CHEST - 2 VIEW COMPARISON:  02/05/2019 FINDINGS: The heart size and mediastinal contours are within normal limits. Both lungs are clear. The visualized skeletal structures are unremarkable. IMPRESSION: No active cardiopulmonary disease. Electronically Signed   By: Marlan Palau M.D.   On: 02/22/2021 20:22    Procedures Procedures   Medications Ordered in ED Medications - No data to display  ED Course  I have reviewed the triage vital signs and the nursing notes.  Pertinent labs & imaging results that were available during my care of the patient were reviewed by me and considered in my medical decision making (see chart for details).     MDM Rules/Calculators/A&P                           Pt does not have any evidence of infection.  She is interested in trying a different medication.  I am going to have her stop Zoloft and start Celexa.  Her brother is going to stay with her. She is to return if worse.  F/u with pcp. Final Clinical Impression(s) / ED Diagnoses Final diagnoses:  Alzheimer's disease (HCC)  Hallucinations    Rx / DC Orders ED Discharge Orders          Ordered    citalopram (CELEXA) 20 MG tablet  Daily        02/22/21 2106             Jacalyn Lefevre, MD 02/22/21 2107

## 2021-02-22 NOTE — Discharge Instructions (Addendum)
Start with 10 mg (1/2 tablet) for 3 days.  You can increase it to 20 mg if you are tolerating it well.

## 2021-03-01 ENCOUNTER — Ambulatory Visit (INDEPENDENT_AMBULATORY_CARE_PROVIDER_SITE_OTHER): Payer: Medicare HMO | Admitting: Nurse Practitioner

## 2021-03-02 ENCOUNTER — Telehealth (INDEPENDENT_AMBULATORY_CARE_PROVIDER_SITE_OTHER): Payer: Medicare HMO | Admitting: Nurse Practitioner

## 2021-03-02 ENCOUNTER — Encounter (INDEPENDENT_AMBULATORY_CARE_PROVIDER_SITE_OTHER): Payer: Self-pay | Admitting: Nurse Practitioner

## 2021-03-02 ENCOUNTER — Telehealth (INDEPENDENT_AMBULATORY_CARE_PROVIDER_SITE_OTHER): Payer: Self-pay

## 2021-03-02 VITALS — Ht 61.0 in

## 2021-03-02 DIAGNOSIS — G309 Alzheimer's disease, unspecified: Secondary | ICD-10-CM

## 2021-03-02 DIAGNOSIS — F0281 Dementia in other diseases classified elsewhere with behavioral disturbance: Secondary | ICD-10-CM

## 2021-03-02 NOTE — Telephone Encounter (Signed)
Called patient yesterday and spoke to caregiver Casimiro Needle and let him know that he missed her appointment yesterday and rescheduled her for a telephone visit for this morning at 9:20am. Casimiro Needle stated yesterday that patient was doing better on Celexa. I attempted to call Casimiro Needle 2 different times this morning and each time he sent me to voice mail and I left a detailed voice message for him to call us back to go over medications and advise to help find a new provider for patient.

## 2021-03-02 NOTE — Progress Notes (Signed)
Patient was attempted to be reached twice this morning for office visit.  Unfortunately both times telephone call went to voicemail.  Medical assistant did talk to the patient's family member yesterday and the family member did mention that the patient is doing better on the Celexa currently.  Voicemail was left for patient or family member to call the office back if possible so we could do a virtual visit, and if not to make sure they reach out to find new primary care provider soon as possible as this office will be closing permanently within the next 2 weeks.

## 2021-03-15 ENCOUNTER — Other Ambulatory Visit: Payer: Self-pay | Admitting: Neurology

## 2021-03-16 DIAGNOSIS — F0390 Unspecified dementia without behavioral disturbance: Secondary | ICD-10-CM | POA: Diagnosis not present

## 2021-03-16 DIAGNOSIS — R768 Other specified abnormal immunological findings in serum: Secondary | ICD-10-CM | POA: Diagnosis not present

## 2021-03-16 DIAGNOSIS — D509 Iron deficiency anemia, unspecified: Secondary | ICD-10-CM | POA: Diagnosis not present

## 2021-03-16 DIAGNOSIS — M1990 Unspecified osteoarthritis, unspecified site: Secondary | ICD-10-CM | POA: Diagnosis not present

## 2021-03-16 DIAGNOSIS — H409 Unspecified glaucoma: Secondary | ICD-10-CM | POA: Diagnosis not present

## 2021-03-16 DIAGNOSIS — M069 Rheumatoid arthritis, unspecified: Secondary | ICD-10-CM | POA: Diagnosis not present

## 2021-03-16 DIAGNOSIS — M0579 Rheumatoid arthritis with rheumatoid factor of multiple sites without organ or systems involvement: Secondary | ICD-10-CM | POA: Diagnosis not present

## 2021-03-16 DIAGNOSIS — G309 Alzheimer's disease, unspecified: Secondary | ICD-10-CM | POA: Diagnosis not present

## 2021-03-16 DIAGNOSIS — M2550 Pain in unspecified joint: Secondary | ICD-10-CM | POA: Diagnosis not present

## 2021-03-21 DIAGNOSIS — H409 Unspecified glaucoma: Secondary | ICD-10-CM | POA: Diagnosis not present

## 2021-03-21 DIAGNOSIS — D509 Iron deficiency anemia, unspecified: Secondary | ICD-10-CM | POA: Diagnosis not present

## 2021-03-21 DIAGNOSIS — N189 Chronic kidney disease, unspecified: Secondary | ICD-10-CM | POA: Diagnosis not present

## 2021-03-27 ENCOUNTER — Other Ambulatory Visit (HOSPITAL_COMMUNITY): Payer: Self-pay | Admitting: Internal Medicine

## 2021-03-27 ENCOUNTER — Other Ambulatory Visit: Payer: Self-pay | Admitting: Neurology

## 2021-03-27 DIAGNOSIS — Z0001 Encounter for general adult medical examination with abnormal findings: Secondary | ICD-10-CM | POA: Diagnosis not present

## 2021-03-27 DIAGNOSIS — Z1389 Encounter for screening for other disorder: Secondary | ICD-10-CM | POA: Diagnosis not present

## 2021-03-27 DIAGNOSIS — Z23 Encounter for immunization: Secondary | ICD-10-CM | POA: Diagnosis not present

## 2021-03-27 DIAGNOSIS — Z1231 Encounter for screening mammogram for malignant neoplasm of breast: Secondary | ICD-10-CM

## 2021-03-27 DIAGNOSIS — G309 Alzheimer's disease, unspecified: Secondary | ICD-10-CM | POA: Diagnosis not present

## 2021-03-27 DIAGNOSIS — F411 Generalized anxiety disorder: Secondary | ICD-10-CM | POA: Diagnosis not present

## 2021-03-27 DIAGNOSIS — M069 Rheumatoid arthritis, unspecified: Secondary | ICD-10-CM | POA: Diagnosis not present

## 2021-03-27 DIAGNOSIS — Z1331 Encounter for screening for depression: Secondary | ICD-10-CM | POA: Diagnosis not present

## 2021-03-28 ENCOUNTER — Encounter: Payer: Self-pay | Admitting: Internal Medicine

## 2021-04-06 ENCOUNTER — Other Ambulatory Visit: Payer: Self-pay

## 2021-04-06 ENCOUNTER — Ambulatory Visit (HOSPITAL_COMMUNITY)
Admission: RE | Admit: 2021-04-06 | Discharge: 2021-04-06 | Disposition: A | Discharge status: Home / Self Care | Payer: Medicare HMO | Source: Ambulatory Visit | Attending: Internal Medicine | Admitting: Internal Medicine

## 2021-04-06 DIAGNOSIS — Z1231 Encounter for screening mammogram for malignant neoplasm of breast: Secondary | ICD-10-CM | POA: Diagnosis not present

## 2021-04-26 DIAGNOSIS — G309 Alzheimer's disease, unspecified: Secondary | ICD-10-CM | POA: Diagnosis not present

## 2021-04-26 DIAGNOSIS — M069 Rheumatoid arthritis, unspecified: Secondary | ICD-10-CM | POA: Diagnosis not present

## 2021-05-03 ENCOUNTER — Encounter: Payer: Self-pay | Admitting: *Deleted

## 2021-05-03 ENCOUNTER — Other Ambulatory Visit: Payer: Self-pay

## 2021-05-03 ENCOUNTER — Ambulatory Visit (INDEPENDENT_AMBULATORY_CARE_PROVIDER_SITE_OTHER): Payer: Self-pay | Admitting: *Deleted

## 2021-05-03 VITALS — Ht 61.0 in | Wt 115.8 lb

## 2021-05-03 DIAGNOSIS — Z1211 Encounter for screening for malignant neoplasm of colon: Secondary | ICD-10-CM

## 2021-05-03 MED ORDER — NA SULFATE-K SULFATE-MG SULF 17.5-3.13-1.6 GM/177ML PO SOLN: 1.0000 | Freq: Once | ORAL | 0 refills | Status: AC

## 2021-05-03 NOTE — Progress Notes (Signed)
OK to schedule with Dr. Marletta Lor. ASA II.   Hold iron x 7 days prior to procedure.

## 2021-05-03 NOTE — Progress Notes (Addendum)
Gastroenterology Pre-Procedure Review  Request Date: 05/03/2021 Requesting Physician: Dr. Felecia Shelling, no previous TCS  PATIENT REVIEW QUESTIONS: The patient responded to the following health history questions as indicated:    1. Diabetes Melitis: no 2. Joint replacements in the past 12 months: no 3. Major health problems in the past 3 months: no 4. Has an artificial valve or MVP: no 5. Has a defibrillator: no 6. Has been advised in past to take antibiotics in advance of a procedure like teeth cleaning: no 7. Family history of colon cancer: no  8. Alcohol Use: no 9. Illicit drug Use: no 10. History of sleep apnea: no  11. History of coronary artery or other vascular stents placed within the last 12 months: no 12. History of any prior anesthesia complications: no 13. Body mass index is 21.88 kg/m.    MEDICATIONS & ALLERGIES:    Patient reports the following regarding taking any blood thinners:   Plavix? no Aspirin? Yes, 81 mg Coumadin? no Brilinta? no Xarelto? no Eliquis? no Pradaxa? no Savaysa? no Effient? no  Patient confirms/reports the following medications:  Current Outpatient Medications  Medication Sig Dispense Refill   aspirin EC 81 MG tablet Take 81 mg by mouth daily. Swallow whole.     citalopram (CELEXA) 20 MG tablet Take 1 tablet (20 mg total) by mouth daily. 30 tablet 0   donepezil (ARICEPT) 10 MG tablet Take 1 tablet (10 mg total) by mouth at bedtime. 90 tablet 1   hydroxychloroquine (PLAQUENIL) 200 MG tablet Take 1 tablet 200 mg BID Monday-Friday (Patient taking differently: Take 200 mg by mouth See admin instructions. Take 200 mg twice a day  Monday-Friday) 120 tablet 0   melatonin 1 MG TABS tablet Take 1 mg by mouth at bedtime.     memantine (NAMENDA) 10 MG tablet Take 1 tablet (10 mg total) by mouth 2 (two) times daily. Please call [phone] to schedule appt. 180 tablet 1   timolol (TIMOPTIC) 0.5 % ophthalmic solution Place 1 drop into both eyes daily.      acetaminophen (TYLENOL) 500 MG tablet Take 500 mg by mouth every 6 (six) hours as needed for mild pain.     ferrous sulfate 325 (65 FE) MG tablet Take 1 tablet (325 mg total) by mouth daily with breakfast. (Patient taking differently: Take 325 mg by mouth once a week.) 30 tablet 0   No current facility-administered medications for this visit.    Patient confirms/reports the following allergies:  No Known Allergies  No orders of the defined types were placed in this encounter.   AUTHORIZATION INFORMATION Primary Insurance: [place],  ID #: [id number] Pre-Cert / Berkley Harvey required: Yes, approved online [id number] Pre-Cert / Berkley Harvey #: [id number]  SCHEDULE INFORMATION: Procedure has been scheduled as follows:  Date: 05/30/2021, Time: 9:00  Location: [facility] with Dr. Marletta Lor  This Gastroenterology Pre-Precedure Review Form is being routed to the following provider(s): Ermalinda Memos, PA-C

## 2021-05-04 ENCOUNTER — Other Ambulatory Visit: Payer: Self-pay | Admitting: *Deleted

## 2021-05-04 ENCOUNTER — Encounter: Payer: Self-pay | Admitting: *Deleted

## 2021-05-04 NOTE — Progress Notes (Signed)
Mailed letter with Iron medication adjustments.

## 2021-05-15 ENCOUNTER — Emergency Department (HOSPITAL_COMMUNITY)
Admission: EM | Admit: 2021-05-15 | Discharge: 2021-05-15 | Disposition: A | Discharge status: Home / Self Care | Payer: Medicare HMO | Attending: Emergency Medicine | Admitting: Emergency Medicine

## 2021-05-15 ENCOUNTER — Encounter (HOSPITAL_COMMUNITY): Payer: Self-pay | Admitting: *Deleted

## 2021-05-15 DIAGNOSIS — R109 Unspecified abdominal pain: Secondary | ICD-10-CM | POA: Diagnosis not present

## 2021-05-15 DIAGNOSIS — Z5321 Procedure and treatment not carried out due to patient leaving prior to being seen by health care provider: Secondary | ICD-10-CM | POA: Diagnosis not present

## 2021-05-15 NOTE — ED Triage Notes (Signed)
Pain in right flank onset 3 weeks

## 2021-05-25 DIAGNOSIS — M069 Rheumatoid arthritis, unspecified: Secondary | ICD-10-CM | POA: Diagnosis not present

## 2021-05-25 DIAGNOSIS — G309 Alzheimer's disease, unspecified: Secondary | ICD-10-CM | POA: Diagnosis not present

## 2021-05-25 DIAGNOSIS — M5451 Vertebrogenic low back pain: Secondary | ICD-10-CM | POA: Diagnosis not present

## 2021-05-29 ENCOUNTER — Telehealth: Payer: Self-pay | Admitting: Internal Medicine

## 2021-05-29 NOTE — Telephone Encounter (Signed)
Noted  

## 2021-05-29 NOTE — Telephone Encounter (Signed)
Patient brother called stating patient wants to reschedule procedure

## 2021-05-29 NOTE — Telephone Encounter (Signed)
Spoke to brother.  He informed me that pt could not go through procedure right now.  Having a lot of sinus issues and not in the best of health.  He said that he would get back in touch with [place] when they decided to reschedule procedure.  Called Kim in Endo and made her aware.

## 2021-05-30 ENCOUNTER — Encounter (HOSPITAL_COMMUNITY): Admission: RE | Payer: Self-pay | Source: Ambulatory Visit

## 2021-05-30 ENCOUNTER — Ambulatory Visit (HOSPITAL_COMMUNITY): Admission: RE | Admit: 2021-05-30 | Payer: Medicare HMO | Source: Ambulatory Visit

## 2021-05-30 SURGERY — COLONOSCOPY WITH PROPOFOL
Anesthesia: Monitor Anesthesia Care

## 2021-06-29 DIAGNOSIS — M069 Rheumatoid arthritis, unspecified: Secondary | ICD-10-CM | POA: Diagnosis not present

## 2021-06-29 DIAGNOSIS — G309 Alzheimer's disease, unspecified: Secondary | ICD-10-CM | POA: Diagnosis not present

## 2021-07-25 DIAGNOSIS — N390 Urinary tract infection, site not specified: Secondary | ICD-10-CM | POA: Diagnosis not present

## 2021-07-25 DIAGNOSIS — F411 Generalized anxiety disorder: Secondary | ICD-10-CM | POA: Diagnosis not present

## 2021-07-25 DIAGNOSIS — G4700 Insomnia, unspecified: Secondary | ICD-10-CM | POA: Diagnosis not present

## 2021-07-25 DIAGNOSIS — N189 Chronic kidney disease, unspecified: Secondary | ICD-10-CM | POA: Diagnosis not present

## 2021-08-25 DIAGNOSIS — H409 Unspecified glaucoma: Secondary | ICD-10-CM | POA: Diagnosis not present

## 2021-08-25 DIAGNOSIS — N189 Chronic kidney disease, unspecified: Secondary | ICD-10-CM | POA: Diagnosis not present

## 2021-09-15 DIAGNOSIS — G309 Alzheimer's disease, unspecified: Secondary | ICD-10-CM | POA: Diagnosis not present

## 2021-09-15 DIAGNOSIS — Z0001 Encounter for general adult medical examination with abnormal findings: Secondary | ICD-10-CM | POA: Diagnosis not present

## 2021-09-15 DIAGNOSIS — M069 Rheumatoid arthritis, unspecified: Secondary | ICD-10-CM | POA: Diagnosis not present

## 2021-09-15 DIAGNOSIS — F411 Generalized anxiety disorder: Secondary | ICD-10-CM | POA: Diagnosis not present

## 2021-09-15 DIAGNOSIS — Z23 Encounter for immunization: Secondary | ICD-10-CM | POA: Diagnosis not present

## 2021-09-15 DIAGNOSIS — Z1331 Encounter for screening for depression: Secondary | ICD-10-CM | POA: Diagnosis not present

## 2021-09-15 DIAGNOSIS — Z1389 Encounter for screening for other disorder: Secondary | ICD-10-CM | POA: Diagnosis not present

## 2021-10-17 DIAGNOSIS — M069 Rheumatoid arthritis, unspecified: Secondary | ICD-10-CM | POA: Diagnosis not present

## 2021-10-17 DIAGNOSIS — G309 Alzheimer's disease, unspecified: Secondary | ICD-10-CM | POA: Diagnosis not present

## 2021-11-08 ENCOUNTER — Encounter (INDEPENDENT_AMBULATORY_CARE_PROVIDER_SITE_OTHER): Payer: Medicare HMO | Admitting: Nurse Practitioner

## 2021-11-16 DIAGNOSIS — R441 Visual hallucinations: Secondary | ICD-10-CM | POA: Diagnosis not present

## 2021-11-16 DIAGNOSIS — G309 Alzheimer's disease, unspecified: Secondary | ICD-10-CM | POA: Diagnosis not present

## 2021-11-16 DIAGNOSIS — G4701 Insomnia due to medical condition: Secondary | ICD-10-CM | POA: Diagnosis not present

## 2021-11-16 DIAGNOSIS — G301 Alzheimer's disease with late onset: Secondary | ICD-10-CM | POA: Diagnosis not present

## 2021-11-16 DIAGNOSIS — M069 Rheumatoid arthritis, unspecified: Secondary | ICD-10-CM | POA: Diagnosis not present

## 2021-11-16 DIAGNOSIS — Z79899 Other long term (current) drug therapy: Secondary | ICD-10-CM | POA: Diagnosis not present

## 2021-11-23 ENCOUNTER — Emergency Department (HOSPITAL_COMMUNITY)
Admission: EM | Admit: 2021-11-23 | Discharge: 2021-11-23 | Disposition: A | Discharge status: Home / Self Care | Payer: Medicare HMO | Attending: Emergency Medicine | Admitting: Emergency Medicine

## 2021-11-23 ENCOUNTER — Emergency Department (HOSPITAL_COMMUNITY): Payer: Medicare HMO

## 2021-11-23 ENCOUNTER — Other Ambulatory Visit: Payer: Self-pay

## 2021-11-23 ENCOUNTER — Encounter (HOSPITAL_COMMUNITY): Payer: Self-pay

## 2021-11-23 DIAGNOSIS — G309 Alzheimer's disease, unspecified: Secondary | ICD-10-CM | POA: Diagnosis not present

## 2021-11-23 DIAGNOSIS — E860 Dehydration: Secondary | ICD-10-CM | POA: Diagnosis not present

## 2021-11-23 DIAGNOSIS — Z7982 Long term (current) use of aspirin: Secondary | ICD-10-CM | POA: Diagnosis not present

## 2021-11-23 DIAGNOSIS — F0280 Dementia in other diseases classified elsewhere without behavioral disturbance: Secondary | ICD-10-CM

## 2021-11-23 DIAGNOSIS — R4182 Altered mental status, unspecified: Secondary | ICD-10-CM | POA: Diagnosis not present

## 2021-11-23 DIAGNOSIS — R410 Disorientation, unspecified: Secondary | ICD-10-CM | POA: Insufficient documentation

## 2021-11-23 LAB — CBC WITH DIFFERENTIAL/PLATELET
Abs Immature Granulocytes: 0.03 10*3/uL (ref 0.00–0.07)
Basophils Absolute: 0 10*3/uL (ref 0.0–0.1)
Basophils Relative: 1 %
Eosinophils Absolute: 0.1 10*3/uL (ref 0.0–0.5)
Eosinophils Relative: 1 %
HCT: 38.6 % (ref 36.0–46.0)
Hemoglobin: 12.1 g/dL (ref 12.0–15.0)
Immature Granulocytes: 0 %
Lymphocytes Relative: 14 %
Lymphs Abs: 0.9 10*3/uL (ref 0.7–4.0)
MCH: 31.9 pg (ref 26.0–34.0)
MCHC: 31.3 g/dL (ref 30.0–36.0)
MCV: 101.8 fL — ABNORMAL HIGH (ref 80.0–100.0)
Monocytes Absolute: 0.8 10*3/uL (ref 0.1–1.0)
Monocytes Relative: 12 %
Neutro Abs: 4.8 10*3/uL (ref 1.7–7.7)
Neutrophils Relative %: 72 %
Platelets: 228 10*3/uL (ref 150–400)
RBC: 3.79 MIL/uL — ABNORMAL LOW (ref 3.87–5.11)
RDW: 12.8 % (ref 11.5–15.5)
WBC: 6.7 10*3/uL (ref 4.0–10.5)
nRBC: 0 % (ref 0.0–0.2)

## 2021-11-23 LAB — COMPREHENSIVE METABOLIC PANEL
ALT: 28 U/L (ref 0–44)
AST: 40 U/L (ref 15–41)
Albumin: 3.8 g/dL (ref 3.5–5.0)
Alkaline Phosphatase: 70 U/L (ref 38–126)
Anion gap: 8 (ref 5–15)
BUN: 24 mg/dL — ABNORMAL HIGH (ref 8–23)
CO2: 23 mmol/L (ref 22–32)
Calcium: 9.3 mg/dL (ref 8.9–10.3)
Chloride: 110 mmol/L (ref 98–111)
Creatinine, Ser: 0.87 mg/dL (ref 0.44–1.00)
GFR, Estimated: >60 mL/min (ref >60)
Glucose, Bld: 88 mg/dL (ref 70–99)
Potassium: 3.6 mmol/L (ref 3.5–5.1)
Sodium: 141 mmol/L (ref 135–145)
Total Bilirubin: 1 mg/dL (ref 0.3–1.2)
Total Protein: 7.2 g/dL (ref 6.5–8.1)

## 2021-11-23 LAB — URINALYSIS, ROUTINE W REFLEX MICROSCOPIC
Bilirubin Urine: NEGATIVE
Glucose, UA: NEGATIVE mg/dL
Hgb urine dipstick: NEGATIVE
Ketones, ur: 5 mg/dL — AB
Leukocytes,Ua: NEGATIVE
Nitrite: NEGATIVE
Protein, ur: NEGATIVE mg/dL
Specific Gravity, Urine: 1.028 (ref 1.005–1.030)
pH: 5 (ref 5.0–8.0)

## 2021-11-23 MED ORDER — SODIUM CHLORIDE 0.9 % IV SOLN: INTRAVENOUS | Status: DC

## 2021-11-23 MED ORDER — SODIUM CHLORIDE 0.9 % IV BOLUS
1000.0000 mL | Freq: Once | INTRAVENOUS | Status: AC
  Administered 2021-11-23: 1000 mL via INTRAVENOUS

## 2021-11-23 NOTE — ED Notes (Signed)
Patient required moderate assistance transferring from wheelchair to bed. Per brother patient is normally independent.  ?

## 2021-11-23 NOTE — Discharge Instructions (Signed)
Work-up today without indications for admission.  There was some mild dehydration but no significant lab changes.  Recommend making adjustments according to Dr. Ronal Fear office with her medicine.  Return for any new or worse symptoms. ?

## 2021-11-23 NOTE — ED Triage Notes (Addendum)
Patient arrived with complaints of weakness and soreness all over. Per brother at bedside she was sent by PCP. Per brother patient has started new medication, not sleeping, and has not been eating or drinking. Patient is talkative during triage process, but not oriented to situation.  ?

## 2021-11-23 NOTE — ED Notes (Signed)
Upon assessment patient is walking around room naked, removed all vital sign equipment, and removed IV. Patient hard to redirect back to bed. This nurse put gown back on patient, placed patient back in bed and moved to hallway for better visualization until room is available.  ?

## 2021-11-23 NOTE — ED Provider Notes (Signed)
[facility] EMERGENCY DEPARTMENT Provider Note   CSN: [id number] Arrival date & time: 11/23/21  4098     History  Chief Complaint  Patient presents with   Weakness    Wendy Mccann is a 76 y.o. female.  Patient brought in by family members.  At the request of Dr. Gerilyn Pilgrim neurology office.  He had made some medication adjustments for her on Monday or Tuesday to try to help her sleep better.  And actually family thinks it is made worse.  He increased her Aricept.  Patient has longstanding history of Alzheimer's disease.  Patient also not eating or drinking well according to family.  No fevers no nausea no vomiting.      Home Medications Prior to Admission medications   Medication Sig Start Date End Date Taking? Authorizing Provider  acetaminophen (TYLENOL) 500 MG tablet Take 500 mg by mouth as needed for mild pain.   Yes [provider]  aspirin EC 81 MG tablet Take 81 mg by mouth daily. Swallow whole.   Yes [provider]  donepezil (ARICEPT) 10 MG tablet Take 1 tablet (10 mg total) by mouth at bedtime. 08/23/20  Yes Glean Salvo, NP  hydroxychloroquine (PLAQUENIL) 200 MG tablet Take 1 tablet 200 mg BID Monday-Friday Patient taking differently: Take 200 mg by mouth See admin instructions. Take 200 mg twice a day  Monday-Friday 04/23/19  Yes Deveshwar, Janalyn Rouse, MD  memantine (NAMENDA) 10 MG tablet Take 1 tablet (10 mg total) by mouth 2 (two) times daily. Please call [phone] to schedule appt. 08/23/20  Yes Glean Salvo, NP  OLANZapine (ZYPREXA) 5 MG tablet Take 5-10 mg by mouth at bedtime. 11/20/21  Yes [provider]  citalopram (CELEXA) 20 MG tablet Take 1 tablet (20 mg total) by mouth daily. Patient not taking: Reported on 11/23/2021 02/22/21   Jacalyn Lefevre, MD  ferrous sulfate 325 (65 FE) MG tablet Take 1 tablet (325 mg total) by mouth daily with breakfast. Patient not taking: Reported on 11/23/2021 01/28/19   Sharee Holster, NP      Allergies     Patient has no known allergies.    Review of Systems   Review of Systems  Unable to perform ROS: Dementia  All other systems reviewed and are negative.  Physical Exam Updated Vital Signs BP (!) 114/54   Pulse 85   Temp 98.5 F (36.9 C) (Oral)   Resp (!) 21   Ht 1.549 m (5\' 1" )   Wt 53.7 kg   SpO2 100%   BMI 22.37 kg/m  Physical Exam Vitals and nursing note reviewed.  Constitutional:      General: She is not in acute distress.    Appearance: Normal appearance. She is well-developed.     Comments: Patient clearly has confusion.  But very alert.  HENT:     Head: Normocephalic and atraumatic.     Mouth/Throat:     Mouth: Mucous membranes are dry.     Comments: Mucous membranes dry Eyes:     Conjunctiva/sclera: Conjunctivae normal.  Cardiovascular:     Rate and Rhythm: Normal rate and regular rhythm.     Heart sounds: No murmur heard. Pulmonary:     Effort: Pulmonary effort is normal. No respiratory distress.     Breath sounds: Normal breath sounds.  Abdominal:     Palpations: Abdomen is soft.     Tenderness: There is no abdominal tenderness.  Musculoskeletal:        General: No  swelling.     Cervical back: Normal range of motion and neck supple.     Right lower leg: No edema.     Left lower leg: No edema.  Skin:    General: Skin is warm and dry.     Capillary Refill: Capillary refill takes less than 2 seconds.  Neurological:     Mental Status: She is alert. Mental status is at baseline. She is disoriented.     Cranial Nerves: No cranial nerve deficit.     Motor: No weakness.  Psychiatric:        Mood and Affect: Mood normal.    ED Results / Procedures / Treatments   Labs (all labs ordered are listed, but only abnormal results are displayed) Labs Reviewed  CBC WITH DIFFERENTIAL/PLATELET - Abnormal; Notable for the following components:      Result Value   RBC 3.79 (*)    MCV 101.8 (*)    All other components within normal limits  COMPREHENSIVE METABOLIC  PANEL - Abnormal; Notable for the following components:   BUN 24 (*)    All other components within normal limits  URINALYSIS, ROUTINE W REFLEX MICROSCOPIC - Abnormal; Notable for the following components:   Ketones, ur 5 (*)    All other components within normal limits    EKG EKG Interpretation  Date/Time:  Thursday Nov 23 2021 10:32:31 EDT Ventricular Rate:  90 PR Interval:  145 QRS Duration: 80 QT Interval:  371 QTC Calculation: 454 R Axis:   41 Text Interpretation: Sinus rhythm Low voltage, precordial leads Borderline T abnormalities, anterior leads No significant change since last tracing Confirmed by Vanetta Mulders [id number] on 11/23/2021 10:43:35 AM  Radiology CT Head Wo Contrast  Result Date: 11/23/2021 CLINICAL DATA:  Altered mental status, confusion EXAM: CT HEAD WITHOUT CONTRAST TECHNIQUE: Contiguous axial images were obtained from the base of the skull through the vertex without intravenous contrast. RADIATION DOSE REDUCTION: This exam was performed according to the departmental dose-optimization program which includes automated exposure control, adjustment of the mA and/or kV according to patient size and/or use of iterative reconstruction technique. COMPARISON:  Brain MRI 10/24/2019, CT head 01/23/2019 FINDINGS: Brain: There is no acute intracranial hemorrhage, extra-axial fluid collection, or acute infarct. Parenchymal volume is within normal limits for age. The ventricles are normal in size. Patchy hypodensity in the subcortical and periventricular white matter likely reflects sequela of mild chronic white matter microangiopathy. There is no mass lesion.  There is no mass effect or midline shift. Vascular: There is calcification of the bilateral cavernous ICAs. Skull: Normal. Negative for fracture or focal lesion. Sinuses/Orbits: The imaged paranasal sinuses are clear. Bilateral lens implants are in place. The globes and orbits are otherwise unremarkable. Other: None.  IMPRESSION: No acute intracranial pathology. Electronically Signed   By: Lesia Hausen M.D.   On: 11/23/2021 12:01   DG Chest Port 1 View  Result Date: 11/23/2021 CLINICAL DATA:  Increasing confusion EXAM: PORTABLE CHEST 1 VIEW COMPARISON:  Chest radiograph 02/22/2021 FINDINGS: The cardiomediastinal silhouette is within normal limits. There is no focal consolidation or pulmonary edema. There is no pleural effusion or pneumothorax There is no acute osseous abnormality. IMPRESSION: No radiographic evidence of acute cardiopulmonary process. Electronically Signed   By: Lesia Hausen M.D.   On: 11/23/2021 11:54    Procedures Procedures    Medications Ordered in ED Medications  0.9 %  sodium chloride infusion (has no administration in time range)  sodium chloride 0.9 % bolus 1,000  mL (0 mLs Intravenous Stopped 11/23/21 1259)    ED Course/ Medical Decision Making/ A&P                           Medical Decision Making Amount and/or Complexity of Data Reviewed Labs: ordered. Radiology: ordered.  Risk Prescription drug management.  Clinically patient appears dehydrated we will give IV fluids.  We will do a general brain to see any explanation for worsening mental status.  The patient quite alert and cooperative and will follow commands here.  May need further adjustments of medications as per Dr. Gerilyn Pilgrim.  But we will screen to see if there is evidence of urinary tract infection or any significant abnormalities.  Work-up here urine slightly concentrated but significant for any significant dehydration.  Electrolytes without any significant abnormalities.  No leukocytosis hemoglobin is 12.2.  Complete metabolic panel BUN just 24 but GFR is greater than 60 urinalysis negative no urinary tract infection.  CT head without any acute findings.  And chest x-ray without any acute cardiopulmonary process.  No indication for admission.  Discussed with patient's brother.  Who takes care of her.  Patient  eating lunch currently.  He will come to pick her up.  Patient clinically with some dehydration.  Patient received IV fluids here.  To help hydrate her.  But no significant lab abnormalities.  They will discussed with Dr. Gerilyn Pilgrim about medication changes.  Final Clinical Impression(s) / ED Diagnoses Final diagnoses:  Dehydration  Alzheimer's dementia, unspecified dementia severity, unspecified timing of dementia onset, unspecified whether behavioral, psychotic, or mood disturbance or anxiety [facility])    Rx / DC Orders ED Discharge Orders     None         Vanetta Mulders, MD 11/23/21 1328

## 2021-11-23 NOTE — ED Notes (Signed)
EDP made aware of IV removal. EDP stated no need to place another IV. ?

## 2021-11-27 DIAGNOSIS — G301 Alzheimer's disease with late onset: Secondary | ICD-10-CM | POA: Diagnosis not present

## 2021-11-27 DIAGNOSIS — G4701 Insomnia due to medical condition: Secondary | ICD-10-CM | POA: Diagnosis not present

## 2021-11-27 DIAGNOSIS — Z79899 Other long term (current) drug therapy: Secondary | ICD-10-CM | POA: Diagnosis not present

## 2021-11-27 DIAGNOSIS — R441 Visual hallucinations: Secondary | ICD-10-CM | POA: Diagnosis not present

## 2021-11-28 ENCOUNTER — Encounter (HOSPITAL_COMMUNITY): Payer: Self-pay

## 2021-11-28 ENCOUNTER — Other Ambulatory Visit: Payer: Self-pay

## 2021-11-28 ENCOUNTER — Emergency Department (HOSPITAL_COMMUNITY)
Admission: EM | Admit: 2021-11-28 | Discharge: 2021-11-29 | Disposition: A | Discharge status: Home / Self Care | Payer: Medicare HMO | Attending: Emergency Medicine | Admitting: Emergency Medicine

## 2021-11-28 DIAGNOSIS — Z046 Encounter for general psychiatric examination, requested by authority: Secondary | ICD-10-CM | POA: Diagnosis present

## 2021-11-28 DIAGNOSIS — Z20822 Contact with and (suspected) exposure to covid-19: Secondary | ICD-10-CM | POA: Diagnosis not present

## 2021-11-28 DIAGNOSIS — W19XXXA Unspecified fall, initial encounter: Secondary | ICD-10-CM

## 2021-11-28 DIAGNOSIS — F0390 Unspecified dementia without behavioral disturbance: Secondary | ICD-10-CM | POA: Diagnosis not present

## 2021-11-28 DIAGNOSIS — F22 Delusional disorders: Secondary | ICD-10-CM | POA: Diagnosis not present

## 2021-11-28 DIAGNOSIS — R4182 Altered mental status, unspecified: Secondary | ICD-10-CM | POA: Diagnosis not present

## 2021-11-28 DIAGNOSIS — Z7982 Long term (current) use of aspirin: Secondary | ICD-10-CM | POA: Diagnosis not present

## 2021-11-28 DIAGNOSIS — F03C2 Unspecified dementia, severe, with psychotic disturbance: Secondary | ICD-10-CM | POA: Diagnosis not present

## 2021-11-28 LAB — COMPREHENSIVE METABOLIC PANEL
ALT: 36 U/L (ref 0–44)
AST: 33 U/L (ref 15–41)
Albumin: 3.5 g/dL (ref 3.5–5.0)
Alkaline Phosphatase: 63 U/L (ref 38–126)
Anion gap: 6 (ref 5–15)
BUN: 14 mg/dL (ref 8–23)
CO2: 27 mmol/L (ref 22–32)
Calcium: 9.6 mg/dL (ref 8.9–10.3)
Chloride: 106 mmol/L (ref 98–111)
Creatinine, Ser: 0.78 mg/dL (ref 0.44–1.00)
GFR, Estimated: >60 mL/min (ref >60)
Glucose, Bld: 90 mg/dL (ref 70–99)
Potassium: 3.8 mmol/L (ref 3.5–5.1)
Sodium: 139 mmol/L (ref 135–145)
Total Bilirubin: 0.4 mg/dL (ref 0.3–1.2)
Total Protein: 6.9 g/dL (ref 6.5–8.1)

## 2021-11-28 LAB — URINALYSIS, ROUTINE W REFLEX MICROSCOPIC
Bilirubin Urine: NEGATIVE
Glucose, UA: NEGATIVE mg/dL
Hgb urine dipstick: NEGATIVE
Ketones, ur: NEGATIVE mg/dL
Leukocytes,Ua: NEGATIVE
Nitrite: NEGATIVE
Protein, ur: NEGATIVE mg/dL
Specific Gravity, Urine: 1.012 (ref 1.005–1.030)
pH: 6 (ref 5.0–8.0)

## 2021-11-28 LAB — CBC WITH DIFFERENTIAL/PLATELET
Abs Immature Granulocytes: 0.03 10*3/uL (ref 0.00–0.07)
Basophils Absolute: 0 10*3/uL (ref 0.0–0.1)
Basophils Relative: 1 %
Eosinophils Absolute: 0.3 10*3/uL (ref 0.0–0.5)
Eosinophils Relative: 5 %
HCT: 37.9 % (ref 36.0–46.0)
Hemoglobin: 12 g/dL (ref 12.0–15.0)
Immature Granulocytes: 1 %
Lymphocytes Relative: 19 %
Lymphs Abs: 1.1 10*3/uL (ref 0.7–4.0)
MCH: 32.1 pg (ref 26.0–34.0)
MCHC: 31.7 g/dL (ref 30.0–36.0)
MCV: 101.3 fL — ABNORMAL HIGH (ref 80.0–100.0)
Monocytes Absolute: 0.9 10*3/uL (ref 0.1–1.0)
Monocytes Relative: 15 %
Neutro Abs: 3.4 10*3/uL (ref 1.7–7.7)
Neutrophils Relative %: 59 %
Platelets: 203 10*3/uL (ref 150–400)
RBC: 3.74 MIL/uL — ABNORMAL LOW (ref 3.87–5.11)
RDW: 12.1 % (ref 11.5–15.5)
WBC: 5.7 10*3/uL (ref 4.0–10.5)
nRBC: 0 % (ref 0.0–0.2)

## 2021-11-28 LAB — RAPID URINE DRUG SCREEN, HOSP PERFORMED
Amphetamines: NOT DETECTED
Barbiturates: NOT DETECTED
Benzodiazepines: NOT DETECTED
Cocaine: NOT DETECTED
Opiates: NOT DETECTED
Tetrahydrocannabinol: NOT DETECTED

## 2021-11-28 LAB — RESP PANEL BY RT-PCR (FLU A&B, COVID) ARPGX2
Influenza A by PCR: NEGATIVE
Influenza B by PCR: NEGATIVE
SARS Coronavirus 2 by RT PCR: NEGATIVE

## 2021-11-28 LAB — ETHANOL: Alcohol, Ethyl (B): <10 mg/dL (ref <10)

## 2021-11-28 MED ORDER — QUETIAPINE FUMARATE 25 MG PO TABS
25.0000 mg | ORAL_TABLET | Freq: Every day | ORAL | Status: DC
  Administered 2021-11-28: 25 mg via ORAL
  Filled 2021-11-28: qty 1

## 2021-11-28 MED ORDER — HALOPERIDOL 0.5 MG PO TABS: 2.0000 mg | ORAL_TABLET | Freq: Four times a day (QID) | ORAL | Status: DC | PRN

## 2021-11-28 MED ORDER — DONEPEZIL HCL 5 MG PO TABS
10.0000 mg | ORAL_TABLET | Freq: Every day | ORAL | Status: DC
  Administered 2021-11-28: 10 mg via ORAL
  Filled 2021-11-28: qty 2

## 2021-11-28 MED ORDER — LORAZEPAM 0.5 MG PO TABS
0.5000 mg | ORAL_TABLET | Freq: Four times a day (QID) | ORAL | Status: DC | PRN
  Administered 2021-11-28 – 2021-11-29 (×2): 0.5 mg via ORAL
  Filled 2021-11-28 (×2): qty 1

## 2021-11-28 MED ORDER — ASPIRIN EC 81 MG PO TBEC
81.0000 mg | DELAYED_RELEASE_TABLET | Freq: Every day | ORAL | Status: DC
  Administered 2021-11-28 – 2021-11-29 (×2): 81 mg via ORAL
  Filled 2021-11-28 (×2): qty 1

## 2021-11-28 MED ORDER — MEMANTINE HCL 10 MG PO TABS
10.0000 mg | ORAL_TABLET | Freq: Two times a day (BID) | ORAL | Status: DC
  Administered 2021-11-28 – 2021-11-29 (×3): 10 mg via ORAL
  Filled 2021-11-28 (×3): qty 1

## 2021-11-28 MED ORDER — ACETAMINOPHEN 500 MG PO TABS: 500.0000 mg | ORAL_TABLET | Freq: Four times a day (QID) | ORAL | Status: DC | PRN

## 2021-11-28 NOTE — ED Notes (Signed)
Per sitter, Pt was walking around room and picking items off the floor which do not exist.  Pt stumbled over own feet and fell to knees.  EDP called to bedside.  No injuries noted. Pt has no complaints.    ?

## 2021-11-28 NOTE — ED Provider Notes (Incomplete)
?  Physical Exam  ?BP 125/65 (BP Location: Right Arm)   Pulse 68   Temp (!) 97.5 ?F (36.4 ?C)   Resp 18   Ht 5\' 1"  (1.549 m)   Wt 53.7 kg   SpO2 100%   BMI 22.37 kg/m?  ? ?Physical Exam ? ?Procedures  ?Procedures ? ?ED Course / MDM  ? ?Clinical Course as of 11/28/21 1627  ?Tue Nov 28, 2021  ?1448 Patient was witnessed by sitter to have a ground-level fall.  I arrived to the room and she was sitting partially erect, on the floor with her left knee forward.  With direction and assistance she was able to get up on her own and sit in the chair.  She is noted to be picking up things on the floor which are not there and stating that "it needs to be mopped."  She does not appear to have any acute injuries.  Patient has previously been ambulating and requiring constant redirection.  She is somewhat better after receiving some Ativan.  We will add Haldol and as needed restraint. [EW]  ?  ?Clinical Course User Index ?[EW] Mancel Bale, MD  ? ?Medical Decision Making ?Amount and/or Complexity of Data Reviewed ?Labs: ordered. ? ?Risk ?OTC drugs. ?Prescription drug management. ? ? ?*** ? ? ? ? ?

## 2021-11-28 NOTE — ED Notes (Signed)
Patient cannot consent to MSE, confused and IVC'd. ?

## 2021-11-28 NOTE — ED Triage Notes (Signed)
Patient arrived with IVC paperwork that states hx of mental illness with no improvement in symptoms despite medication. Family states that she has been crawling around on the floor at home, believes that her younger brother is her dead husband. States that she has destroyed a bedroom in their home. They found the patient had removed all knives from drawer and placed all over kitchen. Patient has a diagnosis of Dementia.  ?

## 2021-11-28 NOTE — ED Notes (Signed)
ED Provider at bedside. 

## 2021-11-28 NOTE — ED Provider Notes (Signed)
[facility] EMERGENCY DEPARTMENT Provider Note   CSN: [id number] Arrival date & time: 11/28/21  4098     History  Chief Complaint  Patient presents with   Psychiatric Evaluation    Wendy Mccann is a 76 y.o. female.  HPI Patient brought in by EMS after petition by family members under involuntary commitment.  Her niece signed the petition and it states that the patient is confused, thinks that her caregiver is her husband, and continually talks about people who have already deceased.  She has been having odd behavior like crawling on the floor instead of walking.  She saw her neurologist yesterday and was started on Seroquel for these behaviors.  She has progressive symptoms.  She was in the ED, 5 days ago at which time she was evaluated for altered mental status.  Comprehensive testing did not show acute abnormalities.  She had blood work, urinalysis and head CT.  She has known dementia.  She has other family members with dementia.  In the ED today her niece petitioner, and her sister are at the bedside.    Home Medications Prior to Admission medications   Medication Sig Start Date End Date Taking? Authorizing Provider  acetaminophen (TYLENOL) 500 MG tablet Take 500 mg by mouth as needed for mild pain.   Yes [provider]  aspirin EC 81 MG tablet Take 81 mg by mouth daily. Swallow whole.   Yes [provider]  donepezil (ARICEPT) 10 MG tablet Take 1 tablet (10 mg total) by mouth at bedtime. 08/23/20  Yes Glean Salvo, NP  hydroxychloroquine (PLAQUENIL) 200 MG tablet Take 1 tablet 200 mg BID Monday-Friday Patient taking differently: Take 200 mg by mouth See admin instructions. Take 200 mg twice a day  Monday-Friday 04/23/19  Yes Deveshwar, Janalyn Rouse, MD  memantine (NAMENDA) 10 MG tablet Take 1 tablet (10 mg total) by mouth 2 (two) times daily. Please call [phone] to schedule appt. 08/23/20  Yes Glean Salvo, NP  QUEtiapine (SEROQUEL) 25 MG tablet Take 1 tablet by  mouth in the morning and at bedtime. 11/27/21  Yes [provider]  citalopram (CELEXA) 20 MG tablet Take 1 tablet (20 mg total) by mouth daily. Patient not taking: Reported on 11/23/2021 02/22/21   Jacalyn Lefevre, MD  ferrous sulfate 325 (65 FE) MG tablet Take 1 tablet (325 mg total) by mouth daily with breakfast. Patient not taking: Reported on 11/23/2021 01/28/19   Sharee Holster, NP  OLANZapine (ZYPREXA) 5 MG tablet Take 5-10 mg by mouth at bedtime. Patient not taking: Reported on 11/28/2021 11/20/21   [provider]      Allergies    Patient has no known allergies.    Review of Systems   Review of Systems  Physical Exam Updated Vital Signs BP 107/62   Pulse 80   Temp 97.8 F (36.6 C)   Resp 18   Ht  (1.549 m)   Wt 53.7 kg   SpO2 99%   BMI 22.37 kg/m  Physical Exam Vitals and nursing note reviewed.  Constitutional:      General: She is not in acute distress.    Appearance: She is well-developed. She is not ill-appearing or diaphoretic.  HENT:     Head: Normocephalic and atraumatic.     Right Ear: External ear normal.     Left Ear: External ear normal.  Eyes:     Conjunctiva/sclera: Conjunctivae normal.     Pupils: Pupils are equal, round, and  reactive to light.  Neck:     Trachea: Phonation normal.  Cardiovascular:     Rate and Rhythm: Normal rate and regular rhythm.     Heart sounds: Normal heart sounds.  Pulmonary:     Effort: Pulmonary effort is normal.     Breath sounds: Normal breath sounds.  Abdominal:     General: There is no distension.     Palpations: Abdomen is soft.     Tenderness: There is no abdominal tenderness.  Musculoskeletal:        General: Normal range of motion.     Cervical back: Normal range of motion and neck supple.  Skin:    General: Skin is warm and dry.  Neurological:     Mental Status: She is alert and oriented to person, place, and time.     Cranial Nerves: No cranial nerve deficit.     Sensory: No sensory  deficit.     Motor: No abnormal muscle tone.     Coordination: Coordination normal.     Comments: She is confused.  She knows she is in a hospital but not where it is.  Psychiatric:        Mood and Affect: Mood normal.        Behavior: Behavior normal.     Comments: No apparent internal responsiveness.  She is cooperative.    ED Results / Procedures / Treatments   Labs (all labs ordered are listed, but only abnormal results are displayed) Labs Reviewed  CBC WITH DIFFERENTIAL/PLATELET - Abnormal; Notable for the following components:      Result Value   RBC 3.74 (*)    MCV 101.3 (*)    All other components within normal limits  RESP PANEL BY RT-PCR (FLU A&B, COVID) ARPGX2  COMPREHENSIVE METABOLIC PANEL  ETHANOL  RAPID URINE DRUG SCREEN, HOSP PERFORMED    EKG None  Radiology No results found.  Procedures Procedures    Medications Ordered in ED Medications  acetaminophen (TYLENOL) tablet 500 mg (has no administration in time range)  aspirin EC tablet 81 mg (81 mg Oral Given 11/28/21 1314)  donepezil (ARICEPT) tablet 10 mg (has no administration in time range)  memantine (NAMENDA) tablet 10 mg (10 mg Oral Given 11/28/21 1314)  QUEtiapine (SEROQUEL) tablet 25 mg (has no administration in time range)  LORazepam (ATIVAN) tablet 0.5 mg (0.5 mg Oral Given 11/28/21 1314)  haloperidol (HALDOL) tablet 2 mg (has no administration in time range)    ED Course/ Medical Decision Making/ A&P Clinical Course as of 11/28/21 1502  Tue Nov 28, 2021  1448 Patient was witnessed by sitter to have a ground-level fall.  I arrived to the room and she was sitting partially erect, on the floor with her left knee forward.  With direction and assistance she was able to get up on her own and sit in the chair.  She is noted to be picking up things on the floor which are not there and stating that "it needs to be mopped."  She does not appear to have any acute injuries.  Patient has previously been  ambulating and requiring constant redirection.  She is somewhat better after receiving some Ativan.  We will add Haldol and as needed restraint. [EW]    Clinical Course User Index [EW] Mancel Bale, MD                           Medical Decision Making Patient presenting  for altered mental status with known dementia, and lack of improvement as an outpatient.  Recently started on Seroquel, without improvement.  She has known dementia.  She does not have a psychiatrist for treatment at this time.  She has behavioral changes, primarily with delusions, and periods of anger.  Family members have not been able to manage her at home.  She has been placed under involuntary commitment, petition was upheld by me.  Problems Addressed: Fall, initial encounter:    Details: In the ED, no injury Severe dementia with psychotic disturbance, unspecified dementia type [facility]): chronic illness or injury with exacerbation, progression, or side effects of treatment that poses a threat to life or bodily functions  Amount and/or Complexity of Data Reviewed Independent Historian: caregiver    Details: Family members at the bedside Labs: ordered.    Details: CBC, metabolic panel, urinalysis-initial labs normal.  Urinalysis pending, at 3:03 PM on 11/28/2021 ECG/medicine tests: independent interpretation performed.    Details: Reviewed imaging done on last ED visit, 5 days ago.  No acute abnormality of the brain.  This does not need to be repeated at this time. Discussion of management or test interpretation with external provider(s): TTS consultation  Risk OTC drugs. Prescription drug management. Decision regarding hospitalization. Risk Details: Elderly female with dementia, behavioral disturbances, psychotic delusions and frequent falling.  She is at risk for decompensation and self injury.  She requires stabilization.  She will need medicine management by psychiatry, and a safe environment.  She has been placed  under involuntary commitment.  The petition has been upheld.  Critical Care Total time providing critical care: 35 minutes          Final Clinical Impression(s) / ED Diagnoses Final diagnoses:  Severe dementia with psychotic disturbance, unspecified dementia type (HCC)  Fall, initial encounter    Rx / DC Orders ED Discharge Orders     None         Mancel Bale, MD 11/28/21 1505

## 2021-11-28 NOTE — ED Notes (Addendum)
Per sitter, Pt ambulated to the restroom and was provided a specimen cup.  Pt ended up urinating in toilet instead of cup.  Will attempt again later.   ? ?Also, sitter reports Pt seems to be hallucinating.  Pt looked at the wall and stated "it's ok honey, she's helping me."   ?

## 2021-11-28 NOTE — BH Assessment (Signed)
Comprehensive Clinical Assessment (CCA) Note  11/28/2021 Wendy Mccann [id number]  Disposition: Nira Conn, NP, patient meets inpatient criteria. Geropsychiatry recommended. Disposition SW to secure placement. Merry Proud, RN, informed of disposition.  The patient demonstrates the following risk factors for suicide: Chronic risk factors for suicide include: psychiatric disorder of dementia and medical illness dementia . Acute risk factors for suicide include: N/A. Protective factors for this patient include: positive social support, responsibility to others (children, family), coping skills, and hope for the future. Considering these factors, the overall suicide risk at this point appears to be high. Patient is not appropriate for outpatient follow up.  Flowsheet Row ED from 11/28/2021 in [facility] EMERGENCY DEPARTMENT ED from 11/23/2021 in [facility] EMERGENCY DEPARTMENT ED from 05/15/2021 in [facility] EMERGENCY DEPARTMENT  C-SSRS RISK CATEGORY No Risk No Risk No Risk      Wendy Mccann is a 76 year old female presenting under IVC to [facility] due to psychiatric evaluation. Patient has history of dementia. Patient was petitioned by patients niece. Patient denied SI, HI, psychosis and alcohol/drug usage. Patient was in the ED 5 days ago for altered mental status. Patient saw a neurologist on yesterday and was started on Seroquel for present behaviors. Patient denied depressive symptoms. Patient denied prior psych hospitalizations, suicide attempts and self-harming behaviors. Patient reported normal sleep and appetite. Patient reported living with husband, 3 volunteers and 2 nephews. Patient provided limited information. Patient was cooperative during assessment.   PER TRIAGE NOTE 11/28/21: Patient arrived with IVC paperwork that states hx of mental illness with no improvement in symptoms despite medication. Family states that she has been crawling around on the floor at home, believes that her younger  brother is her dead husband. States that she has destroyed a bedroom in their home. They found the patient had removed all knives from drawer and placed all over kitchen. Patient has a diagnosis of Dementia.   Collateral contact, Javier Glazier, sister [phone], Unable to reach, will attempt at later time.  Chief Complaint:  Chief Complaint  Patient presents with   Psychiatric Evaluation   Visit Diagnosis: Hx of dementia   CCA Screening, Triage and Referral (STR)  Patient Reported Information How did you hear about [place]? Family/Friend  What Is the Reason for Your Visit/Call Today? Patient under IVC for bizarre behaviors.  How Long Has This Been Causing You Problems? -- Rich Reining)  What Do You Feel Would Help You the Most Today? -- (uta)   Have You Recently Had Any Thoughts About Hurting Yourself? No  Are You Planning to Commit Suicide/Harm Yourself At This time? No   Have you Recently Had Thoughts About Hurting Someone Karolee Ohs? No  Are You Planning to Harm Someone at This Time? No  Explanation: No data recorded  Have You Used Any Alcohol or Drugs in the Past 24 Hours? No  How Long Ago Did You Use Drugs or Alcohol? No data recorded What Did You Use and How Much? No data recorded  Do You Currently Have a Therapist/Psychiatrist? No  Name of Therapist/Psychiatrist: No data recorded  Have You Been Recently Discharged From Any Office Practice or Programs? No  Explanation of Discharge From Practice/Program: No data recorded    CCA Screening Triage Referral Assessment Type of Contact: Tele-Assessment  Telemedicine Service Delivery:   Is this Initial or Reassessment? Initial Assessment  Date Telepsych consult ordered in [facility]:  11/28/21  Time Telepsych consult ordered in [facility]:  1303  Location of Assessment: [facility]  Provider  Location: [facility]   Collateral Involvement: Javier Glazier, sister, [phone]   Does Patient Have a Court Appointed Legal  Guardian? No data recorded Name and Contact of Legal Guardian: No data recorded If Minor and Not Living with Parent(s), Who has Custody? No data recorded Is CPS involved or ever been involved? Never  Is APS involved or ever been involved? Never   Patient Determined To Be At Risk for Harm To Self or Others Based on Review of Patient Reported Information or Presenting Complaint? No data recorded Method: No data recorded Availability of Means: No data recorded Intent: No data recorded Notification Required: No data recorded Additional Information for Danger to Others Potential: No data recorded Additional Comments for Danger to Others Potential: No data recorded Are There Guns or Other Weapons in Your Home? No data recorded Types of Guns/Weapons: No data recorded Are These Weapons Safely Secured?                            No data recorded Who Could Verify You Are Able To Have These Secured: No data recorded Do You Have any Outstanding Charges, Pending Court Dates, Parole/Probation? No data recorded Contacted To Inform of Risk of Harm To Self or Others: No data recorded   Does Patient Present under Involuntary Commitment? Yes  IVC Papers Initial File Date: 11/28/21   [place] of Residence: [place]   Patient Currently Receiving the Following Services: Not Receiving Services   Determination of Need: Emergent (2 hours)   Options For Referral: Medication Management; Geropsychiatric Facility     CCA Biopsychosocial Patient Reported Schizophrenia/Schizoaffective Diagnosis in Past: No data recorded  Strengths: pleasant   Mental Health Symptoms Depression:   None   Duration of Depressive symptoms:    Mania:   None   Anxiety:    None   Psychosis:   None   Duration of Psychotic symptoms:    Trauma:   None   Obsessions:   None   Compulsions:   None   Inattention:   None   Hyperactivity/Impulsivity:   None   Oppositional/Defiant Behaviors:   None    Emotional Irregularity:   None   Other Mood/Personality Symptoms:  No data recorded   Mental Status Exam Appearance and self-care  Stature:   Average   Weight:   Average weight   Clothing:   Neat/clean   Grooming:   Normal   Cosmetic use:   None   Posture/gait:   Normal   Motor activity:   Not Remarkable   Sensorium  Attention:   Distractible   Concentration:   Normal   Orientation:   Situation; Place; Person   Recall/memory:   Defective in Immediate   Affect and Mood  Affect:   Appropriate   Mood:   Anxious   Relating  Eye contact:   Normal   Facial expression:   Tense   Attitude toward examiner:   Cooperative   Thought and Language  Speech flow:  Normal   Thought content:   Appropriate to Mood and Circumstances   Preoccupation:   None   Hallucinations:   None   Organization:  No data recorded  [employer] of Knowledge:   Average   Intelligence:   Average   Abstraction:   Normal   Judgement:   Impaired   Reality Testing:   Distorted   Insight:   Poor   Decision Making:   Confused  Social Functioning  Social Maturity:   Impulsive   Social Judgement:   Naive   Stress  Stressors:   Other (Comment) (dementia)   Coping Ability:   Overwhelmed   Skill Deficits:   Self-control; Decision making; Self-care; Activities of daily living   Supports:   Family     Religion: Religion/Spirituality Are You A Religious Person?:  Industrial/product designer)  Leisure/Recreation: Leisure / Recreation Do You Have Hobbies?: Yes Leisure and Hobbies: cleaning  Exercise/Diet: Exercise/Diet Do You Exercise?:  (uta) Have You Gained or Lost A Significant Amount of Weight in the Past Six Months?:  (uta) Do You Follow a Special Diet?:  (uta) Do You Have Any Trouble Sleeping?: No   CCA Employment/Education Employment/Work Situation: Employment / Work Situation Employment Situation: Retired  Education: Education Is  Patient Currently Attending School?: No (uta) Did You Attend College?:  (uta) Did You Have An Individualized Education Program (IIEP):  Rich Reining) Did You Have Any Difficulty At School?:  Rich Reining) Patient's Education Has Been Impacted by Current Illness:  (uta)   CCA Family/Childhood History Family and Relationship History: Family history Marital status: Married Number of Years Married:  (uta) What types of issues is patient dealing with in the relationship?:  (uta) Additional relationship information:  (uta) Does patient have children?: No  Childhood History:  Childhood History By whom was/is the patient raised?:  (uta) Did patient suffer any verbal/emotional/physical/sexual abuse as a child?:  (uta) Did patient suffer from severe childhood neglect?:  (uta) Has patient ever been sexually abused/assaulted/raped as an adolescent or adult?:  (uta) Was the patient ever a victim of a crime or a disaster?:  (uta) Witnessed domestic violence?:  (uta) Has patient been affected by 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)  Child/Adolescent Assessment:     CCA Substance Use Alcohol/Drug Use: Alcohol / Drug Use Pain Medications: see MAR Prescriptions: see MAR Over the Counter: see MAR History of alcohol / drug use?: No history of alcohol / drug abuse                         ASAM's:  Six Dimensions of Multidimensional Assessment  Dimension 1:  Acute Intoxication and/or Withdrawal Potential:      Dimension 2:  Biomedical Conditions and Complications:      Dimension 3:  Emotional, Behavioral, or Cognitive Conditions and Complications:     Dimension 4:  Readiness to Change:     Dimension 5:  Relapse, Continued use, or Continued Problem Potential:     Dimension 6:  Recovery/Living Environment:     ASAM Severity Score:    ASAM Recommended Level of Treatment:     Substance use Disorder (SUD)    Recommendations for Services/Supports/Treatments:    Discharge Disposition:     DSM5 Diagnoses: Patient Active Problem List   Diagnosis Date Noted   Vision loss of left eye 05/19/2020   Central retinal artery occlusion of right eye 05/19/2020   Encounter for subsequent annual wellness visit (AWV) in Medicare patient 10/22/2019   Need for immunization against influenza 05/28/2019   Vitamin B12 deficiency 04/28/2019   Joint swelling 04/08/2019   Elevated rheumatoid factor 02/24/2019   B12 deficiency 02/24/2019   Dementia without behavioral disturbance (HCC) 02/07/2019   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   Acute metabolic encephalopathy 01/29/2019   Normocytic anemia 01/23/2019   Hypokalemia 01/22/2019   Volume depletion 01/22/2019   Hyperbilirubinemia 01/22/2019   Generalized weakness 08/12/2016   Orthostatic hypotension 08/12/2016  Abdominal pain 08/12/2016   Cough 08/12/2016   Dehydration, mild 08/12/2016     Referrals to Alternative Service(s): Referred to Alternative Service(s):   Place:   Date:   Time:    Referred to Alternative Service(s):   Place:   Date:   Time:    Referred to Alternative Service(s):   Place:   Date:   Time:    Referred to Alternative Service(s):   Place:   Date:   Time:     Burnetta Sabin, [facility]

## 2021-11-28 NOTE — ED Notes (Signed)
Pt sleeping soundly.  Sitter aware we need a urine sample and we will need to utilize a hat.  ?

## 2021-11-28 NOTE — ED Notes (Signed)
Pt denies SI/HI/AVH.  Sts she just needs to go home.  Pt sts "things were going well and then they brought me up here again." ?

## 2021-11-28 NOTE — ED Notes (Signed)
Pt is now eating her lunch ?

## 2021-11-28 NOTE — ED Notes (Signed)
Faxed to [facility] [phone] and [phone] ?

## 2021-11-28 NOTE — Progress Notes (Signed)
Inpatient [facility] ? ?Pt meets inpatient criteria per Nira Conn, NP. CSW communicated with [facility] and requested that pt be reviewed. Referral was sent to the following facilities;  ? ?Destination ?Service Provider Address Phone Fax  ?[facility]  [address], [place] [address] [phone]  ?[facility]  [address] [place] [place] [address] [phone]  ?[facility]  [address], [place] [address] [phone]  ?[facility]  [address] [place] [address] [phone]  ?[facility]  [address] [place] [address] [phone]  ?[facility]  [address], [place] [address] [phone]  ?[facility]  [address], [place] [address] [phone]  ?[facility]  [address] [place] [address] [phone]  ?[facility]  [address] [place] [address] [phone]  ?[facility]  [address] [place] [place] [address] [phone]  ?[facility]  [address], [place] [address] [phone]  ?[facility]  [address] [place] [address] [phone]  ?[facility]  [address] [place] [address] [phone]  ?[facility]  [address] [place] [address] [phone]  ?[facility]  [address] [place] [address] [phone]  ?[facility]  [address], [place] [address]    ? ? ?Situation ongoing,  CSW will follow up. ? ? ?Maryjean Ka,  MSW, LCSWA ?11/28/2021  @ 10:50 PM ? ?

## 2021-11-28 NOTE — ED Notes (Signed)
Pt took medications w/ a little coaxing.  Pt continues to state that she needs to go home.  Additionally, Pt continues to have conversations and no one is in the room w/ her.  ?

## 2021-11-29 DIAGNOSIS — F03C2 Unspecified dementia, severe, with psychotic disturbance: Secondary | ICD-10-CM

## 2021-11-29 MED ORDER — MIRTAZAPINE 15 MG PO TBDP: 15.0000 mg | ORAL_TABLET | Freq: Every day | ORAL | 1 refills | Status: DC

## 2021-11-29 MED ORDER — MIRTAZAPINE 15 MG PO TBDP: 15.0000 mg | ORAL_TABLET | Freq: Every day | ORAL | 1 refills | Status: AC

## 2021-11-29 MED ORDER — MIRTAZAPINE 15 MG PO TBDP
15.0000 mg | ORAL_TABLET | Freq: Every day | ORAL | Status: DC
  Filled 2021-11-29 (×2): qty 1

## 2021-11-29 NOTE — ED Notes (Signed)
Breakfast at bedside.

## 2021-11-29 NOTE — ED Notes (Signed)
Pt has been accepted to [facility] by Dr Jesse Sans. Facility is supposed to supply [place] with time pt can arrive.  ?

## 2021-11-29 NOTE — ED Notes (Signed)
Verified by telephone that pt is psych cleared and okay to be discharged home from the hospital.  ?

## 2021-11-29 NOTE — ED Notes (Signed)
[facility] will call later today with time pt can arrive to their facility.  ?
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
## 2021-11-29 NOTE — ED Notes (Signed)
TTS in progress 

## 2021-11-29 NOTE — ED Provider Notes (Signed)
Per recommendation of Pamella Pertina Nteun FNP psychiatry the patient has now been psychiatrically cleared for management at home, no longer requiring inpatient psychiatric hospitalization.  The family was updated by the psychiatry provider and are here to pick up the patient.  Psychiatry services recommend initiating Remeron in the evening 15 mg sublingual tablet which is sent to her pharmacy, and continuing zyprexa. ?  ?Terald Sleeperrifan, Khrystyne Arpin J, MD ?11/29/21 1452 ? ?

## 2021-11-29 NOTE — ED Notes (Signed)
Pt resting comfortably at this time. Previous sitter stated that pt did not sleep well last night. Will obtain VS when pt is awake.  ?

## 2021-12-12 DIAGNOSIS — B0223 Postherpetic polyneuropathy: Secondary | ICD-10-CM | POA: Diagnosis not present

## 2021-12-12 DIAGNOSIS — B029 Zoster without complications: Secondary | ICD-10-CM | POA: Diagnosis not present

## 2022-01-10 DIAGNOSIS — G4701 Insomnia due to medical condition: Secondary | ICD-10-CM | POA: Diagnosis not present

## 2022-01-10 DIAGNOSIS — G301 Alzheimer's disease with late onset: Secondary | ICD-10-CM | POA: Diagnosis not present

## 2022-01-10 DIAGNOSIS — Z79899 Other long term (current) drug therapy: Secondary | ICD-10-CM | POA: Diagnosis not present

## 2022-01-10 DIAGNOSIS — R441 Visual hallucinations: Secondary | ICD-10-CM | POA: Diagnosis not present

## 2022-01-12 DIAGNOSIS — M069 Rheumatoid arthritis, unspecified: Secondary | ICD-10-CM | POA: Diagnosis not present

## 2022-01-12 DIAGNOSIS — G309 Alzheimer's disease, unspecified: Secondary | ICD-10-CM | POA: Diagnosis not present

## 2022-02-11 DIAGNOSIS — M069 Rheumatoid arthritis, unspecified: Secondary | ICD-10-CM | POA: Diagnosis not present

## 2022-02-11 DIAGNOSIS — G309 Alzheimer's disease, unspecified: Secondary | ICD-10-CM | POA: Diagnosis not present

## 2022-03-06 DIAGNOSIS — Z79899 Other long term (current) drug therapy: Secondary | ICD-10-CM | POA: Diagnosis not present

## 2022-03-06 DIAGNOSIS — R441 Visual hallucinations: Secondary | ICD-10-CM | POA: Diagnosis not present

## 2022-03-06 DIAGNOSIS — G301 Alzheimer's disease with late onset: Secondary | ICD-10-CM | POA: Diagnosis not present

## 2022-03-06 DIAGNOSIS — G4701 Insomnia due to medical condition: Secondary | ICD-10-CM | POA: Diagnosis not present

## 2022-03-14 DIAGNOSIS — G308 Other Alzheimer's disease: Secondary | ICD-10-CM | POA: Diagnosis not present

## 2022-03-14 DIAGNOSIS — I10 Essential (primary) hypertension: Secondary | ICD-10-CM | POA: Diagnosis not present

## 2022-04-05 DIAGNOSIS — Z23 Encounter for immunization: Secondary | ICD-10-CM | POA: Diagnosis not present

## 2022-04-05 DIAGNOSIS — F411 Generalized anxiety disorder: Secondary | ICD-10-CM | POA: Diagnosis not present

## 2022-04-05 DIAGNOSIS — M069 Rheumatoid arthritis, unspecified: Secondary | ICD-10-CM | POA: Diagnosis not present

## 2022-04-05 DIAGNOSIS — G309 Alzheimer's disease, unspecified: Secondary | ICD-10-CM | POA: Diagnosis not present

## 2022-04-05 DIAGNOSIS — J310 Chronic rhinitis: Secondary | ICD-10-CM | POA: Diagnosis not present

## 2022-04-24 ENCOUNTER — Emergency Department (HOSPITAL_COMMUNITY): Payer: Medicare Other

## 2022-04-24 ENCOUNTER — Emergency Department (HOSPITAL_COMMUNITY)
Admission: EM | Admit: 2022-04-24 | Discharge: 2022-04-27 | Disposition: A | Discharge status: Skilled Nursing Facility | Payer: Medicare Other | Attending: Emergency Medicine | Admitting: Emergency Medicine

## 2022-04-24 ENCOUNTER — Encounter (HOSPITAL_COMMUNITY): Payer: Self-pay | Admitting: *Deleted

## 2022-04-24 ENCOUNTER — Other Ambulatory Visit: Payer: Self-pay

## 2022-04-24 DIAGNOSIS — M79605 Pain in left leg: Secondary | ICD-10-CM

## 2022-04-24 DIAGNOSIS — Z7409 Other reduced mobility: Secondary | ICD-10-CM | POA: Insufficient documentation

## 2022-04-24 DIAGNOSIS — Z1152 Encounter for screening for COVID-19: Secondary | ICD-10-CM | POA: Insufficient documentation

## 2022-04-24 DIAGNOSIS — M25562 Pain in left knee: Secondary | ICD-10-CM | POA: Insufficient documentation

## 2022-04-24 DIAGNOSIS — M25552 Pain in left hip: Secondary | ICD-10-CM | POA: Insufficient documentation

## 2022-04-24 DIAGNOSIS — Z79899 Other long term (current) drug therapy: Secondary | ICD-10-CM | POA: Diagnosis not present

## 2022-04-24 DIAGNOSIS — F0390 Unspecified dementia without behavioral disturbance: Secondary | ICD-10-CM | POA: Diagnosis not present

## 2022-04-24 DIAGNOSIS — Z7982 Long term (current) use of aspirin: Secondary | ICD-10-CM | POA: Diagnosis not present

## 2022-04-24 LAB — MAGNESIUM: Magnesium: 2 mg/dL (ref 1.7–2.4)

## 2022-04-24 LAB — CBC WITH DIFFERENTIAL/PLATELET
Abs Immature Granulocytes: 0.02 10*3/uL (ref 0.00–0.07)
Basophils Absolute: 0 10*3/uL (ref 0.0–0.1)
Basophils Relative: 1 %
Eosinophils Absolute: 0.2 10*3/uL (ref 0.0–0.5)
Eosinophils Relative: 4 %
HCT: 39.2 % (ref 36.0–46.0)
Hemoglobin: 12.5 g/dL (ref 12.0–15.0)
Immature Granulocytes: 0 %
Lymphocytes Relative: 22 %
Lymphs Abs: 1.1 10*3/uL (ref 0.7–4.0)
MCH: 30.9 pg (ref 26.0–34.0)
MCHC: 31.9 g/dL (ref 30.0–36.0)
MCV: 96.8 fL (ref 80.0–100.0)
Monocytes Absolute: 0.7 10*3/uL (ref 0.1–1.0)
Monocytes Relative: 15 %
Neutro Abs: 2.8 10*3/uL (ref 1.7–7.7)
Neutrophils Relative %: 58 %
Platelets: 288 10*3/uL (ref 150–400)
RBC: 4.05 MIL/uL (ref 3.87–5.11)
RDW: 13.2 % (ref 11.5–15.5)
WBC: 4.8 10*3/uL (ref 4.0–10.5)
nRBC: 0 % (ref 0.0–0.2)

## 2022-04-24 LAB — URINALYSIS, ROUTINE W REFLEX MICROSCOPIC
Bilirubin Urine: NEGATIVE
Glucose, UA: NEGATIVE mg/dL
Hgb urine dipstick: NEGATIVE
Ketones, ur: NEGATIVE mg/dL
Leukocytes,Ua: NEGATIVE
Nitrite: NEGATIVE
Protein, ur: NEGATIVE mg/dL
Specific Gravity, Urine: 1.017 (ref 1.005–1.030)
pH: 7 (ref 5.0–8.0)

## 2022-04-24 LAB — COMPREHENSIVE METABOLIC PANEL
ALT: 32 U/L (ref 0–44)
AST: 35 U/L (ref 15–41)
Albumin: 3.4 g/dL — ABNORMAL LOW (ref 3.5–5.0)
Alkaline Phosphatase: 107 U/L (ref 38–126)
Anion gap: 8 (ref 5–15)
BUN: 10 mg/dL (ref 8–23)
CO2: 27 mmol/L (ref 22–32)
Calcium: 9.6 mg/dL (ref 8.9–10.3)
Chloride: 102 mmol/L (ref 98–111)
Creatinine, Ser: 0.63 mg/dL (ref 0.44–1.00)
GFR, Estimated: >60 mL/min (ref >60)
Glucose, Bld: 87 mg/dL (ref 70–99)
Potassium: 3.9 mmol/L (ref 3.5–5.1)
Sodium: 137 mmol/L (ref 135–145)
Total Bilirubin: 0.8 mg/dL (ref 0.3–1.2)
Total Protein: 7.8 g/dL (ref 6.5–8.1)

## 2022-04-24 LAB — RESP PANEL BY RT-PCR (FLU A&B, COVID) ARPGX2
Influenza A by PCR: NEGATIVE
Influenza B by PCR: NEGATIVE
SARS Coronavirus 2 by RT PCR: NEGATIVE

## 2022-04-24 MED ORDER — MIRTAZAPINE 15 MG PO TBDP
15.0000 mg | ORAL_TABLET | Freq: Every day | ORAL | Status: DC
  Administered 2022-04-25 – 2022-04-26 (×2): 15 mg via ORAL
  Filled 2022-04-24 (×7): qty 1

## 2022-04-24 MED ORDER — DONEPEZIL HCL 5 MG PO TABS
10.0000 mg | ORAL_TABLET | Freq: Every day | ORAL | Status: DC
  Administered 2022-04-24 – 2022-04-26 (×3): 10 mg via ORAL
  Filled 2022-04-24 (×3): qty 2

## 2022-04-24 MED ORDER — ONDANSETRON HCL 4 MG PO TABS: 4.0000 mg | ORAL_TABLET | Freq: Three times a day (TID) | ORAL | Status: DC | PRN

## 2022-04-24 MED ORDER — MEMANTINE HCL 10 MG PO TABS
10.0000 mg | ORAL_TABLET | Freq: Two times a day (BID) | ORAL | Status: DC
  Administered 2022-04-24 – 2022-04-27 (×6): 10 mg via ORAL
  Filled 2022-04-24 (×6): qty 1

## 2022-04-24 MED ORDER — QUETIAPINE FUMARATE 100 MG PO TABS
100.0000 mg | ORAL_TABLET | Freq: Every day | ORAL | Status: DC
  Administered 2022-04-24 – 2022-04-26 (×3): 100 mg via ORAL
  Filled 2022-04-24 (×3): qty 1

## 2022-04-24 MED ORDER — ASPIRIN 81 MG PO CHEW
81.0000 mg | CHEWABLE_TABLET | Freq: Every day | ORAL | Status: DC
  Administered 2022-04-24 – 2022-04-27 (×4): 81 mg via ORAL
  Filled 2022-04-24 (×4): qty 1

## 2022-04-24 MED ORDER — IBUPROFEN 400 MG PO TABS: 600.0000 mg | ORAL_TABLET | Freq: Three times a day (TID) | ORAL | Status: DC | PRN

## 2022-04-24 MED ORDER — ALPRAZOLAM 0.5 MG PO TABS
0.5000 mg | ORAL_TABLET | Freq: Three times a day (TID) | ORAL | Status: DC | PRN
  Administered 2022-04-25 – 2022-04-26 (×2): 0.5 mg via ORAL
  Filled 2022-04-24 (×2): qty 1

## 2022-04-24 NOTE — ED Triage Notes (Signed)
Pt brought in by [facility] for c/o left leg pain x 2 weeks  Pt has some swelling to left knee  Cbg 105

## 2022-04-24 NOTE — ED Provider Notes (Signed)
[facility] EMERGENCY DEPARTMENT Provider Note   CSN: [id number] Arrival date & time: 04/24/22  4037     History  Chief Complaint  Patient presents with   Leg Pain    Wendy Mccann is a 76 y.o. female with dementia, history of CRAO, hypokalemia, presents with leg pain .  Patient is brought in by EMS for left leg pain x2 weeks.  Pain appears to include her left hip femur and left knee.  There was no known trauma to the area, there has been no noticeable redness or swelling to the area, she has had no fevers chills, nausea vomiting, chest pain, shortness of breath.,  Or falls.  Patient is cared for by her niece who is at bedside.  Niece states that patient normally "has the wanders" and so she has had to put cameras all over the house, however lately since complaining of the leg pain patient has not been very ambulatory and needs help getting around.  Patient has a remote history of fractures in hardware to the left ankle but no surgeries or history of trauma to the remainder of the extremity.  Niece states that she is not able to safely care for her when she needs this much help and wonders if the patient could be admitted to the hospital in order to be discharged to a rehab facility.  Niece also wonders if she does not have a urinary tract infection causing her to be weak, as patient wears diapers and tends to store things in them.   Leg Pain      Home Medications Prior to Admission medications   Medication Sig Start Date End Date Taking? Authorizing Provider  acetaminophen (TYLENOL) 500 MG tablet Take 500 mg by mouth as needed for mild pain.   Yes [provider]  ALPRAZolam Prudy Feeler) 0.5 MG tablet Take 0.5 mg by mouth every 6 (six) hours as needed. 04/03/22  Yes [provider]  aspirin EC 81 MG tablet Take 81 mg by mouth daily. Swallow whole.   Yes [provider]  donepezil (ARICEPT) 10 MG tablet Take 1 tablet (10 mg total) by mouth at bedtime. 08/23/20   Yes Glean Salvo, NP  memantine (NAMENDA) 10 MG tablet Take 1 tablet (10 mg total) by mouth 2 (two) times daily. Please call [phone] to schedule appt. 08/23/20  Yes Glean Salvo, NP  mirtazapine (REMERON SOL-TAB) 15 MG disintegrating tablet Take 1 tablet (15 mg total) by mouth at bedtime. 11/29/21 04/24/22 Yes Smoot, Shawn Route, PA-C  QUEtiapine (SEROQUEL) 100 MG tablet Take 200 mg by mouth at bedtime. 04/23/22  Yes [provider]  citalopram (CELEXA) 20 MG tablet Take 1 tablet (20 mg total) by mouth daily. Patient not taking: Reported on 11/23/2021 02/22/21   Jacalyn Lefevre, MD  ferrous sulfate 325 (65 FE) MG tablet Take 1 tablet (325 mg total) by mouth daily with breakfast. Patient not taking: Reported on 11/23/2021 01/28/19   Sharee Holster, NP  hydroxychloroquine (PLAQUENIL) 200 MG tablet Take 1 tablet 200 mg BID Monday-Friday Patient not taking: Reported on 04/24/2022 04/23/19   Pollyann Savoy, MD  OLANZapine (ZYPREXA) 5 MG tablet Take 5-10 mg by mouth at bedtime. Patient not taking: Reported on 11/28/2021 11/20/21   [provider]      Allergies    Patient has no known allergies.    Review of Systems   Review of Systems Review of systems negative for fever/chills.  A 10 point review of systems  was performed and is negative unless otherwise reported in HPI.  Physical Exam Updated Vital Signs BP (!) 147/66   Pulse 64   Temp 98.3 F (36.8 C) (Oral)   Resp 16   SpO2 97%  Physical Exam General: Normal appearing female, lying in bed.  HEENT: PERRLA, Sclera anicteric, MMM, trachea midline. Cardiology: RRR, no murmurs/rubs/gallops. BL radial and DP pulses equal bilaterally.  Resp: Normal respiratory rate and effort. CTAB, no wheezes, rhonchi, crackles.  Abd: Soft, non-tender, non-distended. No rebound tenderness or guarding.  GU: Deferred. MSK: Left knee nonerythematous with a very mild effusion.  No warmth to palpation.  No significant tenderness to palpation of the  knee.  Pain with passive range of motion of the hip and knee with no obvious deformities or signs of trauma.  Patient is able to stand on the leg but cannot walk very well.  No wounds or peripheral edema.  Intact DP/PT pulses bilaterally with brisk cap refill. Skin: warm, dry. No rashes or lesions. Back: No CVA tenderness Neuro: A&Ox1, CNs II-XII grossly intact. MAEs. Sensation grossly intact.  Psych: Normal mood and affect.   ED Results / Procedures / Treatments   Labs (all labs ordered are listed, but only abnormal results are displayed) Labs Reviewed  COMPREHENSIVE METABOLIC PANEL - Abnormal; Notable for the following components:      Result Value   Albumin 3.4 (*)    All other components within normal limits  URINE CULTURE  CBC WITH DIFFERENTIAL/PLATELET  MAGNESIUM  URINALYSIS, ROUTINE W REFLEX MICROSCOPIC    Radiology DG Pelvis Portable  Result Date: 04/24/2022 CLINICAL DATA:  Left hip pain EXAM: PORTABLE PELVIS 1-2 VIEWS COMPARISON:  CT abdomen pelvis 01/22/2019 FINDINGS: There is no evidence of pelvic fracture or diastasis. No pelvic bone lesions are seen. External urinary catheter noted IMPRESSION: Negative. Electronically Signed   By: Franchot Gallo M.D.   On: 04/24/2022 13:45   DG Femur Min 2 Views Left  Result Date: 04/24/2022 CLINICAL DATA:  Left leg pain for 2 weeks.  Dementia. EXAM: LEFT FEMUR 2 VIEWS COMPARISON:  None Available. FINDINGS: There is no evidence of fracture or other focal bone lesions. Tricompartmental knee joint space narrowing prominent in the medial tibiofemoral and patellofemoral compartment with prominent osteophytes. Mild hip joint space narrowing with acetabular lip spurring. IMPRESSION: 1. No acute fracture or dislocation. 2. Tricompartmental degenerative changes of the left knee. Electronically Signed   By: Keane Police D.O.   On: 04/24/2022 10:01   DG Knee 2 Views Left  Result Date: 04/24/2022 CLINICAL DATA:  Left knee pain and swelling for 2  weeks. EXAM: LEFT KNEE - 1-2 VIEW COMPARISON:  None Available. FINDINGS: Severe medial joint space narrowing. Moderate peripheral medial and mild peripheral lateral degenerative osteophytosis. Medial and lateral compartment chondrocalcinosis. Mild patellofemoral joint space narrowing and moderate peripheral degenerative osteophytosis. Small joint effusion. Mild chronic enthesopathic change at the quadriceps insertion on the patella. No acute fracture or dislocation. IMPRESSION: 1. Severe medial compartment and mild-to-moderate patellofemoral compartment osteoarthritis. 2. Small joint effusion. Electronically Signed   By: Yvonne Kendall M.D.   On: 04/24/2022 10:01    Procedures Procedures    Medications Ordered in ED Medications  ibuprofen (ADVIL) tablet 600 mg (has no administration in time range)  ondansetron (ZOFRAN) tablet 4 mg (has no administration in time range)  donepezil (ARICEPT) tablet 10 mg (has no administration in time range)  memantine (NAMENDA) tablet 10 mg (has no administration in time range)  mirtazapine (REMERON  SOL-TAB) disintegrating tablet 15 mg (has no administration in time range)  QUEtiapine (SEROQUEL) tablet 100 mg (has no administration in time range)  aspirin chewable tablet 81 mg (81 mg Oral Given 04/24/22 1656)  ALPRAZolam (XANAX) tablet 0.5 mg (has no administration in time range)    ED Course/ Medical Decision Making/ A&P                          Medical Decision Making Amount and/or Complexity of Data Reviewed Labs: ordered. Radiology: ordered. Decision-making details documented in ED Course.  Risk OTC drugs. Prescription drug management.   Patient is overall well-appearing and hemodynamically stable, reportedly at her mental status baseline.  Presents with knee pain and possible placement issue.  Consider for patient's lower extremity pain an occult fracture or dislocation, however no known trauma. No obvious deformities on exam.  Possible the patient  had fallen as she was wondering unbeknownst to them, so obtained x-rays of the left lower extremity which are negative for dislocation or fracture including tibial plateau.  Knee has a slight effusion but nonerythematous, and labs do not demonstrate any white count, patient is hemodynamically stable and afebrile, very low concern for septic arthritis at this time.  Also lower concern for gout.  Consider osteoarthritis as well. Labs including CMP and CBC are unremarkable, UA without any concern for UTI.  Discussed with the niece that without an admittable diagnosis, she cannot be admitted to the hospital in order to be discharged to a rehab facility.  Niece reports that she understands but believes that her aunt needs to be somewhere where she can build up her strength in order to start walking on her own again.  I discussed that we can chat with social work in order to determine what a proper placement location might be for the patient.  We will consult TOC.  I have personally reviewed and interpreted all labs and imaging.   Clinical Course as of 04/24/22 1319  Tue Apr 24, 2022  1019 DG Femur Min 2 Views Left 1. No acute fracture or dislocation. 2. Tricompartmental degenerative changes of the left knee.   [HN]  1019 DG Knee 2 Views Left 1. Severe medial compartment and mild-to-moderate patellofemoral compartment osteoarthritis. 2. Small joint effusion.   [HN]    Clinical Course User Index [HN] Loetta Rough, MD   Consulted to [facility] who will d/w family. Patient at this time does not require admission to the hospital.   Patient is signed out to the oncoming ED physician who is made aware of her history, presentation, exam, workup, and plan.  Plan is for social work to discuss with family and come up with plan for placement.          Final Clinical Impression(s) / ED Diagnoses Final diagnoses:  Left leg pain  Decreased ambulation status    Rx / DC Orders ED Discharge Orders      None        This note was created using dictation software, which may contain spelling or grammatical errors.    Loetta Rough, MD 04/24/22 2052

## 2022-04-24 NOTE — ED Notes (Signed)
Pt very anxious, gave patient pillowcases to fold since she did not do well with the fidget toy, pt content and stated she is happy to help.

## 2022-04-24 NOTE — ED Notes (Signed)
Safety sitter remains at bedside. 

## 2022-04-24 NOTE — ED Notes (Signed)
Pt given dinner bag. Pt's sister assisting pt to eat.

## 2022-04-24 NOTE — Plan of Care (Signed)
  Problem: Acute Rehab PT Goals(only PT should resolve) Goal: Pt Will Go Supine/Side To Sit Outcome: Progressing Flowsheets (Taken 04/24/2022 1603) Pt will go Supine/Side to Sit:  with supervision  with min guard assist Goal: Patient Will Transfer Sit To/From Stand Outcome: Progressing Flowsheets (Taken 04/24/2022 1603) Patient will transfer sit to/from stand:  with supervision  with min guard assist Goal: Pt Will Transfer Bed To Chair/Chair To Bed Outcome: Progressing Flowsheets (Taken 04/24/2022 1603) Pt will Transfer Bed to Chair/Chair to Bed:  min guard assist  with min assist Goal: Pt Will Ambulate Outcome: Progressing Flowsheets (Taken 04/24/2022 1603) Pt will Ambulate:  50 feet  with min guard assist  with minimal assist  with rolling walker   4:03 PM, 04/24/22 Lonell Grandchild, MPT Physical Therapist with [facility] [phone] office [phone] mobile phone

## 2022-04-24 NOTE — Evaluation (Signed)
Physical Therapy Evaluation Patient Details Name: Wendy Mccann MRN: [id number] DOB: October 28, 1945 Today's Date: 04/24/2022  History of Present Illness  Travonna Mccann is a 76 y/o female with c/o  c/o left leg pain x 2 weeks  Clinical Impression  Patient demonstrates slow labored movement for sitting up at bedside, very unsteady on feet with near loss of balance when attempting to walk without AD, required use of RW for safey and limited to a few steps at bedside due to fatigue and mild c/o pain left knee.  Patient required frequent verbal/tactile cueing for completing functional tasks and put back to bed after therapy with family members present at bedside.  Patient will benefit from continued skilled physical therapy in hospital and recommended venue below to increase strength, balance, endurance for safe ADLs and gait.         Recommendations for follow up therapy are one component of a multi-disciplinary discharge planning process, led by the attending physician.  Recommendations may be updated based on patient status, additional functional criteria and insurance authorization.  Follow Up Recommendations Skilled nursing-short term rehab (<3 hours/day) Can patient physically be transported by private vehicle: Yes    Assistance Recommended at Discharge Intermittent Supervision/Assistance  Patient can return home with the following  A lot of help with walking and/or transfers;A lot of help with bathing/dressing/bathroom;Assist for transportation;Help with stairs or ramp for entrance;Assistance with cooking/housework    Equipment Recommendations Rolling walker (2 wheels)  Recommendations for Other Services       Functional Status Assessment Patient has had a recent decline in their functional status and demonstrates the ability to make significant improvements in function in a reasonable and predictable amount of time.     Precautions / Restrictions Precautions Precautions:  Fall Restrictions Weight Bearing Restrictions: No      Mobility  Bed Mobility Overal bed mobility: Needs Assistance Bed Mobility: Supine to Sit, Sit to Supine     Supine to sit: Min assist, Mod assist Sit to supine: Min assist, Mod assist   General bed mobility comments: increased time, labored movement    Transfers Overall transfer level: Needs assistance Equipment used: Rolling walker (2 wheels) Transfers: Sit to/from Stand, Bed to chair/wheelchair/BSC Sit to Stand: Min assist   Step pivot transfers: Min assist       General transfer comment: slow labored movement, frequent verbal/tactile cueing for safety    Ambulation/Gait Ambulation/Gait assistance: Min assist, Mod assist Gait Distance (Feet): 10 Feet Assistive device: Rolling walker (2 wheels) Gait Pattern/deviations: Decreased step length - right, Decreased step length - left, Decreased stride length Gait velocity: decreased     General Gait Details: slow labored cadence limited mostly due to fatigue and requiring constant verbal/tactile cueing for safety  Stairs            Wheelchair Mobility    Modified Rankin (Stroke Patients Only)       Balance Overall balance assessment: Needs assistance Sitting-balance support: Feet supported, No upper extremity supported Sitting balance-Leahy Scale: Fair Sitting balance - Comments: fair/good seated at EOB   Standing balance support: During functional activity, No upper extremity supported Standing balance-Leahy Scale: Poor Standing balance comment: fair using RW                             Pertinent Vitals/Pain Pain Assessment Pain Assessment: Faces Faces Pain Scale: Hurts little more Pain Location: left knee    Home Living Family/patient expects  to be discharged to:: Private residence Living Arrangements: Other relatives Available Help at Discharge: Family;Available PRN/intermittently Type of Home: House Home Access: Stairs to  enter Entrance Stairs-Rails: Left Entrance Stairs-Number of Steps: 2   Home Layout: One level Home Equipment: BSC/3in1;Shower seat      Prior Function Prior Level of Function : Independent/Modified Independent             Mobility Comments: household and short distanced community ambulator ADLs Comments: assisted by family     Hand Dominance        Extremity/Trunk Assessment   Upper Extremity Assessment Upper Extremity Assessment: Generalized weakness    Lower Extremity Assessment Lower Extremity Assessment: Generalized weakness    Cervical / Trunk Assessment Cervical / Trunk Assessment: Normal  Communication   Communication: No difficulties  Cognition Arousal/Alertness: Awake/alert Behavior During Therapy: WFL for tasks assessed/performed Overall Cognitive Status: History of cognitive impairments - at baseline                                          General Comments      Exercises     Assessment/Plan    PT Assessment Patient needs continued PT services  PT Problem List Decreased strength;Decreased activity tolerance;Decreased balance;Decreased mobility       PT Treatment Interventions DME instruction;Gait training;Stair training;Functional mobility training;Therapeutic activities;Therapeutic exercise;Balance training;Patient/family education    PT Goals (Current goals can be found in the Care Plan section)  Acute Rehab PT Goals Patient Stated Goal: return home with family to assist PT Goal Formulation: With patient/family Time For Goal Achievement: 05/08/22 Potential to Achieve Goals: Good    Frequency Min 2X/week     Co-evaluation               AM-PAC PT "6 Clicks" Mobility  Outcome Measure Help needed turning from your back to your side while in a flat bed without using bedrails?: A Little Help needed moving from lying on your back to sitting on the side of a flat bed without using bedrails?: A Little Help needed  moving to and from a bed to a chair (including a wheelchair)?: A Lot Help needed standing up from a chair using your arms (e.g., wheelchair or bedside chair)?: A Lot Help needed to walk in hospital room?: A Lot Help needed climbing 3-5 steps with a railing? : A Lot 6 Click Score: 14    End of Session   Activity Tolerance: Patient tolerated treatment well;Patient limited by fatigue Patient left: in bed;with call bell/phone within reach;with family/visitor present Nurse Communication: Mobility status PT Visit Diagnosis: Unsteadiness on feet (R26.81);Other abnormalities of gait and mobility (R26.89);Muscle weakness (generalized) (M62.81)    Time: 9622-2979 PT Time Calculation (min) (ACUTE ONLY): 28 min   Charges:   PT Evaluation $PT Eval Moderate Complexity: 1 Mod PT Treatments $Therapeutic Activity: 23-37 mins        3:58 PM, 04/24/22 Ocie Bob, MPT Physical Therapist with [facility] [phone] office [phone] mobile phone

## 2022-04-24 NOTE — ED Notes (Signed)
PT currently working with pt.

## 2022-04-24 NOTE — ED Notes (Signed)
Family states it is getting hard to take care of patient at home and they spoke with PCP yesterday and recommended SNF placement for rehab.

## 2022-04-24 NOTE — ED Notes (Addendum)
I spoke with pt's niece, sister and brother and they all report that they are asking for rehab. Pt lives with her brother, Mariann Laster, and her niece, Maylene Roes, is her caregiver the majority of the day while Legrand Como is at work. Sharyn Lull reports pt is "dead weight" and she has to lift her to get her up at all over the last week. She has been c/o pain from left hip to left knee during this time. Sharyn Lull reports the goal is to get her some rehab so that she can regain enough strength to come back home. Sharyn Lull takes care of her daughter, mother and aunt (pt) and reports she isn't able to provide the amount of care she needs at this time due to her decreased mobility over the last week.

## 2022-04-24 NOTE — ED Notes (Addendum)
Wendy Mccann, Air cabin crew assisting pt with eating apple sauce and drinking tea and eating dinner tray

## 2022-04-24 NOTE — ED Notes (Signed)
CSW spoke with pts niece/primary caregiver about SNF referral process. CSW explained that PT has been ordered. Once PT has seen pt and made recommendation CSW will speak with family about options. Family is requesting SNF. TOC to follow for PT recommendation.

## 2022-04-24 NOTE — ED Notes (Signed)
Radiology at bedside

## 2022-04-24 NOTE — NC FL2 (Signed)
[place] LEVEL OF CARE SCREENING TOOL     IDENTIFICATION  Patient Name: Wendy Mccann Birthdate: 1945-12-30 Sex: female Admission Date (Current Location): 04/24/2022  [facility] and [place] Number:  [employer] and Address:  [place] [address], [place]      Provider Number: [id number]  Attending Physician Name and Address:  Audley Hose, MD  Relative Name and Phone Number:       Current Level of Care: Hospital Recommended Level of Care: [place] Prior Approval Number:    Date Approved/Denied:   PASRR Number: [id number]  Discharge Plan: SNF    Current Diagnoses: Patient Active Problem List   Diagnosis Date Noted   Vision loss of left eye 05/19/2020   Central retinal artery occlusion of right eye 05/19/2020   Encounter for subsequent annual wellness visit (AWV) in Medicare patient 10/22/2019   Need for immunization against influenza 05/28/2019   Vitamin B12 deficiency 04/28/2019   Joint swelling 04/08/2019   Elevated rheumatoid factor 02/24/2019   B12 deficiency 02/24/2019   Dementia without behavioral disturbance ([place]) 02/07/2019   Rt Elbow Arthritis 75/04/2584   Acute metabolic encephalopathy 27/78/2423   Normocytic anemia 01/23/2019   Hypokalemia 01/22/2019   Volume depletion 01/22/2019   Hyperbilirubinemia 01/22/2019   Generalized weakness 08/12/2016   Orthostatic hypotension 08/12/2016   Abdominal pain 08/12/2016   Cough 08/12/2016   Dehydration, mild 08/12/2016    Orientation RESPIRATION BLADDER Height & Weight     Self  Normal Continent Weight:   Height:     BEHAVIORAL SYMPTOMS/MOOD NEUROLOGICAL BOWEL NUTRITION STATUS      Continent Diet  AMBULATORY STATUS COMMUNICATION OF NEEDS Skin   Extensive Assist Verbally Normal                       Personal Care Assistance Level of Assistance  Bathing, Feeding, Dressing Bathing Assistance: Limited assistance Feeding  assistance: Independent Dressing Assistance: Limited assistance     Functional Limitations Info  Sight, Hearing, Speech Sight Info: Adequate Hearing Info: Adequate Speech Info: Adequate    SPECIAL CARE FACTORS FREQUENCY  PT (By licensed PT), OT (By licensed OT)     PT Frequency: 5 times weekly OT Frequency: 5 times weekly            Contractures Contractures Info: Not present    Additional Factors Info  Code Status, Allergies Code Status Info: FULL Allergies Info: NKA           Current Medications (04/24/2022):  This is the current hospital active medication list No current facility-administered medications for this encounter.   Current Outpatient Medications  Medication Sig Dispense Refill   acetaminophen (TYLENOL) 500 MG tablet Take 500 mg by mouth as needed for mild pain.     ALPRAZolam (XANAX) 0.5 MG tablet Take 0.5 mg by mouth every 6 (six) hours as needed.     aspirin EC 81 MG tablet Take 81 mg by mouth daily. Swallow whole.     donepezil (ARICEPT) 10 MG tablet Take 1 tablet (10 mg total) by mouth at bedtime. 90 tablet 1   memantine (NAMENDA) 10 MG tablet Take 1 tablet (10 mg total) by mouth 2 (two) times daily. Please call [phone] to schedule appt. 180 tablet 1   mirtazapine (REMERON SOL-TAB) 15 MG disintegrating tablet Take 1 tablet (15 mg total) by mouth at bedtime. 30 tablet 1   QUEtiapine (SEROQUEL) 100 MG tablet Take  200 mg by mouth at bedtime.     citalopram (CELEXA) 20 MG tablet Take 1 tablet (20 mg total) by mouth daily. (Patient not taking: Reported on 11/23/2021) 30 tablet 0   ferrous sulfate 325 (65 FE) MG tablet Take 1 tablet (325 mg total) by mouth daily with breakfast. (Patient not taking: Reported on 11/23/2021) 30 tablet 0   hydroxychloroquine (PLAQUENIL) 200 MG tablet Take 1 tablet 200 mg BID Monday-Friday (Patient not taking: Reported on 04/24/2022) 120 tablet 0   OLANZapine (ZYPREXA) 5 MG tablet Take 5-10 mg by mouth at bedtime. (Patient not  taking: Reported on 11/28/2021)       Discharge Medications: Please see discharge summary for a list of discharge medications.  Relevant Imaging Results:  Relevant Lab Results:   Additional Information SSN: [place] [address] [place]

## 2022-04-25 LAB — URINE CULTURE: Culture: 20000 — AB

## 2022-04-25 NOTE — ED Notes (Signed)
Pt had a full linen change and placed in a gown. Pw changed and peri-care performed

## 2022-04-25 NOTE — Progress Notes (Addendum)
This CSW contactd [place], Admissions Rep at [facility] to request review of the pt's referral. TOC following.   Addend @ 12:59 PM  Christy contacted this CSW requesting an H&P - This CSW informed Alyse Low that there is no H&P due to the pt being in the ED versus Inpatient

## 2022-04-25 NOTE — ED Notes (Signed)
Pt was fed lunch with the assistance of a NT

## 2022-04-25 NOTE — ED Notes (Signed)
Pts room was sweeped and mopped by environmental per family's request

## 2022-04-26 NOTE — ED Notes (Signed)
Pt ate 100% of dinner tray. Nurse notified

## 2022-04-26 NOTE — ED Notes (Addendum)
CSW reached out to both niece and brother, unable to speak with anyone. CSW to attempt call again at later time. TOC to follow.   Addendum 4:20: CSW spoke with pts caregivers to update on bed offers. Family has accepted bed offer at [facility] in [place] for SNF. CSW updated HUB and insurance of this. TOC to follow.   Addendum 4:30: CSW updated by pts family that they would like to see if [facility] could accept pt. CSW spoke to admissions at facility who states she will review pts information and reach back out to CSW.   Addendum 4:48: CSW updated that [facility] is able to offer a bed to pt. CSW updated pts family and they accept bed. CSW to update insurance auth.

## 2022-04-26 NOTE — ED Notes (Signed)
Breakfast tray given to pt 

## 2022-04-26 NOTE — ED Notes (Signed)
Family at bedside assisting with breakfast.

## 2022-04-26 NOTE — ED Notes (Signed)
Called [facility] for pt medication. AC to bring

## 2022-04-26 NOTE — ED Provider Notes (Signed)
Emergency Medicine Observation Re-evaluation Note  Wendy Mccann is a 76 y.o. female, seen on rounds today.  Pt initially presented to the ED for complaints of Leg Pain Currently, the patient is asleep.  Physical Exam  BP 107/86 (BP Location: Right Arm)   Pulse 76   Temp 97.6 F (36.4 C) (Oral)   Resp 16   SpO2 96%  Physical Exam General: Asleep Cardiac: Not assessed, sleep Lungs: Not assessed, sleep  Psych: no assessed, asleep  ED Course / MDM  EKG:   I have reviewed the labs performed to date as well as medications administered while in observation.  No recent changes in the last 24 hours.  Plan  Current plan is for placement.    Sherwood Gambler, MD 04/26/22 [phone]

## 2022-04-27 NOTE — ED Notes (Signed)
Pt is currently ambulating with PT with assistance of rolling walker. Some gait instability noted but pt is ambulating with 1+ assistance.

## 2022-04-27 NOTE — ED Notes (Signed)
Pt seen sitting in chair trying to climb out and has gown off; pt redirected back to chair and gown put on

## 2022-04-27 NOTE — ED Notes (Signed)
CSW updated that insurance Josem Kaufmann has been approved at this time for SNF placement at [facility]. CSW spoke to [place] [facility] who states pt will be going to the [facility] hall and report can be called to [phone]. CSW updated MD and RN of this. CSW updated RN that [facility] can be called for transport if pt can ride in wheelchair. CSW spoke with pts niece/first contact to update of plan for SNF transfer. She is understanding and agreeable to plan. TOC signing off.

## 2022-04-27 NOTE — ED Notes (Signed)
Attempted to call report to [facility] at [phone], no answer.

## 2022-04-27 NOTE — ED Provider Notes (Addendum)
Emergency Medicine Observation Re-evaluation Note  Wendy Mccann is a 76 y.o. female, seen on rounds today.  Pt initially presented to the ED for complaints of Leg Pain Currently, the patient is sleeping awakens easily.  Physical Exam  BP 136/68 (BP Location: Right Arm)   Pulse 79   Temp 99 F (37.2 C) (Oral)   Resp 16   SpO2 98%  Physical Exam General: No distress Cardiac: Regular rate and rhythm Lungs: No increased work of breathing Psych: Calm  ED Course / MDM  EKG:   I have reviewed the labs performed to date as well as medications administered while in observation.  Recent changes in the last 24 hours include none.  Plan  Current plan is for assistance with placement.  Patient going to [facility].    Carmin Muskrat, MD 04/27/22 0973    Carmin Muskrat, MD 04/27/22 [phone]

## 2022-04-27 NOTE — ED Notes (Signed)
Attempted again to call report to [facility] at [phone], no answer.

## 2022-04-27 NOTE — ED Notes (Signed)
Pt was given breakfast tray. Pt ate all her food and drank 480 cc of water and drank a 4 oz apple juice.

## 2022-04-27 NOTE — Progress Notes (Addendum)
Physical Therapy Treatment Patient Details Name: Wendy Mccann MRN: [id number] DOB: [birth year] Today's Date: 04/27/2022   History of Present Illness Wendy Mccann is a 76 y/o female with c/o  c/o left leg pain x 2 weeks    PT Comments    Patient demonstrates labored movement for sitting up at bedside, unsteady on feet during transfer to chair, increased endurance/distance for gait training with slightly labored cadence without loss of balance and limited mostly due to fatigue.  Patient demonstrates fair carryover for completing BLE ROM/strengthening exercises while seated in chair requiring repeated verbal cueing and active assistance for completing hip raises.  Patient tolerated sitting up in chair after therapy - RN aware.  Patient will benefit from continued skilled physical therapy in hospital and recommended venue below to increase strength, balance, endurance for safe ADLs and gait.     Recommendations for follow up therapy are one component of a multi-disciplinary discharge planning process, led by the attending physician.  Recommendations may be updated based on patient status, additional functional criteria and insurance authorization.  Follow Up Recommendations  Skilled nursing-short term rehab (<3 hours/day) Can patient physically be transported by private vehicle: Yes   Assistance Recommended at Discharge Intermittent Supervision/Assistance  Patient can return home with the following A lot of help with walking and/or transfers;A lot of help with bathing/dressing/bathroom;Assist for transportation;Help with stairs or ramp for entrance;Assistance with cooking/housework   Equipment Recommendations  Rolling walker (2 wheels)    Recommendations for Other Services       Precautions / Restrictions Precautions Precautions: Fall Restrictions Weight Bearing Restrictions: No     Mobility  Bed Mobility Overal bed mobility: Needs Assistance Bed Mobility: Supine to Sit      Supine to sit: Min assist, Mod assist     General bed mobility comments: increased time, labored movement    Transfers Overall transfer level: Needs assistance Equipment used: Rolling walker (2 wheels) Transfers: Sit to/from Stand, Bed to chair/wheelchair/BSC Sit to Stand: Min assist   Step pivot transfers: Min assist       General transfer comment: unsteady labored movement    Ambulation/Gait Ambulation/Gait assistance: Min assist Gait Distance (Feet): 50 Feet Assistive device: Rolling walker (2 wheels) Gait Pattern/deviations: Decreased step length - right, Decreased step length - left, Decreased stride length Gait velocity: decreased     General Gait Details: slow labored slightly unsteady cadence without loss of balance, required increased time for making turns and limited mostly due to fatigue   Stairs             Wheelchair Mobility    Modified Rankin (Stroke Patients Only)       Balance Overall balance assessment: Needs assistance Sitting-balance support: Feet supported, No upper extremity supported Sitting balance-Leahy Scale: Fair Sitting balance - Comments: fair/good seated at EOB   Standing balance support: During functional activity, Bilateral upper extremity supported Standing balance-Leahy Scale: Fair Standing balance comment: using RW                            Cognition Arousal/Alertness: Awake/alert Behavior During Therapy: WFL for tasks assessed/performed Overall Cognitive Status: History of cognitive impairments - at baseline                                          Exercises General Exercises - Lower Extremity  Ankle Circles/Pumps: 10 reps, Strengthening, Both, Seated, AROM Long Arc Quad: Strengthening, Both, 10 reps, Seated, AROM Hip Flexion/Marching: AAROM, Strengthening, Both, 10 reps, Seated    General Comments        Pertinent Vitals/Pain Pain Assessment Pain Assessment: No/denies pain     Home Living                          Prior Function            PT Goals (current goals can now be found in the care plan section) Acute Rehab PT Goals Patient Stated Goal: return home after rehab PT Goal Formulation: With patient Time For Goal Achievement: 05/08/22 Potential to Achieve Goals: Good Progress towards PT goals: Progressing toward goals    Frequency    Min 2X/week      PT Plan      Co-evaluation              AM-PAC PT "6 Clicks" Mobility   Outcome Measure  Help needed turning from your back to your side while in a flat bed without using bedrails?: A Little Help needed moving from lying on your back to sitting on the side of a flat bed without using bedrails?: A Little Help needed moving to and from a bed to a chair (including a wheelchair)?: A Lot Help needed standing up from a chair using your arms (e.g., wheelchair or bedside chair)?: A Little Help needed to walk in hospital room?: A Lot Help needed climbing 3-5 steps with a railing? : A Lot 6 Click Score: 15    End of Session   Activity Tolerance: Patient tolerated treatment well;Patient limited by fatigue Patient left: in chair;with call bell/phone within reach Nurse Communication: Mobility status PT Visit Diagnosis: Unsteadiness on feet (R26.81);Other abnormalities of gait and mobility (R26.89);Muscle weakness (generalized) (M62.81)     Time: 1025-1050 PT Time Calculation (min) (ACUTE ONLY): 25 min  Charges:  $Gait Training: 8-22 mins $Therapeutic Exercise: 8-22 mins                     11:18 AM, 04/27/22 Lonell Grandchild, MPT Physical Therapist with [facility] [phone] office [phone] mobile phone

## 2022-05-01 ENCOUNTER — Telehealth: Payer: Self-pay | Admitting: *Deleted

## 2022-05-01 NOTE — Telephone Encounter (Signed)
     Patient  visit on 04/24/2022  at [facility] was for treatment  Have you been able to follow up with your primary care physician? Patient sufferes dementia and the family had no information  The patient was or was not able to obtain any needed medicine or equipment.  Are there diet recommendations that you are having difficulty following?  Patient expresses understanding of discharge instructions and education provided has no other needs at this time.    [place] [phone] [address] , [place] [address] Email : [email or web address]

## 2022-07-19 ENCOUNTER — Emergency Department (HOSPITAL_COMMUNITY): Payer: Medicare Other

## 2022-07-19 ENCOUNTER — Encounter (HOSPITAL_COMMUNITY): Payer: Self-pay

## 2022-07-19 ENCOUNTER — Emergency Department (HOSPITAL_COMMUNITY)
Admission: EM | Admit: 2022-07-19 | Discharge: 2022-07-19 | Disposition: A | Discharge status: Home / Self Care | Payer: Medicare Other | Attending: Emergency Medicine | Admitting: Emergency Medicine

## 2022-07-19 DIAGNOSIS — M25562 Pain in left knee: Secondary | ICD-10-CM | POA: Diagnosis not present

## 2022-07-19 DIAGNOSIS — F0390 Unspecified dementia without behavioral disturbance: Secondary | ICD-10-CM | POA: Insufficient documentation

## 2022-07-19 DIAGNOSIS — Z7982 Long term (current) use of aspirin: Secondary | ICD-10-CM | POA: Diagnosis not present

## 2022-07-19 DIAGNOSIS — R609 Edema, unspecified: Secondary | ICD-10-CM | POA: Diagnosis not present

## 2022-07-19 DIAGNOSIS — M79642 Pain in left hand: Secondary | ICD-10-CM | POA: Diagnosis not present

## 2022-07-19 MED ORDER — IBUPROFEN 400 MG PO TABS
200.0000 mg | ORAL_TABLET | Freq: Once | ORAL | Status: AC
  Administered 2022-07-19: 200 mg via ORAL
  Filled 2022-07-19: qty 1

## 2022-07-19 NOTE — ED Notes (Signed)
Wendy Mccann NT advised pt in wheelchair in [employer], son York Spaniel has been called, left VM [phone]. Will need to recall if pt not picked up in the next 57mins- 1 hr.

## 2022-07-19 NOTE — Discharge Instructions (Signed)
You were seen in the emergency department for a flare of arthritis in your left hand and left knee.  You had x-rays that did not show any obvious fracture.  You can use a warm compress to the area and Tylenol and ibuprofen as needed for pain.  Follow-up with your regular doctor.  Return to the emergency department if any worsening or concerning symptoms

## 2022-07-19 NOTE — ED Triage Notes (Signed)
Per EMS pt coming from home c/o arthritis pain in right hand and right knee cap. Hx of dementia. Per EMS pt unable to answer questions.

## 2022-07-19 NOTE — ED Triage Notes (Signed)
Pt arrives via POV with her brother who is her caregiver who states pt is having an "arthritis flare up" in her left hand, minimal redness noted. Hx rheumatoid arthritis

## 2022-07-19 NOTE — ED Notes (Signed)
Pt in radiology 

## 2022-07-19 NOTE — ED Notes (Signed)
Pt in hallway bed- family at bedside sitting in chair in hallway

## 2022-07-19 NOTE — ED Provider Notes (Signed)
[facility] EMERGENCY DEPARTMENT Provider Note   CSN: [id number] Arrival date & time: 07/19/22  6213     History  Chief Complaint  Patient presents with   Hand Pain    Wendy Mccann is a 77 y.o. female.  Level 5 caveat secondary to dementia.  She is brought in by her brother for evaluation of pain in her left hand and left knee that started yesterday.  No known trauma.  He calls that her arthritis flareup.  No known fevers no trauma.  He tried her regular medication without any improvement  The history is provided by the patient and a relative.  Hand Pain This is a new problem. The current episode started yesterday. The problem has not changed since onset.She has tried rest for the symptoms. The treatment provided no relief.       Home Medications Prior to Admission medications   Medication Sig Start Date End Date Taking? Authorizing Provider  acetaminophen (TYLENOL) 500 MG tablet Take 500 mg by mouth as needed for mild pain.    [provider]  ALPRAZolam Duanne Moron) 0.5 MG tablet Take 0.5 mg by mouth every 6 (six) hours as needed. 04/03/22   [provider]  aspirin EC 81 MG tablet Take 81 mg by mouth daily. Swallow whole.    [provider]  citalopram (CELEXA) 20 MG tablet Take 1 tablet (20 mg total) by mouth daily. Patient not taking: Reported on 11/23/2021 02/22/21   Isla Pence, MD  donepezil (ARICEPT) 10 MG tablet Take 1 tablet (10 mg total) by mouth at bedtime. 08/23/20   Suzzanne Cloud, NP  ferrous sulfate 325 (65 FE) MG tablet Take 1 tablet (325 mg total) by mouth daily with breakfast. Patient not taking: Reported on 11/23/2021 01/28/19   Gerlene Fee, NP  hydroxychloroquine (PLAQUENIL) 200 MG tablet Take 1 tablet 200 mg BID Monday-Friday Patient not taking: Reported on 04/24/2022 04/23/19   Bo Merino, MD  memantine (NAMENDA) 10 MG tablet Take 1 tablet (10 mg total) by mouth 2 (two) times daily. Please call [phone] to schedule appt.  08/23/20   Suzzanne Cloud, NP  mirtazapine (REMERON SOL-TAB) 15 MG disintegrating tablet Take 1 tablet (15 mg total) by mouth at bedtime. 11/29/21 04/24/22  Smoot, Sarah A, PA-C  OLANZapine (ZYPREXA) 5 MG tablet Take 5-10 mg by mouth at bedtime. Patient not taking: Reported on 11/28/2021 11/20/21   [provider]  QUEtiapine (SEROQUEL) 100 MG tablet Take 200 mg by mouth at bedtime. 04/23/22   [provider]      Allergies    Patient has no known allergies.    Review of Systems   Review of Systems  Unable to perform ROS: Dementia    Physical Exam Updated Vital Signs BP 123/83 (BP Location: Right Arm)   Pulse (!) 108   Temp 98.4 F (36.9 C) (Oral)   Resp 18   Ht 5\' 1"  (1.549 m)   Wt 56.2 kg   SpO2 100%   BMI 23.43 kg/m  Physical Exam Vitals and nursing note reviewed.  Constitutional:      General: She is not in acute distress.    Appearance: Normal appearance. She is well-developed.  HENT:     Head: Normocephalic and atraumatic.  Eyes:     Conjunctiva/sclera: Conjunctivae normal.  Cardiovascular:     Rate and Rhythm: Normal rate and regular rhythm.     Heart sounds: No murmur heard. Pulmonary:     Effort: Pulmonary  effort is normal. No respiratory distress.     Breath sounds: Normal breath sounds.  Abdominal:     Palpations: Abdomen is soft.     Tenderness: There is no abdominal tenderness.  Musculoskeletal:        General: Tenderness present. No deformity.     Cervical back: Neck supple.     Comments: She has some redness and tenderness over her left second MCP.  There is also some diffuse tenderness of her left knee.  She has pain with range of motion.  There is no significant swelling and no open wounds.  Distal pulses intact.  Patient fairly noncompliant with neurologic exam.  Skin:    General: Skin is warm and dry.     Capillary Refill: Capillary refill takes less than 2 seconds.  Neurological:     General: No focal deficit present.     Mental  Status: She is alert.     ED Results / Procedures / Treatments   Labs (all labs ordered are listed, but only abnormal results are displayed) Labs Reviewed - No data to display  EKG None  Radiology DG Knee Complete 4 Views Left  Result Date: 07/19/2022 CLINICAL DATA:  Left knee pain, no known injury, initial encounter EXAM: LEFT KNEE - COMPLETE 4+ VIEW COMPARISON:  04/24/2022 FINDINGS: Tricompartmental degenerative changes are again noted worst in the medial joint space. No joint effusion is seen. No fracture or dislocation is noted. IMPRESSION: Degenerative changes without acute abnormality. Electronically Signed   By: Inez Catalina M.D.   On: 07/19/2022 21:21   DG Hand Complete Left  Result Date: 07/19/2022 CLINICAL DATA:  Left hand pain, history of rheumatoid arthritis EXAM: LEFT HAND - COMPLETE 3+ VIEW COMPARISON:  None Available. FINDINGS: Degenerative changes of the first [facility] joint are noted. Mild irregularity of the lunate is noted which may be related to prior trauma. No acute fracture is seen. Periarticular calcifications are noted at the second and third MCP joints. No erosive changes are seen. IMPRESSION: Chronic changes as described without acute fracture Electronically Signed   By: Inez Catalina M.D.   On: 07/19/2022 21:20    Procedures Procedures    Medications Ordered in ED Medications - No data to display  ED Course/ Medical Decision Making/ A&P Clinical Course as of 07/20/22 0932  Thu Jul 19, 2022  2044 On review of prior notes she was in the ED back in October for left leg pain.  It sounds like at that time she was placed in rehab. [MB]  2123 X-rays interpreted by me as arthritic changes on hand and knee.  Awaiting radiology reading. [MB]    Clinical Course User Index [MB] Hayden Rasmussen, MD                           Medical Decision Making Amount and/or Complexity of Data Reviewed Radiology: ordered.  Risk Prescription drug management.   This patient  complains of left hand and left knee pain; this involves an extensive number of treatment Options and is a complaint that carries with it a high risk of complications and morbidity. The differential includes fracture, contusion, arthritis, gout, septic joint I ordered medication oral ibuprofen and reviewed PMP when indicated. I ordered imaging studies which included left hand and left knee x-ray and I independently    visualized and interpreted imaging which showed no acute fracture, does have degenerative changes Additional history obtained from patient's brother/caregiver Previous  records obtained and reviewed in epic patient was seen back in October for right leg pain and transferred to rehab Social determinants considered, patient unable to advocate for self due to dementia Critical Interventions: None  After the interventions stated above, I reevaluated the patient and found patient resting quietly in no distress Admission and further testing considered, no indications for admission at this time.  Recommended symptomatic treatment at home and follow-up with PCP.  Return instructions discussed with brother         Final Clinical Impression(s) / ED Diagnoses Final diagnoses:  Left hand pain  Acute pain of left knee    Rx / DC Orders ED Discharge Orders     None         Hayden Rasmussen, MD 07/20/22 [phone]

## 2022-07-19 NOTE — ED Notes (Signed)
Attempted to call Wendy Mccann, unable to make contact, left VM advising he may return to ED to pick up his mother in [place] lobby

## 2022-08-01 DIAGNOSIS — G309 Alzheimer's disease, unspecified: Secondary | ICD-10-CM | POA: Diagnosis not present

## 2022-08-01 DIAGNOSIS — G4710 Hypersomnia, unspecified: Secondary | ICD-10-CM | POA: Diagnosis not present

## 2022-08-10 DIAGNOSIS — M222X9 Patellofemoral disorders, unspecified knee: Secondary | ICD-10-CM | POA: Diagnosis not present

## 2022-09-01 ENCOUNTER — Emergency Department (HOSPITAL_COMMUNITY): Payer: Medicare Other

## 2022-09-01 ENCOUNTER — Emergency Department (HOSPITAL_COMMUNITY)
Admission: EM | Admit: 2022-09-01 | Discharge: 2022-09-01 | Disposition: A | Discharge status: Home / Self Care | Payer: Medicare Other | Attending: Emergency Medicine | Admitting: Emergency Medicine

## 2022-09-01 ENCOUNTER — Encounter (HOSPITAL_COMMUNITY): Payer: Self-pay

## 2022-09-01 ENCOUNTER — Other Ambulatory Visit: Payer: Self-pay

## 2022-09-01 DIAGNOSIS — Z7982 Long term (current) use of aspirin: Secondary | ICD-10-CM | POA: Insufficient documentation

## 2022-09-01 DIAGNOSIS — S42212A Unspecified displaced fracture of surgical neck of left humerus, initial encounter for closed fracture: Secondary | ICD-10-CM | POA: Insufficient documentation

## 2022-09-01 DIAGNOSIS — W1830XA Fall on same level, unspecified, initial encounter: Secondary | ICD-10-CM | POA: Insufficient documentation

## 2022-09-01 DIAGNOSIS — M4802 Spinal stenosis, cervical region: Secondary | ICD-10-CM | POA: Diagnosis not present

## 2022-09-01 DIAGNOSIS — G309 Alzheimer's disease, unspecified: Secondary | ICD-10-CM | POA: Insufficient documentation

## 2022-09-01 DIAGNOSIS — S0990XA Unspecified injury of head, initial encounter: Secondary | ICD-10-CM | POA: Diagnosis not present

## 2022-09-01 DIAGNOSIS — M47812 Spondylosis without myelopathy or radiculopathy, cervical region: Secondary | ICD-10-CM | POA: Diagnosis not present

## 2022-09-01 DIAGNOSIS — F0280 Dementia in other diseases classified elsewhere without behavioral disturbance: Secondary | ICD-10-CM | POA: Diagnosis not present

## 2022-09-01 DIAGNOSIS — S199XXA Unspecified injury of neck, initial encounter: Secondary | ICD-10-CM | POA: Diagnosis not present

## 2022-09-01 DIAGNOSIS — S4992XA Unspecified injury of left shoulder and upper arm, initial encounter: Secondary | ICD-10-CM | POA: Diagnosis present

## 2022-09-01 DIAGNOSIS — I6523 Occlusion and stenosis of bilateral carotid arteries: Secondary | ICD-10-CM | POA: Diagnosis not present

## 2022-09-01 DIAGNOSIS — H409 Unspecified glaucoma: Secondary | ICD-10-CM | POA: Diagnosis not present

## 2022-09-01 LAB — CBC
HCT: 40.2 % (ref 36.0–46.0)
Hemoglobin: 12.3 g/dL (ref 12.0–15.0)
MCH: 31.1 pg (ref 26.0–34.0)
MCHC: 30.6 g/dL (ref 30.0–36.0)
MCV: 101.5 fL — ABNORMAL HIGH (ref 80.0–100.0)
Platelets: 230 10*3/uL (ref 150–400)
RBC: 3.96 MIL/uL (ref 3.87–5.11)
RDW: 13 % (ref 11.5–15.5)
WBC: 8.7 10*3/uL (ref 4.0–10.5)
nRBC: 0 % (ref 0.0–0.2)

## 2022-09-01 LAB — BASIC METABOLIC PANEL
Anion gap: 11 (ref 5–15)
BUN: 14 mg/dL (ref 8–23)
CO2: 26 mmol/L (ref 22–32)
Calcium: 9.2 mg/dL (ref 8.9–10.3)
Chloride: 103 mmol/L (ref 98–111)
Creatinine, Ser: 0.79 mg/dL (ref 0.44–1.00)
GFR, Estimated: >60 mL/min (ref >60)
Glucose, Bld: 117 mg/dL — ABNORMAL HIGH (ref 70–99)
Potassium: 3.8 mmol/L (ref 3.5–5.1)
Sodium: 140 mmol/L (ref 135–145)

## 2022-09-01 MED ORDER — FENTANYL CITRATE PF 50 MCG/ML IJ SOSY: 50.0000 ug | PREFILLED_SYRINGE | Freq: Once | INTRAMUSCULAR | Status: DC

## 2022-09-01 MED ORDER — FENTANYL CITRATE PF 50 MCG/ML IJ SOSY
50.0000 ug | PREFILLED_SYRINGE | Freq: Once | INTRAMUSCULAR | Status: AC
  Administered 2022-09-01: 50 ug via INTRAMUSCULAR
  Filled 2022-09-01: qty 1

## 2022-09-01 NOTE — ED Triage Notes (Signed)
Pt fell 3 nights ago and has left upper arm pain and bruising.

## 2022-09-01 NOTE — ED Provider Notes (Signed)
[facility] Provider Note   CSN: [id number] Arrival date & time: 09/01/22  1753     History  Chief Complaint  Patient presents with   Arm Injury   HPI Wendy Mccann is a 77 y.o. female with Alzheimer's dementia and anemia presenting for arm injury.  Per brother states that she fell 3 nights ago at home from standing position.  She landed on her left arm.  Unsure if she had a head injury or loss consciousness.  Denies blood thinner use.  States that since she fell on her arm the upper left arm especially has been more swollen, bruising is worse and patient is not moving her arm like she normally does.   Arm Injury      Home Medications Prior to Admission medications   Medication Sig Start Date End Date Taking? Authorizing Provider  acetaminophen (TYLENOL) 500 MG tablet Take 500 mg by mouth as needed for mild pain.    [provider]  ALPRAZolam Duanne Moron) 0.5 MG tablet Take 0.5 mg by mouth every 6 (six) hours as needed. 04/03/22   [provider]  aspirin EC 81 MG tablet Take 81 mg by mouth daily. Swallow whole.    [provider]  citalopram (CELEXA) 20 MG tablet Take 1 tablet (20 mg total) by mouth daily. Patient not taking: Reported on 11/23/2021 02/22/21   Isla Pence, MD  donepezil (ARICEPT) 10 MG tablet Take 1 tablet (10 mg total) by mouth at bedtime. 08/23/20   Suzzanne Cloud, NP  ferrous sulfate 325 (65 FE) MG tablet Take 1 tablet (325 mg total) by mouth daily with breakfast. Patient not taking: Reported on 11/23/2021 01/28/19   Gerlene Fee, NP  hydroxychloroquine (PLAQUENIL) 200 MG tablet Take 1 tablet 200 mg BID Monday-Friday Patient not taking: Reported on 04/24/2022 04/23/19   Bo Merino, MD  memantine (NAMENDA) 10 MG tablet Take 1 tablet (10 mg total) by mouth 2 (two) times daily. Please call [phone] to schedule appt. 08/23/20   Suzzanne Cloud, NP  mirtazapine (REMERON SOL-TAB) 15 MG  disintegrating tablet Take 1 tablet (15 mg total) by mouth at bedtime. 11/29/21 04/24/22  Smoot, Sarah A, PA-C  OLANZapine (ZYPREXA) 5 MG tablet Take 5-10 mg by mouth at bedtime. Patient not taking: Reported on 11/28/2021 11/20/21   [provider]  QUEtiapine (SEROQUEL) 100 MG tablet Take 200 mg by mouth at bedtime. 04/23/22   [provider]      Allergies    Patient has no known allergies.    Review of Systems   Review of Systems  Musculoskeletal:        Left arm pain    Physical Exam Updated Vital Signs BP 117/81 (BP Location: Right Arm)   Pulse 94   Temp 97.7 F (36.5 C) (Oral)   Resp 14   Ht 5' 1"$  (1.549 m)   Wt 56.2 kg   SpO2 98%   BMI 23.41 kg/m  Physical Exam Constitutional:      Appearance: Normal appearance.  HENT:     Head: Normocephalic and atraumatic.     Nose: Nose normal.  Eyes:     Conjunctiva/sclera: Conjunctivae normal.  Pulmonary:     Effort: Pulmonary effort is normal.  Chest:     Comments: No chest wall crepitus, step-off or deformity and no ecchymosis of the chest. Musculoskeletal:     Left shoulder: Swelling present. Decreased range of motion. Normal pulse.  Arms:  Neurological:     Mental Status: She is alert.     Comments: Patient asleep during assessment.  Moving all extremities.  Responsive to painful stimuli.    Psychiatric:        Mood and Affect: Mood normal.     ED Results / Procedures / Treatments   Labs (all labs ordered are listed, but only abnormal results are displayed) Labs Reviewed  BASIC METABOLIC PANEL - Abnormal; Notable for the following components:      Result Value   Glucose, Bld 117 (*)    All other components within normal limits  CBC - Abnormal; Notable for the following components:   MCV 101.5 (*)    All other components within normal limits    EKG None  Radiology CT Head Wo Contrast  Result Date: 09/01/2022 CLINICAL DATA:  Polytrauma, blunt.  Fall 3 nights ago. EXAM: CT HEAD  WITHOUT CONTRAST CT CERVICAL SPINE WITHOUT CONTRAST TECHNIQUE: Multidetector CT imaging of the head and cervical spine was performed following the standard protocol without intravenous contrast. Multiplanar CT image reconstructions of the cervical spine were also generated. RADIATION DOSE REDUCTION: This exam was performed according to the departmental dose-optimization program which includes automated exposure control, adjustment of the mA and/or kV according to patient size and/or use of iterative reconstruction technique. COMPARISON:  MRI head 10/24/2019 FINDINGS: CT HEAD FINDINGS Brain: Cerebral ventricle sizes are concordant with the degree of cerebral volume loss. Patchy and confluent areas of decreased attenuation are noted throughout the deep and periventricular white matter of the cerebral hemispheres bilaterally, compatible with chronic microvascular ischemic disease. No evidence of large-territorial acute infarction. No parenchymal hemorrhage. No mass lesion. No extra-axial collection. No mass effect or midline shift. No hydrocephalus. Basilar cisterns are patent. Vascular: No hyperdense vessel. Atherosclerotic calcifications are present within the cavernous internal carotid arteries. Skull: No acute fracture or focal lesion. Sinuses/Orbits: Paranasal sinuses and mastoid air cells are clear. Bilateral lens replacement. Otherwise the orbits are unremarkable. Other: None. CT CERVICAL SPINE FINDINGS Alignment: Normal. Skull base and vertebrae: Multilevel at least moderate degenerative changes of the spine most prominent at the C4 through C6 levels. Associated severe right C3-C4 osseous neural foraminal stenosis. No associated severe osseous central canal stenosis. No acute fracture. No aggressive appearing focal osseous lesion or focal pathologic process. Soft tissues and spinal canal: No prevertebral fluid or swelling. No visible canal hematoma. Upper chest: Unremarkable. Other: None. IMPRESSION: 1.  No  acute intracranial abnormality. 2. No acute displaced fracture or traumatic listhesis of the cervical spine. Electronically Signed   By: Iven Finn M.D.   On: 09/01/2022 19:27   CT Cervical Spine Wo Contrast  Result Date: 09/01/2022 CLINICAL DATA:  Polytrauma, blunt.  Fall 3 nights ago. EXAM: CT HEAD WITHOUT CONTRAST CT CERVICAL SPINE WITHOUT CONTRAST TECHNIQUE: Multidetector CT imaging of the head and cervical spine was performed following the standard protocol without intravenous contrast. Multiplanar CT image reconstructions of the cervical spine were also generated. RADIATION DOSE REDUCTION: This exam was performed according to the departmental dose-optimization program which includes automated exposure control, adjustment of the mA and/or kV according to patient size and/or use of iterative reconstruction technique. COMPARISON:  MRI head 10/24/2019 FINDINGS: CT HEAD FINDINGS Brain: Cerebral ventricle sizes are concordant with the degree of cerebral volume loss. Patchy and confluent areas of decreased attenuation are noted throughout the deep and periventricular white matter of the cerebral hemispheres bilaterally, compatible with chronic microvascular ischemic disease. No evidence of large-territorial  acute infarction. No parenchymal hemorrhage. No mass lesion. No extra-axial collection. No mass effect or midline shift. No hydrocephalus. Basilar cisterns are patent. Vascular: No hyperdense vessel. Atherosclerotic calcifications are present within the cavernous internal carotid arteries. Skull: No acute fracture or focal lesion. Sinuses/Orbits: Paranasal sinuses and mastoid air cells are clear. Bilateral lens replacement. Otherwise the orbits are unremarkable. Other: None. CT CERVICAL SPINE FINDINGS Alignment: Normal. Skull base and vertebrae: Multilevel at least moderate degenerative changes of the spine most prominent at the C4 through C6 levels. Associated severe right C3-C4 osseous neural foraminal  stenosis. No associated severe osseous central canal stenosis. No acute fracture. No aggressive appearing focal osseous lesion or focal pathologic process. Soft tissues and spinal canal: No prevertebral fluid or swelling. No visible canal hematoma. Upper chest: Unremarkable. Other: None. IMPRESSION: 1.  No acute intracranial abnormality. 2. No acute displaced fracture or traumatic listhesis of the cervical spine. Electronically Signed   By: Iven Finn M.D.   On: 09/01/2022 19:27   DG Humerus Left  Result Date: 09/01/2022 CLINICAL DATA:  Fall.  Injury to humeral head. EXAM: LEFT HUMERUS - 2+ VIEW COMPARISON:  None Available. FINDINGS: Acute comminuted oblique minimally displaced fracture at the surgical neck of the proximal humerus. No significant angulation. The humeral head is appropriately positioned within the glenohumeral joint. No other acute fractures identified. IMPRESSION: Acute comminuted oblique minimally displaced fracture at the surgical neck of the proximal left humerus. Electronically Signed   By: Ileana Roup M.D.   On: 09/01/2022 19:01    Procedures Procedures    Medications Ordered in ED Medications  fentaNYL (SUBLIMAZE) injection 50 mcg (50 mcg Intramuscular Given 09/01/22 1946)    ED Course/ Medical Decision Making/ A&P Clinical Course as of 09/01/22 2103  Sat Sep 01, 2022  1931 CT Cervical Spine Wo Contrast [JR]    Clinical Course User Index [JR] Harriet Pho, PA-C                             Medical Decision Making Amount and/or Complexity of Data Reviewed Labs: ordered. Radiology: ordered. Decision-making details documented in ED Course.  Risk Prescription drug management.    77 year old female who is well-appearing and hemodynamically stable presenting for left arm injury after a fall.  Exam notable for swelling and ecchymosis and limited range of motion the upper left arm.  I personally reviewed x-ray of her left arm which revealed acute comminuted  oblique minimally displaced fracture at the surgical neck of the proximal left humerus.  Treated pain with fentanyl.  After treatment heart rate improved.  I suspect her tachycardia was pain related.  Considered traumatic head and neck injury given fortunately there is no acute findings on CT scans.  Applied shoulder sling.  And advised patient to follow-up with Dr. Aline Brochure of orthopedics early next week.  Advised to treat pain with Tylenol and ibuprofen.         Final Clinical Impression(s) / ED Diagnoses Final diagnoses:  Closed displaced fracture of surgical neck of left humerus, unspecified fracture morphology, initial encounter    Rx / DC Orders ED Discharge Orders     None         Harriet Pho, PA-C 09/01/22 2105    Milton Ferguson, MD 09/02/22 1153

## 2022-09-01 NOTE — Discharge Instructions (Addendum)
Evaluation today revealed that you do have a fracture of the humerus in the upper arm.  Recommend you continue to immobilize shoulder with sling until formally evaluated by orthopedics.  I will call their office on Monday to schedule an appointment soon as possible.  I provided their contact information in your discharge summary.  Recommend Tylenol and ibuprofen as needed for pain.

## 2022-09-06 ENCOUNTER — Ambulatory Visit (INDEPENDENT_AMBULATORY_CARE_PROVIDER_SITE_OTHER): Payer: Medicare Other | Admitting: Orthopedic Surgery

## 2022-09-06 ENCOUNTER — Encounter: Payer: Self-pay | Admitting: Orthopedic Surgery

## 2022-09-06 VITALS — BP 117/81 | Ht 61.0 in | Wt 123.0 lb

## 2022-09-06 DIAGNOSIS — S42202A Unspecified fracture of upper end of left humerus, initial encounter for closed fracture: Secondary | ICD-10-CM | POA: Diagnosis not present

## 2022-09-06 NOTE — Progress Notes (Signed)
Chief Complaint  Patient presents with   Shoulder Injury    Left 08/29/22    Wendy Mccann comes in with new onset fracture left shoulder after getting up and losing her balance falling.  Recent onset dementia.  Patient is here with her family.  I reviewed the hospital x-ray.  It shows a comminuted fracture proximal humerus nondisplaced left shoulder  Patient is in a sling and swath  The patient is really noncommunicative  Physical Exam Constitutional:      Appearance: She is underweight. She is ill-appearing. She is not toxic-appearing.  Neurological:     Mental Status: She is lethargic.    Left shoulder is in a sling she has characteristic swelling and ecchymosis at the elbow anteriorly with bruising I could not test her deltoid sensation she responded to pain  Again imaging shows a nondisplaced proximal humerus fracture of the left shoulder  Assessment and plan.dx Encounter Diagnosis  Name Primary?   Closed fracture of proximal end of left humerus, unspecified fracture morphology, initial encounter Yes    She definitely is not a candidate for surgery and the fracture does not warrant that.  X-rays in 3 weeks  Keep sling on

## 2022-09-10 DIAGNOSIS — M222X9 Patellofemoral disorders, unspecified knee: Secondary | ICD-10-CM | POA: Diagnosis not present

## 2022-09-26 ENCOUNTER — Telehealth: Payer: Self-pay | Admitting: Orthopedic Surgery

## 2022-09-26 NOTE — Telephone Encounter (Signed)
Someone lvm on behalf of this patient today stating to cancel her appointment for 10/31/22, the patient passed away on Oct 24, 2022.

## 2022-10-01 ENCOUNTER — Encounter: Payer: Medicare Other | Admitting: Orthopedic Surgery

## 2022-10-15 DEATH — deceased
